# Patient Record
Sex: Female | Born: 1980
Health system: Southern US, Community
[De-identification: ages and names within clinical notes are randomized; demographics above are authoritative.]

## PROBLEM LIST (undated history)

## (undated) ENCOUNTER — Inpatient Hospital Stay: Payer: Self-pay

## (undated) DIAGNOSIS — K559 Vascular disorder of intestine, unspecified: Secondary | ICD-10-CM

## (undated) DIAGNOSIS — Z9289 Personal history of other medical treatment: Secondary | ICD-10-CM

## (undated) DIAGNOSIS — M4306 Spondylolysis, lumbar region: Secondary | ICD-10-CM

## (undated) DIAGNOSIS — G43109 Migraine with aura, not intractable, without status migrainosus: Secondary | ICD-10-CM

## (undated) DIAGNOSIS — F419 Anxiety disorder, unspecified: Secondary | ICD-10-CM

## (undated) DIAGNOSIS — K859 Acute pancreatitis without necrosis or infection, unspecified: Secondary | ICD-10-CM

## (undated) HISTORY — PX: ARTHROSCOPIC REPAIR ACL: SUR80

## (undated) HISTORY — DX: Personal history of other medical treatment: Z92.89

## (undated) HISTORY — PX: ELBOW SURGERY: SHX618

## (undated) HISTORY — DX: Migraine with aura, not intractable, without status migrainosus: G43.109

## (undated) HISTORY — DX: Anxiety disorder, unspecified: F41.9

## (undated) HISTORY — PX: WISDOM TOOTH EXTRACTION: SHX21

---

## 2006-07-31 ENCOUNTER — Emergency Department: Payer: Self-pay | Admitting: Emergency Medicine

## 2006-08-01 ENCOUNTER — Ambulatory Visit: Payer: Self-pay | Admitting: Emergency Medicine

## 2006-08-03 ENCOUNTER — Emergency Department (HOSPITAL_COMMUNITY): Admission: EM | Admit: 2006-08-03 | Discharge: 2006-08-03 | Payer: Self-pay | Admitting: Emergency Medicine

## 2006-08-17 ENCOUNTER — Emergency Department (HOSPITAL_COMMUNITY): Admission: EM | Admit: 2006-08-17 | Discharge: 2006-08-17 | Payer: Self-pay | Admitting: Emergency Medicine

## 2009-01-01 ENCOUNTER — Inpatient Hospital Stay: Payer: Self-pay

## 2009-01-01 ENCOUNTER — Observation Stay: Payer: Self-pay

## 2011-06-16 DIAGNOSIS — Z9289 Personal history of other medical treatment: Secondary | ICD-10-CM

## 2011-06-16 HISTORY — DX: Personal history of other medical treatment: Z92.89

## 2012-02-11 ENCOUNTER — Inpatient Hospital Stay: Payer: Self-pay | Admitting: Obstetrics & Gynecology

## 2012-02-11 LAB — CBC WITH DIFFERENTIAL/PLATELET
Basophil %: 0.5 %
HCT: 39.7 % (ref 35.0–47.0)
HGB: 14.1 g/dL (ref 12.0–16.0)
Lymphocyte %: 9.9 %
MCHC: 35.5 g/dL (ref 32.0–36.0)
MCV: 95 fL (ref 80–100)
Monocyte #: 1 x10 3/mm — ABNORMAL HIGH (ref 0.2–0.9)
Monocyte %: 7.7 %
RDW: 12.9 % (ref 11.5–14.5)

## 2014-05-08 ENCOUNTER — Ambulatory Visit: Payer: Self-pay | Admitting: Nurse Practitioner

## 2014-06-02 ENCOUNTER — Ambulatory Visit: Payer: Self-pay | Admitting: Oncology

## 2014-06-06 ENCOUNTER — Inpatient Hospital Stay: Payer: Self-pay | Admitting: Internal Medicine

## 2014-06-06 LAB — CBC
HCT: 53.7 % — AB (ref 35.0–47.0)
HGB: 18 g/dL — ABNORMAL HIGH (ref 12.0–16.0)
MCH: 31.9 pg (ref 26.0–34.0)
MCHC: 33.5 g/dL (ref 32.0–36.0)
MCV: 95 fL (ref 80–100)
Platelet: 226 10*3/uL (ref 150–440)
RBC: 5.62 10*6/uL — ABNORMAL HIGH (ref 3.80–5.20)
RDW: 13.4 % (ref 11.5–14.5)
WBC: 20.5 10*3/uL — AB (ref 3.6–11.0)

## 2014-06-06 LAB — COMPREHENSIVE METABOLIC PANEL
ALT: 11 U/L — AB
Albumin: 3.2 g/dL — ABNORMAL LOW (ref 3.4–5.0)
Alkaline Phosphatase: 56 U/L
Anion Gap: 8 (ref 7–16)
BILIRUBIN TOTAL: 1.6 mg/dL — AB (ref 0.2–1.0)
BUN: 25 mg/dL — AB (ref 7–18)
CO2: 24 mmol/L (ref 21–32)
CREATININE: 0.96 mg/dL (ref 0.60–1.30)
Calcium, Total: 8.7 mg/dL (ref 8.5–10.1)
Chloride: 101 mmol/L (ref 98–107)
EGFR (African American): >60
GLUCOSE: 114 mg/dL — AB (ref 65–99)
OSMOLALITY: 272 (ref 275–301)
POTASSIUM: 4.2 mmol/L (ref 3.5–5.1)
SGOT(AST): 19 U/L (ref 15–37)
SODIUM: 133 mmol/L — AB (ref 136–145)
TOTAL PROTEIN: 7.4 g/dL (ref 6.4–8.2)

## 2014-06-06 LAB — PROTIME-INR
INR: 1.2
Prothrombin Time: 15.1 secs — ABNORMAL HIGH (ref 11.5–14.7)

## 2014-06-06 LAB — LIPASE, BLOOD: Lipase: 119 U/L (ref 73–393)

## 2014-06-06 LAB — APTT: Activated PTT: 31.7 secs (ref 23.6–35.9)

## 2014-06-06 LAB — HCG, QUANTITATIVE, PREGNANCY: Beta Hcg, Quant.: <1 m[IU]/mL — ABNORMAL LOW

## 2014-06-07 LAB — URINALYSIS, COMPLETE
BLOOD: NEGATIVE
Bilirubin,UR: NEGATIVE
GLUCOSE, UR: NEGATIVE mg/dL (ref 0–75)
NITRITE: NEGATIVE
Ph: 5 (ref 4.5–8.0)
Protein: NEGATIVE
Specific Gravity: >1.06 (ref 1.003–1.030)
Squamous Epithelial: 10
WBC UR: 45 /HPF (ref 0–5)

## 2014-06-07 LAB — CBC WITH DIFFERENTIAL/PLATELET
BASOS ABS: 0 10*3/uL (ref 0.0–0.1)
BASOS ABS: 0 10*3/uL (ref 0.0–0.1)
BASOS ABS: 0 10*3/uL (ref 0.0–0.1)
BASOS PCT: 0.3 %
Basophil %: 0.4 %
Basophil %: 0.3 %
EOS ABS: 0.1 10*3/uL (ref 0.0–0.7)
EOS ABS: 0.1 10*3/uL (ref 0.0–0.7)
EOS PCT: 1 %
EOS PCT: 1.2 %
Eosinophil #: 0.1 10*3/uL (ref 0.0–0.7)
Eosinophil %: 0.8 %
HCT: 38.8 % (ref 35.0–47.0)
HCT: 38.1 % (ref 35.0–47.0)
HCT: 39.3 % (ref 35.0–47.0)
HGB: 12.8 g/dL (ref 12.0–16.0)
HGB: 12.7 g/dL (ref 12.0–16.0)
HGB: 13.3 g/dL (ref 12.0–16.0)
LYMPHS ABS: 1.6 10*3/uL (ref 1.0–3.6)
LYMPHS PCT: 14.8 %
Lymphocyte #: 1.8 10*3/uL (ref 1.0–3.6)
Lymphocyte #: 1.6 10*3/uL (ref 1.0–3.6)
Lymphocyte %: 14.1 %
Lymphocyte %: 16.6 %
MCH: 31.7 pg (ref 26.0–34.0)
MCH: 32 pg (ref 26.0–34.0)
MCH: 32.5 pg (ref 26.0–34.0)
MCHC: 32.8 g/dL (ref 32.0–36.0)
MCHC: 33.3 g/dL (ref 32.0–36.0)
MCHC: 33.9 g/dL (ref 32.0–36.0)
MCV: 97 fL (ref 80–100)
MCV: 96 fL (ref 80–100)
MCV: 96 fL (ref 80–100)
MONO ABS: 1.2 x10 3/mm — AB (ref 0.2–0.9)
MONO ABS: 1 x10 3/mm — AB (ref 0.2–0.9)
MONO ABS: 0.8 x10 3/mm (ref 0.2–0.9)
MONOS PCT: 8.6 %
Monocyte %: 9.5 %
Monocyte %: 8.7 %
NEUTROS ABS: 9.2 10*3/uL — AB (ref 1.4–6.5)
NEUTROS PCT: 74.6 %
NEUTROS PCT: 75.8 %
NEUTROS PCT: 73.3 %
Neutrophil #: 8.6 10*3/uL — ABNORMAL HIGH (ref 1.4–6.5)
Neutrophil #: 7.2 10*3/uL — ABNORMAL HIGH (ref 1.4–6.5)
PLATELETS: 162 10*3/uL (ref 150–440)
PLATELETS: 166 10*3/uL (ref 150–440)
Platelet: 161 10*3/uL (ref 150–440)
RBC: 4.02 10*6/uL (ref 3.80–5.20)
RBC: 3.97 10*6/uL (ref 3.80–5.20)
RBC: 4.1 10*6/uL (ref 3.80–5.20)
RDW: 13.6 % (ref 11.5–14.5)
RDW: 13.5 % (ref 11.5–14.5)
RDW: 13.4 % (ref 11.5–14.5)
WBC: 12.3 10*3/uL — ABNORMAL HIGH (ref 3.6–11.0)
WBC: 11.3 10*3/uL — AB (ref 3.6–11.0)
WBC: 9.9 10*3/uL (ref 3.6–11.0)

## 2014-06-07 LAB — COMPREHENSIVE METABOLIC PANEL
ALK PHOS: 40 U/L — AB
ALT: 8 U/L — AB
Albumin: 2.1 g/dL — ABNORMAL LOW (ref 3.4–5.0)
Anion Gap: 6 — ABNORMAL LOW (ref 7–16)
BUN: 19 mg/dL — ABNORMAL HIGH (ref 7–18)
Bilirubin,Total: 1.5 mg/dL — ABNORMAL HIGH (ref 0.2–1.0)
CALCIUM: 7.2 mg/dL — AB (ref 8.5–10.1)
CHLORIDE: 108 mmol/L — AB (ref 98–107)
CO2: 25 mmol/L (ref 21–32)
Creatinine: 0.94 mg/dL (ref 0.60–1.30)
Glucose: 78 mg/dL (ref 65–99)
OSMOLALITY: 279 (ref 275–301)
Potassium: 3.6 mmol/L (ref 3.5–5.1)
SGOT(AST): 16 U/L (ref 15–37)
SODIUM: 139 mmol/L (ref 136–145)
Total Protein: 5 g/dL — ABNORMAL LOW (ref 6.4–8.2)

## 2014-06-07 LAB — HEMOGLOBIN: HGB: 13.8 g/dL (ref 12.0–16.0)

## 2014-06-08 LAB — BASIC METABOLIC PANEL
ANION GAP: 7 (ref 7–16)
BUN: 7 mg/dL (ref 7–18)
CALCIUM: 7.4 mg/dL — AB (ref 8.5–10.1)
CHLORIDE: 105 mmol/L (ref 98–107)
CO2: 26 mmol/L (ref 21–32)
CREATININE: 0.86 mg/dL (ref 0.60–1.30)
EGFR (African American): >60
EGFR (Non-African Amer.): >60
Glucose: 85 mg/dL (ref 65–99)
Osmolality: 273 (ref 275–301)
Potassium: 3.4 mmol/L — ABNORMAL LOW (ref 3.5–5.1)
SODIUM: 138 mmol/L (ref 136–145)

## 2014-06-08 LAB — CBC WITH DIFFERENTIAL/PLATELET
BASOS ABS: 0 10*3/uL (ref 0.0–0.1)
Basophil %: 0.2 %
EOS ABS: 0.1 10*3/uL (ref 0.0–0.7)
Eosinophil %: 1.1 %
HCT: 36.8 % (ref 35.0–47.0)
HGB: 12.4 g/dL (ref 12.0–16.0)
Lymphocyte #: 1.2 10*3/uL (ref 1.0–3.6)
Lymphocyte %: 16.6 %
MCH: 32.5 pg (ref 26.0–34.0)
MCHC: 33.6 g/dL (ref 32.0–36.0)
MCV: 97 fL (ref 80–100)
MONO ABS: 0.7 x10 3/mm (ref 0.2–0.9)
Monocyte %: 9.1 %
Neutrophil #: 5.4 10*3/uL (ref 1.4–6.5)
Neutrophil %: 73 %
Platelet: 148 10*3/uL — ABNORMAL LOW (ref 150–440)
RBC: 3.8 10*6/uL (ref 3.80–5.20)
RDW: 13.3 % (ref 11.5–14.5)
WBC: 7.5 10*3/uL (ref 3.6–11.0)

## 2014-06-08 LAB — HEPATIC FUNCTION PANEL A (ARMC)
Albumin: 2.3 g/dL — ABNORMAL LOW (ref 3.4–5.0)
Alkaline Phosphatase: 38 U/L — ABNORMAL LOW
BILIRUBIN TOTAL: 0.8 mg/dL (ref 0.2–1.0)
Bilirubin, Direct: 0.1 mg/dL (ref 0.0–0.2)
SGOT(AST): 12 U/L — ABNORMAL LOW (ref 15–37)
SGPT (ALT): 8 U/L — ABNORMAL LOW
Total Protein: 5.5 g/dL — ABNORMAL LOW (ref 6.4–8.2)

## 2014-06-09 LAB — BASIC METABOLIC PANEL
Anion Gap: 6 — ABNORMAL LOW (ref 7–16)
BUN: 8 mg/dL (ref 7–18)
CALCIUM: 7.8 mg/dL — AB (ref 8.5–10.1)
CO2: 28 mmol/L (ref 21–32)
Chloride: 108 mmol/L — ABNORMAL HIGH (ref 98–107)
Creatinine: 0.85 mg/dL (ref 0.60–1.30)
EGFR (African American): >60
EGFR (Non-African Amer.): >60
Glucose: 84 mg/dL (ref 65–99)
OSMOLALITY: 281 (ref 275–301)
POTASSIUM: 3.4 mmol/L — AB (ref 3.5–5.1)
SODIUM: 142 mmol/L (ref 136–145)

## 2014-06-09 LAB — URINE CULTURE

## 2014-06-09 LAB — SEDIMENTATION RATE: Erythrocyte Sed Rate: 46 mm/hr — ABNORMAL HIGH (ref 0–20)

## 2014-06-10 LAB — BASIC METABOLIC PANEL
ANION GAP: 8 (ref 7–16)
BUN: 8 mg/dL (ref 7–18)
CHLORIDE: 106 mmol/L (ref 98–107)
Calcium, Total: 8.1 mg/dL — ABNORMAL LOW (ref 8.5–10.1)
Co2: 28 mmol/L (ref 21–32)
Creatinine: 0.94 mg/dL (ref 0.60–1.30)
EGFR (African American): >60
Glucose: 79 mg/dL (ref 65–99)
OSMOLALITY: 280 (ref 275–301)
Potassium: 3.4 mmol/L — ABNORMAL LOW (ref 3.5–5.1)
Sodium: 142 mmol/L (ref 136–145)

## 2014-06-11 LAB — CULTURE, BLOOD (SINGLE)

## 2014-06-12 LAB — CBC WITH DIFFERENTIAL/PLATELET
BASOS PCT: 1 %
Basophil #: 0 10*3/uL (ref 0.0–0.1)
EOS PCT: 5.6 %
Eosinophil #: 0.3 10*3/uL (ref 0.0–0.7)
HCT: 37.1 % (ref 35.0–47.0)
HGB: 12.8 g/dL (ref 12.0–16.0)
LYMPHS ABS: 1.3 10*3/uL (ref 1.0–3.6)
Lymphocyte %: 26.6 %
MCH: 32.2 pg (ref 26.0–34.0)
MCHC: 34.5 g/dL (ref 32.0–36.0)
MCV: 93 fL (ref 80–100)
MONO ABS: 0.8 x10 3/mm (ref 0.2–0.9)
Monocyte %: 16.3 %
Neutrophil #: 2.4 10*3/uL (ref 1.4–6.5)
Neutrophil %: 50.5 %
PLATELETS: 283 10*3/uL (ref 150–440)
RBC: 3.97 10*6/uL (ref 3.80–5.20)
RDW: 12.8 % (ref 11.5–14.5)
WBC: 4.7 10*3/uL (ref 3.6–11.0)

## 2014-06-13 ENCOUNTER — Ambulatory Visit: Payer: Self-pay | Admitting: Oncology

## 2014-06-13 LAB — COMPREHENSIVE METABOLIC PANEL
ALK PHOS: 39 U/L — AB
ANION GAP: 5 — AB (ref 7–16)
Albumin: 2.5 g/dL — ABNORMAL LOW (ref 3.4–5.0)
BILIRUBIN TOTAL: 0.4 mg/dL (ref 0.2–1.0)
BUN: 10 mg/dL (ref 7–18)
CALCIUM: 8.1 mg/dL — AB (ref 8.5–10.1)
CHLORIDE: 106 mmol/L (ref 98–107)
CO2: 29 mmol/L (ref 21–32)
CREATININE: 1.03 mg/dL (ref 0.60–1.30)
Glucose: 86 mg/dL (ref 65–99)
OSMOLALITY: 278 (ref 275–301)
POTASSIUM: 3.7 mmol/L (ref 3.5–5.1)
SGOT(AST): 12 U/L — ABNORMAL LOW (ref 15–37)
SGPT (ALT): 10 U/L — ABNORMAL LOW
SODIUM: 140 mmol/L (ref 136–145)
Total Protein: 6 g/dL — ABNORMAL LOW (ref 6.4–8.2)

## 2014-06-13 LAB — PHOSPHORUS: Phosphorus: 3.9 mg/dL (ref 2.5–4.9)

## 2014-06-13 LAB — MAGNESIUM: MAGNESIUM: 2.1 mg/dL

## 2014-06-18 ENCOUNTER — Inpatient Hospital Stay: Payer: Self-pay | Admitting: Internal Medicine

## 2014-06-18 LAB — COMPREHENSIVE METABOLIC PANEL
ALT: 38 U/L
AST: 47 U/L — AB (ref 15–37)
Albumin: 3.9 g/dL (ref 3.4–5.0)
Alkaline Phosphatase: 53 U/L
Anion Gap: 9 (ref 7–16)
BILIRUBIN TOTAL: 0.3 mg/dL (ref 0.2–1.0)
BUN: 16 mg/dL (ref 7–18)
CALCIUM: 9.6 mg/dL (ref 8.5–10.1)
CHLORIDE: 102 mmol/L (ref 98–107)
CO2: 28 mmol/L (ref 21–32)
CREATININE: 0.96 mg/dL (ref 0.60–1.30)
EGFR (African American): >60
EGFR (Non-African Amer.): >60
Glucose: 95 mg/dL (ref 65–99)
OSMOLALITY: 279 (ref 275–301)
Potassium: 4.1 mmol/L (ref 3.5–5.1)
Sodium: 139 mmol/L (ref 136–145)
TOTAL PROTEIN: 8.4 g/dL — AB (ref 6.4–8.2)

## 2014-06-18 LAB — CBC
HCT: 47.3 % — AB (ref 35.0–47.0)
HGB: 16.2 g/dL — ABNORMAL HIGH (ref 12.0–16.0)
MCH: 31.8 pg (ref 26.0–34.0)
MCHC: 34.3 g/dL (ref 32.0–36.0)
MCV: 93 fL (ref 80–100)
Platelet: 472 10*3/uL — ABNORMAL HIGH (ref 150–440)
RBC: 5.11 10*6/uL (ref 3.80–5.20)
RDW: 13 % (ref 11.5–14.5)
WBC: 7.9 10*3/uL (ref 3.6–11.0)

## 2014-06-18 LAB — URINALYSIS, COMPLETE
Bacteria: NONE SEEN
Bilirubin,UR: NEGATIVE
Blood: NEGATIVE
Glucose,UR: NEGATIVE mg/dL (ref 0–75)
Ketone: NEGATIVE
LEUKOCYTE ESTERASE: NEGATIVE
Nitrite: NEGATIVE
PH: 7 (ref 4.5–8.0)
PROTEIN: NEGATIVE
Specific Gravity: 1.005 (ref 1.003–1.030)
Squamous Epithelial: 1
WBC UR: NONE SEEN /HPF (ref 0–5)

## 2014-06-18 LAB — LIPASE, BLOOD: LIPASE: 1030 U/L — AB (ref 73–393)

## 2014-06-19 LAB — LIPASE, BLOOD: LIPASE: 595 U/L — AB (ref 73–393)

## 2014-06-20 LAB — LIPASE, BLOOD: LIPASE: 625 U/L — AB (ref 73–393)

## 2014-06-20 LAB — TRIGLYCERIDES: Triglycerides: 129 mg/dL (ref 0–200)

## 2014-06-21 LAB — LIPASE, BLOOD: LIPASE: 648 U/L — AB (ref 73–393)

## 2014-06-22 LAB — BASIC METABOLIC PANEL
ANION GAP: 11 (ref 7–16)
BUN: 9 mg/dL (ref 7–18)
CO2: 22 mmol/L (ref 21–32)
CREATININE: 0.85 mg/dL (ref 0.60–1.30)
Calcium, Total: 8.5 mg/dL (ref 8.5–10.1)
Chloride: 105 mmol/L (ref 98–107)
Glucose: 73 mg/dL (ref 65–99)
OSMOLALITY: 273 (ref 275–301)
Potassium: 3.9 mmol/L (ref 3.5–5.1)
SODIUM: 138 mmol/L (ref 136–145)

## 2014-06-23 LAB — CBC WITH DIFFERENTIAL/PLATELET
BASOS PCT: 0.4 %
Basophil #: 0 10*3/uL (ref 0.0–0.1)
EOS ABS: 0 10*3/uL (ref 0.0–0.7)
EOS PCT: 0.5 %
HCT: 36.4 % (ref 35.0–47.0)
HGB: 12.6 g/dL (ref 12.0–16.0)
LYMPHS PCT: 9.1 %
Lymphocyte #: 0.8 10*3/uL — ABNORMAL LOW (ref 1.0–3.6)
MCH: 34.3 pg — ABNORMAL HIGH (ref 26.0–34.0)
MCHC: 34.7 g/dL (ref 32.0–36.0)
MCV: 99 fL (ref 80–100)
MONOS PCT: 7.4 %
Monocyte #: 0.6 x10 3/mm (ref 0.2–0.9)
NEUTROS ABS: 6.9 10*3/uL — AB (ref 1.4–6.5)
NEUTROS PCT: 82.6 %
Platelet: 317 10*3/uL (ref 150–440)
RBC: 3.69 10*6/uL — AB (ref 3.80–5.20)
RDW: 12.3 % (ref 11.5–14.5)
WBC: 8.4 10*3/uL (ref 3.6–11.0)

## 2014-06-23 LAB — SEDIMENTATION RATE: ERYTHROCYTE SED RATE: 48 mm/h — AB (ref 0–20)

## 2014-06-23 LAB — WBC: WBC: 4.7 10*3/uL (ref 3.6–11.0)

## 2014-06-24 LAB — LIPASE, BLOOD: Lipase: 780 U/L — ABNORMAL HIGH (ref 73–393)

## 2014-06-25 LAB — COMPREHENSIVE METABOLIC PANEL
ANION GAP: 6 — AB (ref 7–16)
Albumin: 2.9 g/dL — ABNORMAL LOW (ref 3.4–5.0)
Alkaline Phosphatase: 33 U/L — ABNORMAL LOW
BILIRUBIN TOTAL: 0.6 mg/dL (ref 0.2–1.0)
BUN: 7 mg/dL (ref 7–18)
CHLORIDE: 105 mmol/L (ref 98–107)
CO2: 29 mmol/L (ref 21–32)
Calcium, Total: 8.6 mg/dL (ref 8.5–10.1)
Creatinine: 0.86 mg/dL (ref 0.60–1.30)
EGFR (Non-African Amer.): >60
Glucose: 71 mg/dL (ref 65–99)
OSMOLALITY: 276 (ref 275–301)
POTASSIUM: 3.8 mmol/L (ref 3.5–5.1)
SGOT(AST): 32 U/L (ref 15–37)
SGPT (ALT): 25 U/L
Sodium: 140 mmol/L (ref 136–145)
TOTAL PROTEIN: 6 g/dL — AB (ref 6.4–8.2)

## 2014-06-25 LAB — LIPASE, BLOOD: Lipase: 748 U/L — ABNORMAL HIGH (ref 73–393)

## 2014-06-26 LAB — LIPASE, BLOOD: Lipase: 840 U/L — ABNORMAL HIGH (ref 73–393)

## 2014-06-27 LAB — COMPREHENSIVE METABOLIC PANEL
ALT: 14 U/L (ref 14–63)
AST: 25 U/L (ref 15–37)
Albumin: 2.6 g/dL — ABNORMAL LOW (ref 3.4–5.0)
Alkaline Phosphatase: 33 U/L — ABNORMAL LOW (ref 46–116)
Anion Gap: 6 — ABNORMAL LOW (ref 7–16)
BUN: 6 mg/dL — ABNORMAL LOW (ref 7–18)
Bilirubin,Total: 0.4 mg/dL (ref 0.2–1.0)
CHLORIDE: 108 mmol/L — AB (ref 98–107)
CO2: 30 mmol/L (ref 21–32)
Calcium, Total: 8.6 mg/dL (ref 8.5–10.1)
Creatinine: 0.96 mg/dL (ref 0.60–1.30)
Glucose: 92 mg/dL (ref 65–99)
Osmolality: 284 (ref 275–301)
Potassium: 4 mmol/L (ref 3.5–5.1)
Sodium: 144 mmol/L (ref 136–145)
Total Protein: 5.6 g/dL — ABNORMAL LOW (ref 6.4–8.2)

## 2014-06-27 LAB — LIPASE, BLOOD: Lipase: 721 U/L — ABNORMAL HIGH (ref 73–393)

## 2014-06-27 LAB — PREGNANCY, URINE: Pregnancy Test, Urine: NEGATIVE m[IU]/mL

## 2014-06-27 LAB — CULTURE, BLOOD (SINGLE)

## 2014-07-03 ENCOUNTER — Ambulatory Visit: Payer: Self-pay | Admitting: Oncology

## 2014-09-25 LAB — SURGICAL PATHOLOGY

## 2014-10-01 NOTE — Consult Note (Signed)
Pt seen.  See consult note from Dawson Bills.  Pt bowel sounds are good at this time, mild diffuse tenderness, afebrile and WBCD coming down.  Will follow with you.   Electronic Signatures: Manya Silvas (MD) (Signed on 06-Jan-16 19:45)  Authored   Last Updated: 06-Jan-16 19:45 by Manya Silvas (MD)

## 2014-10-01 NOTE — Consult Note (Signed)
Pt doing better, is going home, will contact me next week for follow up.  Electronic Signatures: Manya Silvas (MD)  (Signed on 15-Jan-16 16:47)  Authored  Last Updated: 15-Jan-16 16:47 by Manya Silvas (MD)

## 2014-10-01 NOTE — Consult Note (Signed)
CHIEF COMPLAINT and HISTORY:  Subjective/Chief Complaint recurrent abdominal pain   History of Present Illness Patient who was admitted for over a week and discharged last week with ischemic colitis.  Admitted with recurrent abdominal pain and now with elevated lipase consistent with chemical pancreatitis.  She feels a little better with hydration since admission.  WBC normal.  Bowels more regular than previous bout.  Had been eating OK at discharge.  Had a RUQ [place] which was unrevealing.  Lipase is down to 500s from 1000 on admission.  We are asked again to assess perfusion as a possible issue.   PAST MEDICAL/SURGICAL HISTORY:  Past Medical History:   Ischemic Colitis:    Pancreatitis:    Knee Surgery - Left:    Other, see comments:   ALLERGIES:  Allergies:  Sulfa drugs: Swelling  HOME MEDICATIONS:  Home Medications: Medication Instructions Status  acetaminophen-HYDROcodone 325 mg-5 mg oral tablet 1 tab(s) orally every 4 hours as needed for moderate pain Active  Prenatal Multivitamins 1 tab(s) orally once a day Active   Family and Social History:  Family History great grandmother with aneurysms, grandmother with Wegner's and ESRD   Social History negative tobacco, negative ETOH, negative Illicit drugs   Place of Living Home   Review of Systems:  Subjective/Chief Complaint No TIA/stroke/seizure No heat or cold intolerance No dysuria/hematuria No blurry or double vision No tinnitus or ear pain No rashes or ulcer No suicidal ideation or psychosis No signs of bleeding or easy bruising No SOB/DOE, orthopnea, or sputum No palpitations or chest pain Positive for abdominal pain No joint pain or joint swelling No fever or chills No unintentional weight loss or gain   Medications/Allergies Reviewed Medications/Allergies reviewed   Physical Exam:  GEN well developed, well nourished, no acute distress   HEENT pink conjunctivae, hearing intact to voice   NECK No masses   trachea midline   RESP normal resp effort  no use of accessory muscles   CARD No LE edema  no JVD   VASCULAR ACCESS none   ABD positive tenderness  soft   LYMPH negative neck, negative axillae   EXTR negative cyanosis/clubbing, negative edema   SKIN No rashes, No ulcers, skin turgor good   NEURO cranial nerves intact, follows commands, motor/sensory function intact   PSYCH alert, A+O to time, place, person, good insight   LABS:  Laboratory Results: Hepatic:    17-Jan-16 11:16, Comprehensive Metabolic Panel  Bilirubin, Total 0.3  Alkaline Phosphatase 53  46-116  NOTE: New Reference Range  12/20/13  SGPT (ALT) 38  14-63  NOTE: New Reference Range  12/20/13  SGOT (AST) 47  Total Protein, Serum 8.4  Albumin, Serum 3.9  Routine Chem:  Glucose, Serum 95  BUN 16  Creatinine (comp) 0.96  Sodium, Serum 139  Potassium, Serum 4.1  Chloride, Serum 102  CO2, Serum 28  Calcium (Total), Serum 9.6  Osmolality (calc) 279  eGFR (African American) >60  eGFR (Non-African American) >60  eGFR values <56mL/min/1.73 m2 may be an indication of chronic  kidney disease (CKD).  Calculated eGFR, using the MRDR Study equation, is useful in   patients with stable renal function.  The eGFR calculation will not be reliable in acutely ill patients  when serum creatinine is changing rapidly. It is not useful in  patients on dialysis. The eGFR calculation may not be applicable  to patients at the low and high extremes of body sizes, pregnant  women, and vegetarians.  Anion Gap 9  17-Jan-16 11:16, Lipase  Lipase 1030  Result(s) reported on 18 Jun 2014 at 11:43AM.    18-Jan-16 04:05, Lipase  Lipase 595  Result(s) reported on 19 Jun 2014 at 04:47AM.  Routine UA:    17-Jan-16 12:10, Urinalysis  Color (UA) Straw  Clarity (UA) Clear  Glucose (UA) Negative  Bilirubin (UA) Negative  Ketones (UA) Negative  Specific Gravity (UA) 1.005  Blood (UA) Negative  pH (UA) 7.0  Protein (UA)  Negative  Nitrite (UA) Negative  Leukocyte Esterase (UA) Negative  Result(s) reported on 18 Jun 2014 at 12:29PM.  RBC (UA) <1 /HPF  WBC (UA)   NONE SEEN  Bacteria (UA)   NONE SEEN  Epithelial Cells (UA) 1 /HPF  Result(s) reported on 18 Jun 2014 at 12:29PM.  Routine Hem:    17-Jan-16 11:16, Hemogram, Platelet Count  WBC (CBC) 7.9  RBC (CBC) 5.11  Hemoglobin (CBC) 16.2  Hematocrit (CBC) 47.3  Platelet Count (CBC) 472  Result(s) reported on 18 Jun 2014 at 11:33AM.  MCV 93  MCH 31.8  MCHC 34.3  RDW 13.0   RADIOLOGY:  Radiology Results: XRay:    05-Jan-16 18:45, Chest 1 View AP or PA  Chest 1 View AP or PA  REASON FOR EXAM:    severe abd pain, upright portable please, eval for   FREE AIR  COMMENTS:   May transport without cardiac monitor    PROCEDURE: DXR - DXR CHEST 1 VIEWAP OR PA  - Jun 06 2014  6:45PM     CLINICAL DATA:  severe abdominal pain since this morning was told  she has large amount of fluid in her abdomen. shielded. no chest  complaints    EXAM:  CHEST - 1 VIEW    COMPARISON:  None available  FINDINGS:  Lungs are clear. Heart size and mediastinal contours are within  normal limits.  No effusion.  Visualized skeletal structures are unremarkable.     IMPRESSION:  No acute cardiopulmonary disease.      Electronically Signed    By: Arne Cleveland M.D.    On: 06/06/2014 19:08       Verified By: Kandis Cocking, M.D.,    07-Jan-16 11:12, Abdomen 3 Way Includes PA Chest  Abdomen 3 Way Includes PA Chest  REASON FOR EXAM:    abdomen distention, sob  COMMENTS:       PROCEDURE: DXR - DXR ABDOMEN 3-WAY (INCL PA CXR)  - Jun 08 2014 11:12AM     CLINICAL DATA:  Initial evaluation for left-sided abdominal pain  beginning 3 days ago, shortness of breath, current history colitis    EXAM:  ABDOMEN SERIES    COMPARISON:  06/06/2014    FINDINGS:  Heart size is normal. Vascular pattern is normal. There is  interstitial prominence at both lung bases with  a tiny right  effusion. There is no free air.    There are no abnormally dilated loops of bowel. However, there is  air-fluid level in the proximal descending colon, with little gas  distal to this.     IMPRESSION:  1. Bowel gas pattern is nonspecific but consistent with known  left-sided colitis  2. Development of interstitial prominence both lung bases with tiny  right pleural effusion. Developing pneumonia not excluded.      Electronically Signed    By: Skipper Cliche M.D.    On: 06/08/2014 11:38         Verified By: Rachael Fee, M.D.,  [place]:  08-Jan-16 11:53, US Abdomen Limited Survey  US Abdomen Limited Survey  REASON FOR EXAM:    abdominal distention eval for Ascites  COMMENTS:       PROCEDURE: [place]  - US ABDOMEN LIMITED SURVEY  - Jun 09 2014 11:53AM     CLINICAL DATA:  Abdominal distension, possible ascites    EXAM:  US ABDOMEN LIMITED - RIGHT UPPER QUADRANT    COMPARISON:  None.    FINDINGS:  Limited abdominal ultrasound demonstrates no evidence of significant  ascites. Only trace ascites noted in right lower quadrant.   IMPRESSION:  Only trace ascites is noted in right lower quadrant of the abdomen.      Electronically Signed    By: Lahoma Crocker M.D.    On: 06/09/2014 11:57         Verified By: Ephraim Hamburger, M.D.,    17-Jan-16 16:24, US Abdomen Limited Survey  US Abdomen Limited Survey  REASON FOR EXAM:    Upper abd pain  COMMENTS:   May transport without cardiac monitor    PROCEDURE: [place]  - US ABDOMEN LIMITED SURVEY  - Jun 18 2014  4:24PM     CLINICAL DATA:  Upper abdominal pain.    EXAM:  US ABDOMEN LIMITED - RIGHT UPPER QUADRANT    COMPARISON:  06/11/2014 CT    FINDINGS:  Gallbladder:  No gallstones or wall thickening visualized. No sonographic Murphy  sign noted.    Common bile duct:    Diameter: Normal caliber, 5 mm    Liver:    No focal lesion identified. Within normal limits in parenchymal  echogenicity.      IMPRESSION:  Unremarkable right upper quadrant ultrasound.  Electronically Signed    By: Rolm Baptise M.D.    On: 06/18/2014 16:41         Verified By: Raelyn Number, M.D.,  [place]:    07-Dec-15 11:03, [id number]  [id number]  REASON FOR EXAM:    back pain, Fax results [id number]  COMMENTS:       PROCEDURE: KDR - KDXR LUMBAR SPINE AP AND LATERAL  - May 08 2014 11:03AM     CLINICAL DATA:  Back pain for multiple years, worsening back pain  last 2 weeks, history of pars defect    EXAM:  LUMBAR SPINE - 2-3 VIEW    COMPARISON:  None.    FINDINGS:  Three views of lumbar spine submitted. No acute fracture. There is  bilateral pars defect at L5 level. Moderate disc space flattening at  L5-S1 level. There is about 1 cm anterolisthesis L5 on S1 vertebral  body.     IMPRESSION:  Moderate disc space flattening at L5-S1 level. About 1 cm  anterolisthesis L5 on S1 vertebral body. Bilateral pars defect at L5  level. Alignment and vertebral body heights are preserved. No acute  fracture.      Electronically Signed    By: Lahoma Crocker M.D.    On: 05/08/2014 12:17     Verified By: Ephraim Hamburger, M.D.,    05-Jan-16 18:45, Chest 1 View AP or PA  PACS Image    05-Jan-16 19:18, CT Abdomen and Pelvis With Contrast  PACS Image    07-Jan-16 11:12, Abdomen 3 Way Includes PA Chest  PACS Image    10-Jan-16 11:33, CT Abdomen and Pelvis With Contrast  PACS Image    17-Jan-16 16:24, US Abdomen Limited Survey  PACS Image  CT:    05-Jan-16 19:18, CT Abdomen  and Pelvis With Contrast  CT Abdomen and Pelvis With Contrast  REASON FOR EXAM:    (1) NO PO. SEVERE LLQ pain with porbable   hemoperitoneum; (2) NO PO. NO PO. SEVER  COMMENTS:   May transport without cardiac monitor    PROCEDURE: CT  - CT ABDOMEN / PELVIS  W  - Jun 06 2014  7:18PM     CLINICAL DATA:  Fever and pain. Fluid in abdomen on pelvic  ultrasound. Lower abdominal pain. Severe left lower quadrant pain  with probable  hemoperitoneum.    EXAM:  CT ABDOMEN AND PELVIS WITH CONTRAST    TECHNIQUE:  Multidetector CT imaging of the abdomen and pelvis was performed  using the standard protocol following bolus administration of  intravenous contrast.    CONTRAST:  100 cc Omnipaque 300    COMPARISON:  None.    FINDINGS:  Lower chest: Mild interstitial thickening at the lung bases,  possibly related to fluid overload. Normal heart size. Trace  pericardial fluid. No pleural fluid.    Hepatobiliary: Mild hepatic steatosis. Focal steatosis adjacent the  falciform ligament. Normal gallbladder, without biliary ductal  dilatation.  Pancreas: Normal, without mass or pancreatic ductal dilatation.    Spleen: Normal    Adrenals/Urinary Tract: Normal adrenal glands. Normal kidneys,  without hydronephrosis. Normal urinary bladder.    Stomach/Bowel: Normal stomach, without wall thickening. Rectum and  sigmoid are within normal limits. Moderate to marked wall thickening  involves the transverse colon and splenic flexure. Example images 41  and 42. Normal terminal ileum. Appendix not well visualized.    Small bowel loops are positioned within ventral abdominal wall  laxity, including on image 39. No obstruction or other acute  complication.  Vascular/Lymphatic: Normal appearance of the abdominal aorta and  branch vessels. Mildly prominent gastric hepatic ligament nodes are  likely related to steatosis. Example at 11 mm on image 17. No pelvic  adenopathy.    Reproductive: Normal uterus. Suspect a right ovarian corpus luteal  cyst at 9 mm on image 60 of series 2.    Other: Small volume perihepatic fluid appears simple. There is also  small to moderate volume pelvic fluid. No evidence of drainable  abscess. There is thickening of the peritoneal surface within the  cul-de-sac, including on image 67. This is mild. No evidence of  hematoma or intraperitoneal hemorrhage.    Musculoskeletal: L5 bilateral pars  defects. Grade 1 L5-S1  anterolisthesis.     IMPRESSION:  1. Moderate wall thickening involving the transverse and splenic  flexure colon. Most consistent with infectious colitis.  2. Right ovarian corpus luteal cyst.  3. Trace abdominal and small volume pelvic fluid, greater than  physiologic. Concurrent mild peritoneal thickening. Suspect fluid  secondary to colitis. Cannot exclude infected ascites. Less likely,  this fluid with peritoneal thickening could be secondary to recent  cyst rupture.No evidence of abscess.  4. Small bowel within nonobstructive ventral abdominal wall laxity.  5. Trace pericardial fluid with lung base septal thickening.  Question fluid overload.  Electronically Signed    By: Abigail Miyamoto M.D.    On: 06/06/2014 19:34         Verified By: Areta Haber, M.D.,    10-Jan-16 11:33, CT Abdomen and Pelvis With Contrast  CT Abdomen and Pelvis With Contrast  REASON FOR EXAM:    (1) abd pain; (2) abd pain  COMMENTS:       PROCEDURE: CT  - CT ABDOMEN / PELVIS  W  - Jun 11 2014 11:33AM     CLINICAL DATA:  Abdominal pain, diarrhea, bloating, fever    EXAM:  CT ABDOMEN AND PELVIS WITH CONTRAST    TECHNIQUE:  Multidetector CT imaging of the abdomen and pelvis was performed  using the standard protocol following bolus administration of  intravenous contrast.  CONTRAST:  100 mL Omnipaque 300 IV    COMPARISON:  06/06/2014    FINDINGS:  Lower chest: Again noted is interlobular septal thickening at the  lung bases. Minimal/trace left pleural fluid.    Hepatobiliary: Liver is notable for focal fat/ altered perfusion  along the falciform ligament.    Gallbladder is unremarkable. No intrahepatic or extrahepatic ductal  dilatation.    Pancreas: Within normal limits.  Spleen: Within normal limits.    Adrenals/Urinary Tract: Adrenal glands are unremarkable.    Kidneys are within normal limits.  No hydronephrosis.    Bladder is within normal  limits.    Stomach/Bowel: Stomach is unremarkable.    No evidence of bowel obstruction.    Normal appendix.    Again noted is wall thickening involving the distal transverse colon  and splenic flexure, suggesting infectious/ inflammatory colitis.    No drainable fluid collection/abscess.  No free air.    Vascular/Lymphatic: No evidence of abdominal aortic aneurysm.    No suspicious abdominopelvic lymphadenopathy.    Reproductive: Uterus is unremarkable.    Bilateral ovaries are within normal limits.    Other: Smallvolume pelvic ascites.    Musculoskeletal: Grade 1 spondylolisthesis at L5-S1.   IMPRESSION:  Wall thickening involving the distal transverse colon and splenic  flexure, suggesting infectious/inflammatory colitis.    No drainable fluid collection/abscess.  No free air.    Small volume pelvic ascites, likely reactive.      Electronically Signed    By: Julian Hy M.D.    On: 06/11/2014 12:57         Verified By: Julian Hy, M.D.,   ASSESSMENT AND PLAN:  Assessment/Admission Diagnosis ischemic colitis, recurrent abdominal pain after discharge Pancreatitis, at least chemical.  ischemic pancreatitis very rare, but if there is some SMA abnormality such as dissection this would be possible.  May just be from surrounding inflammation for colitis.   Plan Had a long talk today with patient and multiple family members including husband.  she has a very unusual presentation and there is not a great explanation for her issues.  She had a previous CT x 2 which I reviewed, and these were regular 5 mm cut CT not CT angiogram, but this showed no obvious issues.  At this point, from a vascular POV I think it is reasonable to consider angiogram to look for subtle issues such as branch dissection.  This may also give an idea if there is some congenital issue of poor blood flow/lack or branches to the colon near the splenic flexure.  The SMa portion of the angiogram  may also give information about the pancreas, and a celiac angiogram can be performed as well if pancreas needs further evaluation.  Risks of angiogram are small and these were discussed with patient.  Intervention may be possible, but we discussed this was quite unlikely.  The treatment options may be better elucidated with angiogram though if we find one of these issues.  She and family are very desirous to proceed.  this will be scheduled for Wednesday.   level 4 consult   Electronic Signatures: Algernon Huxley (MD)  (Signed [phone]  17:10)  Authored: Chief Complaint and History, PAST MEDICAL/SURGICAL HISTORY, ALLERGIES, HOME MEDICATIONS, Family and Social History, Review of Systems, Physical Exam, LABS, RADIOLOGY, Assessment and Plan   Last Updated: 18-Jan-16 17:10 by Algernon Huxley (MD)

## 2014-10-01 NOTE — Consult Note (Signed)
Pt with abd pain but not quite as severe.  Able to cough with minimal pain, still requiring pain meds around the clock.  Can't rule out acute infection, can't rule out acute ischemic colitis associated with running.  Pt with some coughing and difficulty taking a deep breath, may be related to small pleural effusions.  WBC came down and temp not elevated.  Will start yogurt tomorrow.  May need repeat CT scan in 6-7 days after first.  Electronic Signatures: Manya Silvas (MD)  (Signed on 07-Jan-16 18:36)  Authored  Last Updated: 07-Jan-16 18:36 by Manya Silvas (MD)

## 2014-10-01 NOTE — Consult Note (Signed)
Pt seen yesterday but note not done accidently.  Pt with continued left upper abd tenderness, lipase of 600, to have arteriographic study with Dr, Lucky Cowboy.  Plan for MRI tomorrow.  She has been ill for 2 weeks and need to consider nutrition supplementation, possible PIC line with TPN.   Electronic Signatures: Manya Silvas (MD)  (Signed on 20-Jan-16 08:44)  Authored  Last Updated: 20-Jan-16 08:44 by Manya Silvas (MD)

## 2014-10-01 NOTE — Consult Note (Signed)
CHIEF COMPLAINT and HISTORY:  Subjective/Chief Complaint abdominal pain   History of Present Illness Patient is a previously healthy 34 yo female admitted last week with left sided colitis.  She had abrupt onset of severe abdominal pain, N/V/D that lasted about 2 days prompting admission.  Two CT scans have been done and I have reviewed these.  She has been treated for colitis, and her colonoscopy shows biopsy proven ischemic colitis.  She has been improving and feels much better now, although pain has not entirely resolved. Only threw up once, but had nausea and poor appetite for some time.  Her CTs were not CTA, but her aorta and visceral vessels are all well seen and no obvious abnormalities are present.  No signs of dissection or aneurysms.  Flow is present and appears pretty good.  No obvious signs of mesenteric venous thrombosis either.  No previous clotting issues or abdominal history.   PAST MEDICAL/SURGICAL HISTORY:  Past Medical History:   Knee Surgery - Left:    Other, see comments:   ALLERGIES:  Allergies:  Sulfa drugs: Swelling  HOME MEDICATIONS:  Home Medications: Medication Instructions Status  Vitamin D3 1 tab(s) orally once a day Active  biotin 1 tab(s) orally once a day Active  multivitamin 1 tab(s) orally once a day Active   Family and Social History:  Family History grandmother has Regulatory affairs officer, [place] grandmother had an aneurysm   Social History negative tobacco, negative ETOH, negative Illicit drugs   Review of Systems:  Subjective/Chief Complaint No TIA/stroke/seizure No heat or cold intolerance No dysuria/hematuria No blurry or double vision No tinnitus or ear pain No rashes or ulcer No suicidal ideation or psychosis No signs of bleeding or easy bruising No SOB/DOE, orthopnea, or sputum No palpitations or chest pain abdominal pain and N/V/D per HPI No joint pain or joint swelling No fever or chills No unintentional weight loss or gain   Abdominal Pain  Yes   Diarrhea Yes   Nausea/Vomiting Yes   Medications/Allergies Reviewed Medications/Allergies reviewed   Physical Exam:  GEN well developed, well nourished, no acute distress   HEENT pink conjunctivae, hearing intact to voice, moist oral mucosa   NECK No masses  trachea midline   RESP normal resp effort  no use of accessory muscles   CARD regular rate  no JVD   VASCULAR ACCESS none   ABD positive tenderness  soft   GU no superpubic tenderness   LYMPH negative neck, negative axillae   EXTR negative cyanosis/clubbing, negative edema   SKIN normal to palpation, skin turgor good   NEURO cranial nerves intact, follows commands, motor/sensory function intact   PSYCH alert, A+O to time, place, person, good insight   LABS:  Laboratory Results: LabObservation:    08-Jan-16 11:53, US Abdomen Limited Survey  OBSERVATION   PACS Image dcm.[id number].sa=[id number]  Hepatic:    05-Jan-16 18:27, Comprehensive Metabolic Panel  Bilirubin, Total 1.6  Alkaline Phosphatase 56  46-116  NOTE: New Reference Range  12/20/13  SGPT (ALT) 11  14-63  NOTE: New Reference Range  12/20/13  SGOT (AST) 19  Total Protein, Serum 7.4  Albumin, Serum 3.2    06-Jan-16 05:00, Comprehensive Metabolic Panel  Bilirubin, Total 1.5  Alkaline Phosphatase 40  46-116  NOTE: New Reference Range  12/20/13  SGPT (ALT) 8  14-63  NOTE: New Reference Range  12/20/13  SGOT (AST) 16  Total Protein, Serum 5.0  Albumin, Serum 2.1    07-Jan-16 04:41, Hepatic Function Panel A  Bilirubin, Total 0.8  Bilirubin, Direct 0.1  Result(s) reported on 08 Jun 2014 at 05:48AM.  Alkaline Phosphatase 38  46-116  NOTE: New Reference Range  12/20/13  SGPT (ALT) 8  14-63  NOTE: New Reference Range  12/20/13  SGOT (AST) 12  Total Protein, Serum 5.5  Albumin, Serum 2.3    12-Jan-16 04:24, Comprehensive Metabolic Panel  Bilirubin, Total 0.4  Alkaline Phosphatase 39  46-116  NOTE: New Reference  Range  12/20/13  SGPT (ALT) 10  14-63  NOTE: New Reference Range  12/20/13  SGOT (AST) 12  Total Protein, Serum 6.0  Albumin, Serum 2.5  Pathology:    11-Jan-16 00:00, Pathology Report  Pathology Report   CASE: [id number]  PATIENT: Ellen Blankenship  Surgical Pathology Report            SPECIMEN SUBMITTED:  A. Colon, transverse, cbx  B. Colon, splenic flexure, cbx      CLINICAL HISTORY:  None provided      PRE-OPERATIVE DIAGNOSIS:  Abd pain, abn CT scan      POST-OPERATIVE DIAGNOSIS:  Colitis            DIAGNOSIS:  A. COLON, TRANSVERSE; COLD BIOPSY:  - ISCHEMIC COLITIS.  - NEGATIVE FOR VIRAL CYTOPATHIC EFFECT, DYSPLASIA, AND MALIGNANCY.    B. COLON, SPLENIC FLEXURE; COLD BIOPSY:  - COLONIC MUCOSA WITH NO SIGNIFICANT ABNORMALITY.    Comment:  These findings were communicated to Dr. Vira Agar on 06/13/2014 at 3:50 PM.        GROSS DESCRIPTION:  A. Labeled: Cold biopsy transverse colon  Tissue Fragment(s): 3  Measurement: 0.1-0.4 cm  Comment: Pink redtissue fragments    Entirely submitted in cassette(s): 1    B. Labeled: Cold biopsy splenic flexure  Tissue Fragment(s): 1  Measurement: 0.4 cm  Comment: Tan tissue fragment    Entirely submitted in cassette(s): 1              Final Diagnosis performed by Quay Burow, MD.  Electronically signed  06/13/2014 3:51:27PM        The electronic signature indicates that the named Attending Pathologist  has evaluated the specimen    Technical component performed at [facility], [address] [place],  [place] [address]  Lab: [phone] Dir: Darrick Penna. Evette Doffing, MD    Professional component performed at [facility], [facility],  [place], [place], [place] [address]  Lab: [phone] Dir: Dellia Nims. Rubinas, MD  Routine BB:    05-Jan-16 18:39, Type and Antibody Screen  ABO Group + Rh Type   A Positive  Antibody Screen NEGATIVE  Result(s) reported on 06 Jun 2014 at 07:44PM.   Routine Micro:    05-Jan-16 18:52, Blood Culture  Micro Text Report   BLOOD CULTURE    COMMENT                   NO GROWTH AEROBICALLY/ANAEROBICALLY IN 5 DAYS     ANTIBIOTIC  Culture Comment   NO GROWTH AEROBICALLY/ANAEROBICALLY IN 5 DAYS   Result(s) reported on 11 Jun 2014 at 07:00PM.  Micro Text Report   BLOOD CULTURE    COMMENT                   NO GROWTH AEROBICALLY/ANAEROBICALLY IN 5 DAYS     ANTIBIOTIC  Culture Comment   NO GROWTH AEROBICALLY/ANAEROBICALLY IN 5 DAYS   Result(s) reported on 11 Jun 2014 at 07:00PM.    06-Jan-16 14:59, Urine Culture  Micro Text Report  URINE CULTURE    COMMENT                   NO GROWTH IN 36 HOURS     ANTIBIOTIC  Specimen Source   CLEAN CATCH  Culture Comment   NO GROWTH IN 36 HOURS   Result(s) reported on 09 Jun 2014 at 09:48AM.  General Ref:    08-Jan-16 04:56, C-Reactive Protein  C-Reactive Protein   ========== TEST NAME ==========  ========= RESULTS =========  = REFERENCE RANGE =    C-REACTIVE PROTEIN,QUANT    C-Reactive Protein, Quant  C-Reactive Protein, Quant       [H  46.3 mg/L            ]           0.0-4.9                  [facility]            No: [id number]            [address], [place], [place] [address]            Lindon Romp, MD         [phone]     Result(s) reported on 10 Jun 2014 at 05:49AM.    12-Jan-16 14:00, ANA w/Reflex to 9 Conf.Tests  ANA w/Reflex to 9 Conf.Tests   ========== TEST NAME ==========  ========= RESULTS =========  = REFERENCE RANGE =    ANA W/REFLEX-9 CONF.TEST    ANA w/Reflex if Positive  ANA Direct                      [   Negative             ]          Negative                  [facility]            No: [id number]            [address], [place], [place] [address]            Lindon Romp, MD         [phone]     Result(s) reported on 15 Jun 2014 at 09:58AM.    12-Jan-16 14:00, ANCA Panel  ANCA Panel   ========== TEST NAME  ==========  ========= RESULTS =========  = REFERENCE RANGE =    ANCA PANEL    ANCA Panel  Antimyeloperoxidase (MPO) Abs   [   <9.0 U/mL            ]           0.0-9.0  Antiproteinase 3 (PR-3) Abs     [   <3.5 U/mL            ]       0.0-3.5  Cytoplasmic (C-ANCA)            [   <1:20 titer          ]         Neg:<1:20  Perinuclear (P-ANCA)            [   <1:20 titer          ]         Neg:<1:20  The presence of positive fluorescence exhibiting P-ANCA or C-ANCA  patterns alone is not specific for the diagnosis  of Wegener's  Granulomatosis (WG) or microscopic polyangiitis. Decisions about  treatment should not be based solely on ANCA IFA results.  The  International ANCA Group Consensus recommends follow up testing of  positive sera with both PR-3 and MPO-ANCA enzyme immunoassays. As  many as 5% serum samples are positive only by EIA.  Ref. AM J Clin Pathol 1999;111:507-513.  Atypical pANCA                  [   <1:20 titer          ]         Neg:<1:20  The atypical pANCA pattern has been observed in a significant  percentage of patients with ulcerative colitis, primary sclerosing  cholangitis and autoimmune hepatitis.                  [facility]            No: [id number]            [address] [place], [place] [address]            Lindon Romp, MD         [phone]     Result(s) reported on 14 Jun 2014 at 05:21PM.    12-Jan-16 14:00, Anticardiolipin ABS, IgA, IgG, IgM  Anticardiolipin ABS, IgA, IgG, IgM   ========== TEST NAME ==========  ========= RESULTS =========  = REFERENCE RANGE =    ANTICARDIOLPN AB IGA,G,M    Anticardiolip Ab, IgA/G/M, Qn  Anticardiolipin Ab,IgG,Qn       [   <9 GPL U/mL          ]              0-14              Negative:              <15                                           Indeterminate:     15 - 20                                           Low-Med Positive: >20 - 80                                           High Positive:          >80  Anticardiolipin Ab,IgM,Qn       [   <9 MPL U/mL          ]              0-12                                           Negative:              <13  Indeterminate:     13 - 20    Low-Med Positive: >20 - 80                                           High Positive:         >80  Anticardiolipin Ab,IgA,Qn       [   <9 APL U/mL          ]              0-11                                           Negative:              <12                                      Indeterminate:     12 - 20                                           Low-Med Positive: >20 - 80                                           High Positive:         >80                  [facility]            No: [id number]            [address], [place], [place] [address]            Lindon Romp, MD         [phone]     Result(s) reported on 15 Jun 2014 at 09:48AM.    12-Jan-16 14:00, Antithrombin III  Antithrombin III   ========== TEST NAME ==========  ========= RESULTS =========  = REFERENCE RANGE =    ANTITHROMBIN III    Antithrombin III, Func/Immunol  Antithrombin Activity           [   114 %                ]            75-135  Antithrombin Antigen            [594 %                ]            [address]                  [facility]            No: [id number]            [address], [place], [place] [address]            Lindon Romp, MD         [phone]     Result(s) reported  on 15 Jun 2014 at 05:48AM.    12-Jan-16 14:00, Beta 2 Glycoprotein  Beta 2 Glycoprotein   ========== TEST NAME ==========  ========= RESULTS =========  = REFERENCE RANGE =    BETA2 GLYCOPROTEIN    Beta-2 Glycoprotein I Ab,G,A,M  Beta-2 Glycoprotein I Ab, IgG   [   <9 GPI IgG units     ]              0-20  The reference interval reflects a3SD or 99th percentile interval,  which is thought to represent a potentially clinically significant  result in accordance with  the International Consensus Statement on  the classification criteria for definitive antiphospholipid syndrome  (APS). J Thromb Haem 2006;4:295-306.  Beta-2 Glycoprotein I Ab, IgA   [   <9 GPI IgA units     ]              0-25  The reference interval reflects a 3SD or 99th percentile interval,  which is thought to represent a potentially clinically significant  result inaccordance with the International Consensus Statement on  the classification criteria for definitive antiphospholipid syndrome  (APS). J Thromb Haem 2006;4:295-306.  Beta-2 Glycoprotein I Ab, IgM   [   <9 GPI IgM units     ]              0-32  The reference interval reflects a 3SD or 99th percentile interval,  which is thought to represent a potentially clinically significant  result in accordance with the International Consensus Statement on  the classification criteria for definitive antiphospholipid syndrome  (APS). J Thromb Haem 2006;4:295-306.                  [facility]            No: [id number]            [address] [place], [place] [address]            Lindon Romp, MD         [phone]     Result(s) reported on 15 Jun 2014 at 09:48AM.    12-Jan-16 14:00, Cryoglobulins  Cryoglobulins   ========== TEST NAME ==========  ========= RESULTS =========  = REFERENCE RANGE =    CRYOGLOBULINS    Cryoglobulin, Ql, Serum, Rflx  Cryoglobulin, Ql, Serum, Rflx   [   Result Pending       ]                                    [facility]      No: [id number]            [address], [place], [place] [address]            Lindon Romp, MD         [phone]     Result(s) reported on 15 Jun 2014 at 09:58AM.    12-Jan-16 14:00, Homocysteine, Serum / Plasma  Homocysteine, Serum / Plasma   ========== TEST NAME ==========  ========= RESULTS =========  = REFERENCE RANGE =    HOMOCYSTEINE, SERUM    Homocyst(e)ine, Plasma  Homocyst(e)ine, Plasma          [   9.5 umol/L           ]           0.0-15.0  [facility]     No: [id number]            [address] [place], [place] [address]            Lindon Romp, MD         [phone]     Result(s) reported on 15 Jun 2014 at 09:48AM.    12-Jan-16 14:00, Lupus Anticoagulant Comprehensive  Lupus Anticoagulant Comprehensive   ========== TEST NAME ==========  ========= RESULTS =========  = REFERENCE RANGE =    LUPUS COMPREHNSV PROFILE    Lupus Anticoagulant Comp  Dilute Prothrombin Time(dPT)    [   34.5 sec             ]          0.0-55.0  dPT Confirm Ratio               [  0.97 Ratio           ]         0.00-1.20  Thrombin Time                   [   18.2 sec             ]          0.0-20.0  PTT-LA                          [   39.7 sec             ]          0.0-50.0  dRVVT                           [   25.9 sec      ]          0.0-55.1  Lupus Reflex Interpretation     [   Result Pending       ]                                    [facility]            No: [id number]            [address] [place], [place] [address]            Enriqueta Shutter         [phone]     Result(s) reported on 15 Jun 2014 at 10:12AM.    12-Jan-16 14:00, Protein C Functional  Protein C Functional   ========== TEST NAME ==========  ========= RESULTS =========  = REFERENCE RANGE =    PROTEIN C FUNCTIONAL    Protein C-Functional  Protein C-Functional            [   89 %                 ]            [address]    No: [id number]            [address] [place], [place] [address]            Lindon Romp, MD  [phone]     Result(s) reported on 15 Jun 2014 at 05:48AM.    12-Jan-16 14:00, Protein S, Functional  Protein S, Functional   ========== TEST NAME ==========  ========= RESULTS =========  = REFERENCE RANGE =    PROTEIN S, FUNCTIONAL    Protein S-Functional  Protein S-Functional            [   90 %                 ]             [address]                  [facility]     No: [id number]            [address], [place], [place] [address]            Lindon Romp, MD         [phone]     Result(s) reported on 15 Jun 2014 at 05:48AM.  Cardiology:    05-Jan-16 18:36, ED ECG  Ventricular Rate 105  Atrial Rate 105  P-R Interval 144  QRS Duration 72  QT 326  QTc 430  P Axis 56  R Axis 101  T Axis 25  ECG interpretation   *** Poor data quality, interpretation may be adversely affected  Sinus tachycardia  Possible Left atrial enlargement  Rightward axis  Borderline ECG  No previous ECGs available  ----------unconfirmed----------  Confirmed by OVERREAD, NOT ([id number]), editor PEARSON, BARBARA ([id number]) on 06/07/2014 9:55:25 AM  ED ECG   Routine Chem:    05-Jan-16 18:27, Comprehensive Metabolic Panel  Glucose, Serum 114  BUN 25  Creatinine (comp) 0.96  Sodium, Serum 133  Potassium, Serum 4.2  Chloride, Serum 101  CO2, Serum 24  Calcium (Total), Serum 8.7  Osmolality (calc) 272  eGFR (African American) >60  eGFR (Non-African American) >60  eGFR values <87mL/min/1.73 m2 may be an indication of chronic  kidney disease (CKD).  Calculated eGFR, using the MRDR Study equation, is useful in   patients with stable renal function.  The eGFR calculation will not be reliable in acutely ill patients  when serum creatinine is changing rapidly. It is not useful in  patients on dialysis. The eGFR calculation may not be applicable  to patients at the low and high extremes of body sizes, pregnant  women, and vegetarians.  Anion Gap 8    05-Jan-16 18:27, HCG Betasubunit Quant. Serum  HCG Betasubunit Quant. Serum < 1  1-3   (International Unit)   -----------------  Non-pregnant <5  Weeks Post LMP mIU/mL   3- 4 wk 9 - 130   4- 5 wk 75 - 2,600   5- 6 wk 850 - 20,800   6- 7 wk 4,000 - 100,000   7-12 wk 11,500 - 289,000  12-16 wk 18,000 - 137,000  16-29 wk 1,400 - 53,000  29-41 wk 940 - 60,000     05-Jan-16 18:27, Lipase  Lipase 119  Result(s) reported on 06 Jun 2014 at 06:52PM.    05-Jan-16 18:27, Prothrombin Time  Result Comment   PT/PTT - ORDERS CANCELLED, NO SPECIMEN RECEIVED.   - CALLED MIKE CHILDRESS ON 06/06/14 AT   - 1840. [place] REORDERED   Result(s) reported on 06 Jun 2014 at 06:48PM.    06-Jan-16 05:00, Comprehensive Metabolic Panel  Glucose, Serum 78  BUN 19  Creatinine (comp) 0.94  Sodium, Serum 139  Potassium, Serum 3.6  Chloride, Serum 108  CO2, Serum 25  Calcium (Total), Serum 7.2  Osmolality (calc) 279  eGFR (African American) >60  eGFR (Non-African American) >60  eGFR values <67mL/min/1.73 m2 may be an indication of chronic  kidney disease (CKD).  Calculated eGFR, using the MRDR Study equation, is useful in   patients with stable renal function.  The eGFR calculation will not be reliable in acutely ill patients  when serum creatinine is changing rapidly. It is not useful in  patients on dialysis. The eGFR calculation may not be applicable  to patients at the low and high extremes of body sizes, pregnant  women, and vegetarians.  Anion Gap 6    07-Jan-16 90:24, Basic Metabolic Panel (w/Total Calcium)  Glucose, Serum 85  BUN 7  Creatinine (comp) 0.86  Sodium, Serum 138  Potassium, Serum 3.4  Chloride, Serum 105  CO2, Serum 26  Calcium (Total), Serum 7.4  Anion Gap 7  Osmolality (calc) 273  eGFR (African American) >60  eGFR (Non-African American) >60  eGFR values <69mL/min/1.73 m2 may be an indication of chronic  kidney disease (CKD).  Calculated eGFR, using the MRDR Study equation, is useful in   patients with stable renal function.  The eGFR calculation will not be reliable in acutely ill patients  when serum creatinine is changing rapidly. It is not useful in  patients on dialysis. The eGFR calculation may not be applicable  to patients at the low and high extremes of body sizes, pregnant  women, and vegetarians.    08-Jan-16 09:73, Basic  Metabolic Panel (w/Total Calcium)  Glucose, Serum 84  BUN 8  Creatinine (comp) 0.85  Sodium, Serum 142  Potassium, Serum 3.4  Chloride, Serum 108  CO2, Serum 28  Calcium (Total), Serum 7.8  Anion Gap 6  Osmolality (calc) 281  eGFR (African American) >60  eGFR (Non-African American) >60  eGFR values <21mL/min/1.73 m2 may be an indication of chronic  kidney disease (CKD).  Calculated eGFR, using the MRDR Study equation, is useful in   patients with stable renal function.  The eGFR calculation will not be reliable in acutely ill patients  when serum creatinine is changing rapidly. It is not useful in  patients on dialysis. The eGFR calculation may not be applicable  to patients at the low and high extremes of body sizes, pregnant  women, and vegetarians.    09-Jan-16 53:29, Basic Metabolic Panel (w/Total Calcium)  Glucose, Serum 79  BUN 8  Creatinine (comp) 0.94  Sodium, Serum 142  Potassium, Serum 3.4  Chloride, Serum 106  CO2, Serum 28  Calcium (Total), Serum 8.1  Anion Gap 8  Osmolality (calc) 280  eGFR (African American) >60  eGFR (Non-African American) >60  eGFR values <14mL/min/1.73 m2 may be an indication of chronic  kidney disease (CKD).  Calculated eGFR, using the MRDR Study equation, is useful in   patients with stable renal function.  The eGFR calculation will not be reliable in acutely ill patients  when serum creatinine is changing rapidly. It is not useful in  patients on dialysis. The eGFR calculation may not be applicable  to patients at the low and high extremes of body sizes, pregnant  women, and vegetarians.    12-Jan-16 04:24, Comprehensive Metabolic Panel  Glucose, Serum 86  BUN 10  Creatinine (comp) 1.03  Sodium, Serum 140  Potassium, Serum 3.7  Chloride, Serum 106  CO2, Serum 29  Calcium (Total), Serum 8.1  Osmolality (calc) 278  eGFR (African American) >60  eGFR (Non-African American) >60  eGFR values <9mL/min/1.73 m2 may be an indication  of chronic  kidney disease (CKD).  Calculated eGFR, using the MRDR Study equation, is useful in   patients with stable renal function.  The eGFR calculation will not be reliable in acutely ill patients  when serum creatinine is changing rapidly. It is not useful in  patients on dialysis. The eGFR calculation may not be applicable  to patients at the low and high extremes of body sizes, pregnant  women, and vegetarians.  Anion Gap 5    12-Jan-16 04:24, Magnesium, Serum  Magnesium, Serum 2.1  1.8-2.4  THERAPEUTIC RANGE: 4-7 mg/dL  TOXIC: > 10 mg/dL   -----------------------    12-Jan-16 04:24, Phosphorus, Serum  Phosphorus, Serum 3.9  Result(s) reported on 13 Jun 2014 at 05:11AM.  Routine UA:    05-Jan-16 23:55, Urinalysis  Color (UA) Yellow  Clarity (UA) Hazy  Glucose (UA) Negative  Bilirubin (UA) Negative  Ketones (UA) 1+  Specific Gravity (UA) >1.060  Blood (UA) Negative  pH (UA) 5.0  Protein (UA) Negative  Nitrite (UA) Negative  Leukocyte Esterase (UA) 1+  Result(s) reported on 07 Jun 2014 at 01:39AM.  RBC (UA) 4 /HPF  WBC (UA) 45 /HPF  Bacteria (UA) TRACE  Epithelial Cells (UA) 10 /HPF  Mucous (UA) PRESENT  Result(s) reported on 07 Jun 2014 at 01:39AM.  Routine Coag:    05-Jan-16 18:27, Activated PTT  Activated PTT (APTT) -  A HCT value >55% may artifactually increase the APTT. In one study,  the increase was an average of 19%.  Reference: "Effect on Routine and Special Coagulation Testing Values  of Citrate Anticoagulant Adjustment in Patients with High HCT Values."  American Journal of Clinical Pathology 2006;126:400-405.    05-Jan-16 18:27, Prothrombin Time  Prothrombin -  INR -  INR reference interval applies to patients on anticoagulant therapy.  A single INR therapeutic range for coumarins is not optimal for all  indications; however, the suggested range for most indications is  2.0 - 3.0.  Exceptions to the INR Reference Range may include: Prosthetic  heart  valves, acute myocardial infarction, prevention of myocardial  infarction, and combinations of aspirin and anticoagulant. The need  for a higher or lower target INR must be assessed individually.  Reference: The Pharmacology and Management of the Vitamin K   antagonists: the seventh ACCP Conference on Antithrombotic and  Thrombolytic Therapy. HYWVP.7106 Sept:126 (3suppl): [id number].  A HCT value >55% may artifactually increase the PT.  In one study,   the increase was an average of 25%.  Reference:  "Effect on Routine and Special Coagulation Testing Values  of Citrate Anticoagulant Adjustment in Patients with High HCT Values."  American Journal of Clinical Pathology 2006;126:400-405.    05-Jan-16 18:39, Activated PTT  Activated PTT (APTT) 31.7  A HCT value >55% may artifactually increase the APTT. In one study,  the increase was an average of 19%.  Reference: "Effect on Routine and Special Coagulation Testing Values  of Citrate Anticoagulant Adjustment in Patients with High HCT Values."  American Journal of Clinical Pathology 2006;126:400-405.    05-Jan-16 18:39, Prothrombin Time  Prothrombin 15.1  INR 1.2  INR reference interval applies to patients on anticoagulant therapy.  A single INR therapeutic range for coumarins is not optimal for all  indications; however, the suggested range for most indications is  2.0 - 3.0.  Exceptions to the INR Reference Range may include: Prosthetic heart  valves, acute myocardial infarction, prevention  of myocardial  infarction, and combinations of aspirin and anticoagulant. The need  for a higher or lower target INR must be assessed individually.  Reference: The Pharmacology and Management of the Vitamin K   antagonists: the seventh ACCP Conference on Antithrombotic and  Thrombolytic Therapy. PVVZS.8270 Sept:126 (3suppl): [id number].  A HCT value >55% may artifactually increase the PT.  In one study,   the increase was an average of  25%.  Reference:  "Effect on Routine and Special Coagulation Testing Values  of Citrate Anticoagulant Adjustment in Patients with High HCT Values."  American Journal of Clinical Pathology 2006;126:400-405.  Routine Hem:    05-Jan-16 18:27, Hemogram, Platelet Count  WBC (CBC) 20.5  RBC (CBC) 5.62  Hemoglobin (CBC) 18.0  Hematocrit (CBC) 53.7  Platelet Count (CBC) 226  Result(s) reported on 06 Jun 2014 at 06:41PM.  MCV 95  MCH 31.9  MCHC 33.5  RDW 13.4    06-Jan-16 05:00, CBC Profile  WBC (CBC) 12.3  RBC (CBC) 4.02  Hemoglobin (CBC) 12.8  Hematocrit (CBC) 38.8  Platelet Count (CBC) 161  MCV 97  MCH 31.7  MCHC 32.8  RDW 13.6  Neutrophil % 74.6  Lymphocyte % 14.8  Monocyte % 9.5  Eosinophil % 0.8  Basophil % 0.3  Neutrophil # 9.2  Lymphocyte # 1.8  Monocyte # 1.2  Eosinophil # 0.1  Basophil # 0.0  Result(s) reported on 07 Jun 2014 at 06:47AM.    06-Jan-16 09:44, CBC Profile  WBC (CBC) 11.3  RBC (CBC) 3.97  Hemoglobin (CBC) 12.7  Hematocrit (CBC) 38.1  Platelet Count (CBC) 162  MCV 96  MCH 32.0  MCHC 33.3  RDW 13.5  Neutrophil % 75.8  Lymphocyte % 14.1  Monocyte % 8.7  Eosinophil % 1.0  Basophil % 0.4  Neutrophil # 8.6  Lymphocyte # 1.6  Monocyte # 1.0  Eosinophil # 0.1  Basophil # 0.0  Result(s) reported on 07 Jun 2014 at 10:00AM.    06-Jan-16 15:50, CBC Profile  WBC (CBC) 9.9  RBC (CBC) 4.10  Hemoglobin (CBC) 13.3  Hematocrit (CBC) 39.3  Platelet Count (CBC) 166  MCV 96  MCH 32.5  MCHC 33.9  RDW 13.4  Neutrophil % 73.3  Lymphocyte % 16.6  Monocyte % 8.6  Eosinophil % 1.2  Basophil % 0.3  Neutrophil # 7.2  Lymphocyte # 1.6  Monocyte # 0.8  Eosinophil # 0.1  Basophil # 0.0  Result(s) reported on 07 Jun 2014 at 04:09PM.    06-Jan-16 23:42, Hemoglobin  Hemoglobin (CBC) 13.8  Result(s) reported on 07 Jun 2014 at 12:40AM.    07-Jan-16 04:41, CBC Profile  WBC (CBC) 7.5  RBC (CBC) 3.80  Hemoglobin (CBC) 12.4  Hematocrit (CBC) 36.8  Platelet  Count (CBC) 148  MCV 97  MCH 32.5  MCHC 33.6  RDW 13.3  Neutrophil % 73.0  Lymphocyte % 16.6  Monocyte % 9.1  Eosinophil % 1.1  Basophil % 0.2  Neutrophil # 5.4  Lymphocyte # 1.2  Monocyte # 0.7  Eosinophil # 0.1  Basophil # 0.0  Result(s) reported on 08 Jun 2014 at 05:31AM.    08-Jan-16 04:56, Sedimentation Rate  Erythrocyte Sed Rate 46  Result(s) reported on 09 Jun 2014 at 05:48AM.    11-Jan-16 05:10, CBC Profile  WBC (CBC) 4.7  RBC (CBC) 3.97  Hemoglobin (CBC) 12.8  Hematocrit (CBC) 37.1  Platelet Count (CBC) 283  MCV 93  MCH 32.2  MCHC 34.5  RDW 12.8  Neutrophil % 50.5  Lymphocyte %  26.6  Monocyte % 16.3  Eosinophil % 5.6  Basophil % 1.0  Neutrophil # 2.4  Lymphocyte # 1.3  Monocyte # 0.8  Eosinophil # 0.3  Basophil # 0.0  Result(s) reported on 12 Jun 2014 at 05:44AM.   RADIOLOGY:  Radiology Results: XRay:    05-Jan-16 18:45, Chest 1 View AP or PA  Chest 1 View AP or PA  REASON FOR EXAM:    severe abd pain, upright portable please, eval for   FREE AIR  COMMENTS:   May transport without cardiac monitor    PROCEDURE: DXR - DXR CHEST 1 VIEWAP OR PA  - Jun 06 2014  6:45PM     CLINICAL DATA:  severe abdominal pain since this morning was told  she has large amount of fluid in her abdomen. shielded. no chest  complaints    EXAM:  CHEST - 1 VIEW    COMPARISON:  None available  FINDINGS:  Lungs are clear. Heart size and mediastinal contours are within  normal limits.  No effusion.  Visualized skeletal structures are unremarkable.     IMPRESSION:  No acute cardiopulmonary disease.      Electronically Signed    By: Arne Cleveland M.D.    On: 06/06/2014 19:08       Verified By: Kandis Cocking, M.D.,    07-Jan-16 11:12, Abdomen 3 Way Includes PA Chest  Abdomen 3 Way Includes PA Chest  REASON FOR EXAM:    abdomen distention, sob  COMMENTS:       PROCEDURE: DXR - DXR ABDOMEN 3-WAY (INCL PA CXR)  - Jun 08 2014 11:12AM     CLINICAL DATA:   Initial evaluation for left-sided abdominal pain  beginning 3 days ago, shortness of breath, current history colitis    EXAM:  ABDOMEN SERIES    COMPARISON:  06/06/2014    FINDINGS:  Heart size is normal. Vascular pattern is normal. There is  interstitial prominence at both lung bases with a tiny right  effusion. There is no free air.    There are no abnormally dilated loops of bowel. However, there is  air-fluid level in the proximal descending colon, with little gas  distal to this.     IMPRESSION:  1. Bowel gas pattern is nonspecific but consistent with known  left-sided colitis  2. Development of interstitial prominence both lung bases with tiny  right pleural effusion. Developing pneumonia not excluded.      Electronically Signed    By: Skipper Cliche M.D.    On: 06/08/2014 11:38         Verified By: Rachael Fee, M.D.,  [place]:    08-Jan-16 11:53, US Abdomen Limited Survey  US Abdomen Limited Survey  REASON FOR EXAM:    abdominal distention eval for Ascites  COMMENTS:       PROCEDURE: [place]  - US ABDOMEN LIMITED SURVEY  - Jun 09 2014 11:53AM     CLINICAL DATA:  Abdominal distension, possible ascites    EXAM:  US ABDOMEN LIMITED - RIGHT UPPER QUADRANT    COMPARISON:  None.    FINDINGS:  Limited abdominal ultrasound demonstrates no evidence of significant  ascites. Only trace ascites noted in right lower quadrant.   IMPRESSION:  Only trace ascites is noted in right lower quadrant of the abdomen.      Electronically Signed    By: Lahoma Crocker M.D.    On: 06/09/2014 11:57  Verified By: Ephraim Hamburger, M.D.,  LabUnknown:    07-Dec-15 11:03, [id number]  [id number]  REASON FOR EXAM:    back pain, Fax results [id number]  COMMENTS:       PROCEDURE: KDR - KDXR LUMBAR SPINE AP AND LATERAL  - May 08 2014 11:03AM     CLINICAL DATA:  Back pain for multiple years, worsening back pain  last 2 weeks, history of pars defect    EXAM:  LUMBAR SPINE - 2-3  VIEW    COMPARISON:  None.    FINDINGS:  Three views of lumbar spine submitted. No acute fracture. There is  bilateral pars defect at L5 level. Moderate disc space flattening at  L5-S1 level. There is about 1 cm anterolisthesis L5 on S1 vertebral  body.     IMPRESSION:  Moderate disc space flattening at L5-S1 level. About 1 cm  anterolisthesis L5 on S1 vertebral body. Bilateral pars defect at L5  level. Alignment and vertebral body heights are preserved. No acute  fracture.      Electronically Signed    By: Lahoma Crocker M.D.    On: 05/08/2014 12:17     Verified By: Ephraim Hamburger, M.D.,    05-Jan-16 18:45, Chest 1 View AP or PA  PACS Image    05-Jan-16 19:18, CT Abdomen and Pelvis With Contrast  PACS Image    07-Jan-16 11:12, Abdomen 3 Way Includes PA Chest  PACS Image    10-Jan-16 11:33, CT Abdomen and Pelvis With Contrast  PACS Image  CT:    05-Jan-16 19:18, CT Abdomen and Pelvis With Contrast  CT Abdomen and Pelvis With Contrast  REASON FOR EXAM:    (1) NO PO. SEVERE LLQ pain with porbable   hemoperitoneum; (2) NO PO. NO PO. SEVER  COMMENTS:   May transport without cardiac monitor    PROCEDURE: CT  - CT ABDOMEN / PELVIS  W  - Jun 06 2014  7:18PM     CLINICAL DATA:  Fever and pain. Fluid in abdomen on pelvic  ultrasound. Lower abdominal pain. Severe left lower quadrant pain  with probable hemoperitoneum.    EXAM:  CT ABDOMEN AND PELVIS WITH CONTRAST    TECHNIQUE:  Multidetector CT imaging of the abdomen and pelvis was performed  using the standard protocol following bolus administration of  intravenous contrast.    CONTRAST:  100 cc Omnipaque 300    COMPARISON:  None.    FINDINGS:  Lower chest: Mild interstitial thickening at the lung bases,  possibly related to fluid overload. Normal heart size. Trace  pericardial fluid. No pleural fluid.    Hepatobiliary: Mild hepatic steatosis. Focal steatosis adjacent the  falciform ligament. Normal gallbladder, without  biliary ductal  dilatation.  Pancreas: Normal, without mass or pancreatic ductal dilatation.    Spleen: Normal    Adrenals/Urinary Tract: Normal adrenal glands. Normal kidneys,  without hydronephrosis. Normal urinary bladder.    Stomach/Bowel: Normal stomach, without wall thickening. Rectum and  sigmoid are within normal limits. Moderate to marked wall thickening  involves the transverse colon and splenic flexure. Example images 41  and 42. Normal terminal ileum. Appendix not well visualized.    Small bowel loops are positioned within ventral abdominal wall  laxity, including on image 39. No obstruction or other acute  complication.  Vascular/Lymphatic: Normal appearance of the abdominal aorta and  branch vessels. Mildly prominent gastric hepatic ligament nodes are  likely related to steatosis. Example at 11 mm on image 17.  No pelvic  adenopathy.    Reproductive: Normal uterus. Suspect a right ovarian corpus luteal  cyst at 9 mm on image 60 of series 2.    Other: Small volume perihepatic fluid appears simple. There is also  small to moderate volume pelvic fluid. No evidence of drainable  abscess. There is thickening of the peritoneal surface within the  cul-de-sac, including on image 67. This is mild. No evidence of  hematoma or intraperitoneal hemorrhage.    Musculoskeletal: L5 bilateral pars defects. Grade 1 L5-S1  anterolisthesis.     IMPRESSION:  1. Moderate wall thickening involving the transverse and splenic  flexure colon. Most consistent with infectious colitis.  2. Right ovarian corpus luteal cyst.  3. Trace abdominal and small volume pelvic fluid, greater than  physiologic. Concurrent mild peritoneal thickening. Suspect fluid  secondary to colitis. Cannot exclude infected ascites. Less likely,  this fluid with peritoneal thickening could be secondary to recent  cyst rupture.No evidence of abscess.  4. Small bowel within nonobstructive ventral abdominal wall  laxity.  5. Trace pericardial fluid with lung base septal thickening.  Question fluid overload.  Electronically Signed    By: Abigail Miyamoto M.D.    On: 06/06/2014 19:34         Verified By: Areta Haber, M.D.,    10-Jan-16 11:33, CT Abdomen and Pelvis With Contrast  CT Abdomen and Pelvis With Contrast  REASON FOR EXAM:    (1) abd pain; (2) abd pain  COMMENTS:       PROCEDURE: CT  - CT ABDOMEN / PELVIS  W  - Jun 11 2014 11:33AM     CLINICAL DATA:  Abdominal pain, diarrhea, bloating, fever    EXAM:  CT ABDOMEN AND PELVIS WITH CONTRAST    TECHNIQUE:  Multidetector CT imaging of the abdomen and pelvis was performed  using the standard protocol following bolus administration of  intravenous contrast.  CONTRAST:  100 mL Omnipaque 300 IV    COMPARISON:  06/06/2014    FINDINGS:  Lower chest: Again noted is interlobular septal thickening at the  lung bases. Minimal/trace left pleural fluid.    Hepatobiliary: Liver is notable for focal fat/ altered perfusion  along the falciform ligament.    Gallbladder is unremarkable. No intrahepatic or extrahepatic ductal  dilatation.    Pancreas: Within normal limits.  Spleen: Within normal limits.    Adrenals/Urinary Tract: Adrenal glands are unremarkable.    Kidneys are within normal limits.  No hydronephrosis.    Bladder is within normal limits.    Stomach/Bowel: Stomach is unremarkable.    No evidence of bowel obstruction.    Normal appendix.    Again noted is wall thickening involving the distal transverse colon  and splenic flexure, suggesting infectious/ inflammatory colitis.    No drainable fluid collection/abscess.  No free air.    Vascular/Lymphatic: No evidence of abdominal aortic aneurysm.    No suspicious abdominopelvic lymphadenopathy.    Reproductive: Uterus is unremarkable.    Bilateral ovaries are within normal limits.    Other: Smallvolume pelvic ascites.    Musculoskeletal: Grade 1 spondylolisthesis  at L5-S1.   IMPRESSION:  Wall thickening involving the distal transverse colon and splenic  flexure, suggesting infectious/inflammatory colitis.    No drainable fluid collection/abscess.  No free air.    Small volume pelvic ascites, likely reactive.      Electronically Signed    By: Julian Hy M.D.    On: 06/11/2014 12:57  Verified By: Julian Hy, M.D.,   ASSESSMENT AND PLAN:  Assessment/Admission Diagnosis ischemic colitis of unclear etiology, biopsy proven no other major medical issues known.   Plan Her CTs were not CTA, but her aorta and visceral vessels are all well seen and no obvious abnormalities are present.  No signs of dissection or aneurysms.  Flow is present and appears pretty good.  No obvious signs of mesenteric venous thrombosis either. She has no atherosclerosis anywhere on her scan.  This is a very odd and interesting case.  She may have some odd congenital abnormality with poor flow to her splenic flexure area (lack or middle colic) and her pain occurred with exercise which may have precipitated a lower flow state.  Other abnormalities like FMD could be possible but these are very uncommon.  Her main vessels appear OK on CT, but as this is a limited study without full evaluation of the arteries, would recommend ourpatient follow up with mesenteric duplex.  Discussed catheter based angiogram, but yield is probably pretty low at this point.  If she has recurrent symptoms, would think angiogram may be helpful.  If this is a recurring issue, at her age she should also probably consider bowel resection.  I will plan to see in office in several weeks.  She voices her understanding and agrees with our plan of care.     level 4   Electronic Signatures: Algernon Huxley (MD)  (Signed 14-Jan-16 15:33)  Authored: Chief Complaint and History, PAST MEDICAL/SURGICAL HISTORY, ALLERGIES, HOME MEDICATIONS, Family and Social History, Review of Systems, Physical Exam,  LABS, RADIOLOGY, Assessment and Plan   Last Updated: 14-Jan-16 15:33 by Algernon Huxley (MD)

## 2014-10-01 NOTE — Consult Note (Signed)
Note Type Consult   Subjective: Chief Complaint/Diagnosis:   ischemic colitis, assess for possible hypercoagulable state. HPI:   Patient is a 34 year old female with no significant past medical history who presented to the ER with significant abdominal pain after going for [place] in cold weather. Patient states she felt fine at the beginning of her [place] that approximately 1-1/2 miles into it developed severe cramping abdominal pain. Patient states that she has had this symptom before, but it was not severe and typically resolved without intervention. She otherwise feels well. She has had no recent travel or changes in her diet. She has no neurologic complaints. She denies any fevers. She has a good appetite and denies weight loss. She denies any chest pain or shortness of breath. She denies any nausea, vomiting, cuts patient, or diarrhea. She has no urinary complaints. Patient otherwise feels well and offers no further specific complaints.   Review of Systems:  Performance Status (ECOG): 0  Review of Systems:   As per HPI. Otherwise, 10 point system review was negative.   Allergies:  Sulfa drugs: Swelling  PFSH: Additional Past Medical and Surgical History: pancreatitis, unknown etiology. Anterior cruciate ligament repair.    Family history: Maternal grandmother with Wegner's disease.    Social history: Married with one child. Normal pregnancy. Denies tobacco or alcohol.   Home Medications: Medication Instructions Last Modified Date/Time  Vitamin D3 1 tab(s) orally once a day 05-Jan-16 19:44  biotin 1 tab(s) orally once a day 05-Jan-16 19:44  multivitamin 1 tab(s) orally once a day 05-Jan-16 19:44   Vital Signs:  :: vital signs stable, patient afebrile.   Physical Exam:  General: well developed, well nourished, and in no acute distress  Mental Status: normal affect  Eyes: anicteric sclera  Head, Ears, Nose,Throat: Normocephalic, moist mucous membranes, clear oropharynx without  erythema or thrush.  Neck, Thyroid: No palpable lymphadenopathy, thyroid midline without nodules.  Respiratory: clear to auscultation bilaterally  Cardiovascular: regular rate and rhythm, no murmur, rub, or gallop  Gastrointestinal: soft, nondistended, nontender, no organomegaly.  normal active bowel sounds  Musculoskeletal: No edema  Skin: No rash or petechiae noted  Neurological: alert, answering all questions appropriately.  Cranial nerves grossly intact   Laboratory Results: Hepatic:  12-Jan-16 04:24   Bilirubin, Total 0.4  Alkaline Phosphatase  39 (46-116 NOTE: New Reference Range 12/20/13)  SGPT (ALT)  10 (14-63 NOTE: New Reference Range 12/20/13)  SGOT (AST)  12  Total Protein, Serum  6.0  Albumin, Serum  2.5  Routine Chem:  12-Jan-16 04:24   Glucose, Serum 86  BUN 10  Creatinine (comp) 1.03  Sodium, Serum 140  Potassium, Serum 3.7  Chloride, Serum 106  CO2, Serum 29  Calcium (Total), Serum  8.1  Osmolality (calc) 278  eGFR (African American) >60  eGFR (Non-African American) >60 (eGFR values <48m/min/1.73 m2 may be an indication of chronic kidney disease (CKD). Calculated eGFR, using the MRDR Study equation, is useful in  patients with stable renal function. The eGFR calculation will not be reliable in acutely ill patients when serum creatinine is changing rapidly. It is not useful in patients on dialysis. The eGFR calculation may not be applicable to patients at the low and high extremes of body sizes, pregnant women, and vegetarians.)  Anion Gap  5  Magnesium, Serum 2.1 (1.8-2.4 THERAPEUTIC RANGE: 4-7 mg/dL TOXIC: > 10 mg/dL  -----------------------)  Phosphorus, Serum 3.9 (Result(s) reported on 13 Jun 2014 at 05:11AM.)  Routine Hem:  11-Jan-16 05:10  WBC (CBC) 4.7  RBC (CBC) 3.97  Hemoglobin (CBC) 12.8  Hematocrit (CBC) 37.1  Platelet Count (CBC) 283  MCV 93  MCH 32.2  MCHC 34.5  RDW 12.8  Neutrophil % 50.5  Lymphocyte % 26.6  Monocyte % 16.3   Eosinophil % 5.6  Basophil % 1.0  Neutrophil # 2.4  Lymphocyte # 1.3  Monocyte # 0.8  Eosinophil # 0.3  Basophil # 0.0 (Result(s) reported on 12 Jun 2014 at 05:44AM.)   Radiology Results: CT:    10-Jan-16 11:33, CT Abdomen and Pelvis With Contrast  CT Abdomen and Pelvis With Contrast   REASON FOR EXAM:    (1) abd pain; (2) abd pain  COMMENTS:       PROCEDURE: CT  - CT ABDOMEN / PELVIS  W  - Jun 11 2014 11:33AM     CLINICAL DATA:  Abdominal pain, diarrhea, bloating, fever    EXAM:  CT ABDOMEN AND PELVIS WITH CONTRAST    TECHNIQUE:  Multidetector CT imaging of the abdomen and pelvis was performed  using the standard protocol following bolus administration of  intravenous contrast.  CONTRAST:  100 mL Omnipaque 300 IV    COMPARISON:  06/06/2014    FINDINGS:  Lower chest: Again noted is interlobular septal thickening at the  lung bases. Minimal/trace left pleural fluid.    Hepatobiliary: Liver is notable for focal fat/ altered perfusion  along the falciform ligament.    Gallbladder is unremarkable. No intrahepatic or extrahepatic ductal  dilatation.    Pancreas: Within normal limits.  Spleen: Within normal limits.    Adrenals/Urinary Tract: Adrenal glands are unremarkable.    Kidneys are within normal limits.  No hydronephrosis.    Bladder is within normal limits.    Stomach/Bowel: Stomach is unremarkable.    No evidence of bowel obstruction.    Normal appendix.    Again noted is wall thickening involving the distal transverse colon  and splenic flexure, suggesting infectious/ inflammatory colitis.    No drainable fluid collection/abscess.  No free air.    Vascular/Lymphatic: No evidence of abdominal aortic aneurysm.    No suspicious abdominopelvic lymphadenopathy.    Reproductive: Uterus is unremarkable.    Bilateral ovaries are within normal limits.    Other: Smallvolume pelvic ascites.    Musculoskeletal: Grade 1 spondylolisthesis at L5-S1.    IMPRESSION:  Wall thickening involving the distal transverse colon and splenic  flexure, suggesting infectious/inflammatory colitis.    No drainable fluid collection/abscess.  No free air.    Small volume pelvic ascites, likely reactive.      Electronically Signed    By: Julian Hy M.D.    On: 06/11/2014 12:57         Verified By: Julian Hy, M.D.,   Assessment and Plan: Impression:   Ischemic colitis, assess for possible hypercoagulable state. Plan:   1. Ischemic colitis: Unclear etiology. Given patient's personal and family history, an underlying hypercoagulable disorder is unlikely, but possible. Therefore, a full hypercoagulable workup has been ordered for completeness. Have also ordered ANA, ANCA panel, and cryoglobulins.  Although the clinical presentation is not consistent with porphyria, workup for this can be considered if no other etiology can be determined.  No intervention is needed at this time. Patient has been instructed to follow-up in the [place] in approximately 2 weeks for further evaluation and discussion of her results. Both her and her husband expressed understanding and were in agreement with this plan. consult, call with questions.  Electronic Signatures: Delight Hoh (MD)  (Signed 12-Jan-16 13:38)  Authored: Note Type, CC/HPI, Review of Systems, ALLERGIES, Patient Family Social History, HOME MEDICATIONS, Vital Signs, Physical Exam, Lab Results Review, Rad Results Review, Assessment and Plan   Last Updated: 12-Jan-16 13:38 by Delight Hoh (MD)

## 2014-10-01 NOTE — Consult Note (Signed)
MRI is not very good for bowel visualization, her fluid in the abd on the MRI is evidence of the severity of her initial illness and it has not healed substantially yet.  The lipase is non specific and may not be pancreas in origin.  I will start her on a little yogurt with her clear liquids.  Would not move to fast with reduction in pain medicine given the signifcant amt of fluid from inflammation.  The possibility of surgery is still a factor if she gets worse or does not improve. Strictures of the colon also can occur after severe local inflammation.  Will repeat CBC, sed rate and CRP.  Dr. Gustavo Lah to cover and will see her Friday, Saturday and Sunday.  Electronic Signatures: Manya Silvas (MD)  (Signed on 21-Jan-16 17:22)  Authored  Last Updated: 21-Jan-16 17:22 by Manya Silvas (MD)

## 2014-10-01 NOTE — Consult Note (Signed)
Brief Consult Note: Diagnosis: suspect rupt ov cyst with fluid/heme in pelvis.   Patient was seen by consultant.   Consult note dictated.   Recommend further assessment or treatment.   Discussed with Attending MD.   Comments: Hx rev'd pt examined suspect ov cyst with pelvic fluid/heme observe discussed with Dr Jacqualine Code will follow if admitted.  Electronic Signatures: Florene Glen (MD)  (Signed 05-Jan-16 21:09)  Authored: Brief Consult Note   Last Updated: 05-Jan-16 21:09 by Florene Glen (MD)

## 2014-10-01 NOTE — Discharge Summary (Signed)
PATIENT NAME:  Ellen Blankenship, Ellen Blankenship MR#:  [id number] DATE OF BIRTH:  02/13/1981  DISCHARGE DIAGNOSIS:  Abdominal pain secondary to ischemic colitis, resolved.   DISCHARGE MEDICATIONS: Percocet 5/325 mg 1 tablet every 4 hours as needed for moderate pain, 30 tablets are given.  She is also on Ensure 200 mL 4 times daily.    CONSULTATIONS:  GI with Manya Silvas, MD, hematology/oncology consult with Kathlene November. Grayland Ormond, MD, and vascular consult with Algernon Huxley, MD.  HOSPITAL COURSE: This is a 34 year old female patient with no past medical history, came in because abdominal pain.  The patient had like a cramping abdominal pain in the left lower quadrant, 10/10 in severity, no alleviating factors. The patient had nonbilious vomiting in the ER and she was admitted to the hospitalist service for abdominal pain. The patient admitted for sepsis and CT abdomen showed moderate wall thickening with transverse colon and splenic flexure and colon with colitis.    The patient received IV fluids, kept n.p.o., started on Zosyn for intra-abdominal coverage. We continued pain medications as well.  The patient was seen by gastroenterology, Dr. Vira Agar. The patient also was seen by GYN. Her pregnancy test was negative. The patient also was seen by Dr. Burt Knack as well.  The patient continued to have left lower quadrant abdominal pain. CT of abdomen and pelvis which was done January 10, and it showed still transverse and splenic flexure colitis. Concerning this, the patient was taken to colonoscopy to evaluate for inflammatory bowel disease. The patient had a colonoscopy on January 11, which showed inflammation of distal transverse colon and some erosions were present involving the descending colon. The patient's colon looked like ischemic colitis with congestion, erythema, and diffuse inflammation of the distal transverse colon.  The patient's biopsy was consistent with ischemic colitis.  The patient was seen by Dr. Vira Agar  regularly.  She was given bowel rest for a few days and started on clear liquids. She also received a course of Zosyn. Regarding her ischemic colitis, there was some concern for hypercoagulable state, so she was seen by Dr. Grayland Ormond from oncology and also Dr. Lucky Cowboy. Dr. Grayland Ormond suggested hypercoagulable workup for antithrombin III, cryoglobulins, homocysteine levels.   The patient's lupus anticoagulant levels were normal and protein C levels and protein S levels were within normal range. The patient's antithrombin level was also  within normal range. The patient does not have any factor II mutation and factor V Leiden mutation was also not there.  The patient also had anticardiolipin antibodies, which were also negative.  Seen by Dr. Lucky Cowboy as well regarding her ischemic colitis. The patient is supposed to have mesenteric duplex with [facility] as an outpatient in 3-4 weeks. The patient felt better and I told her to continue low residue diet for about a week-10 days and follow up with Dr. Vira Agar and continue pain medications. The patient did not require any further antibiotics. Her white count also was initially at 17, but improved with IV antibiotics. The patient's WBC normalized on following days and WBC on January 11 was 4.7. The patient was afebrile.  She had some nausea with Flagyl, so we stopped the Flagyl.    The patient was discharged home in stable condition. Dr. Vira Agar agreed for that and the patient also wanted to go home, so told her to follow clearly diet restrictions and also stay away from work for about a week, and see Dr. Grayland Ormond in 1 week and Dr. Leotis Pain  in about 1-2 weeks and Dr. Vira Agar in a week.   PHYSICAL EXAMINATION:  DISCHARGE VITAL SIGNS: Temperature is 97.6, heart rate 80, blood pressure 130/80. GENERAL:  She was awake, alert, oriented.  CARDIOVASCULAR SYSTEM: S1, S2 regular.   ABDOMEN:  Soft.  Very minimal tenderness in left lower quadrant.   NEUROLOGIC:  No  focal neurological deficit.  TIME SPENT: More than 30 minutes.    ____________________________ Epifanio Lesches, MD sk:LT D: 06/18/2014 14:33:27 ET T: 06/18/2014 20:55:59 ET JOB#: [id number]  cc: Epifanio Lesches, MD, <Dictator> Manya Silvas, MD Kathlene November. Grayland Ormond, MD Algernon Huxley, MD  Epifanio Lesches MD ELECTRONICALLY SIGNED 07/04/2014 13:03

## 2014-10-01 NOTE — Consult Note (Signed)
Pt had results with suppository.  Pt on antibiotics for temp spike on 1/21.  Abd today less tender no palpable masses.  It is possible the fluid collection may have become infected and the antibiotics have caused an appropriate response.  Recommend continue with slow advance of diet and would strongly recommend a CT scan before considering discharge.  Consider ID consult for help with antibiotic course.  Electronic Signatures: Manya Silvas (MD)  (Signed on 25-Jan-16 18:42)  Authored  Last Updated: 25-Jan-16 18:42 by Manya Silvas (MD)

## 2014-10-01 NOTE — Consult Note (Signed)
PATIENT NAME:  Ellen Blankenship, Ellen Blankenship MR#:  [id number] DATE OF BIRTH:  12/16/1980  DATE OF CONSULTATION:  06/27/2014  REFERRING PHYSICIAN:  Manya Silvas, MD  CONSULTING PHYSICIAN:  Cheral Marker. Ola Spurr, MD  REASON FOR CONSULTATION: Fever and ischemic colitis.   HISTORY OF PRESENT ILLNESS: This is a very pleasant 34 year old female who was initially admitted January 5th through the 15th with the onset of sudden onset  abdominal pain while exercising in the cold. She was found to have an ischemic colitis of unclear etiology. She underwent a hypercoagulable work-up and a colonoscopy, which confirmed the ischemic colitis on biopsy. She was discharged home, but returned 2 days later with left upper quadrant pain. She was found to have a markedly elevated lipase at the time. The patient has been admitted now since the 17th, and has undergone vascular with an angiogram, which was negative, done on January 20th. She has also had an MRI of her abdomen. On the 21st, she spiked a high fever to 102, with what she describes as shaking chills and rigors. This persisted for several hours. She was started on vancomycin and Zosyn at that time and clinically, her fever has resolved. Currently, she has continued mild left upper quadrant abdominal pain. She also has some tenderness in her right groin, where the recent angiogram was performed. She is no longer having fevers, chills, night sweats, cough, nausea, or vomiting. She has had her 1st bowel movement yesterday after several days.   PAST MEDICAL HISTORY: 1. Ischemic colitis diagnosed earlier this month, as above.  2. History of prior pancreatitis many years ago, unsure of etiology.  4. History of vitamin D deficiency.   PAST SURGICAL HISTORY: Angiogram, as above.   SOCIAL HISTORY: She is married, has 2 children. Does not smoke, drink, or use drugs. She is an avid runner and fitness exerciser.   FAMILY HISTORY: Positive for coronary artery disease.   ALLERGIES:  SHE IS ALLERGIC TO SULFA DRUGS.  REVIEW OF SYSTEMS: Eleven systems reviewed and negative, except as per HPI.   ANTIBIOTICS SINCE ADMISSION: Include vancomycin and Zosyn, begun on January 21. Of note, her vancomycin was only given for 1 day, and she has not received any since January 21st.   PHYSICAL EXAMINATION: VITAL SIGNS: Temperature 98.3, pulse 62, blood pressure 96/61, respirations 18, saturations 93% on room air. Her last fever was on January 22nd, and it was 102.2.  GENERAL: She is thin, pleasant, interactive, sitting in a chair, in no acute distress.  HEENT: Pupils reactive. Sclerae anicteric. Oropharynx clear.  NECK: Supple. No anterior cervical, posterior cervical, or supraclavicular lymphadenopathy.  HEART: Regular.  LUNGS: Clear.  ABDOMEN: Soft, mild tender to palpation left upper quadrant. In her right groin, she has mild tenderness over her prior insertion site for the angiogram, although there is noted swelling or mass there.  EXTREMITIES: No clubbing, cyanosis, or edema.  SKIN: No rashes.  NEUROLOGIC: She is alert and oriented x 3. Grossly nonfocal neuro exam.   LABORATORY DATA:  White blood count on admission,  January 17th, was 7.9. Currently, it is 4.7, hemoglobin 12.6, platelets 317. Sedimentation rate was 48. Of note, on admission, January 5th, her white count was 20,000. LFTs show a mildly decreased albumin at 2.6, otherwise normal. Lipase had been elevated to 1000, January 17th. Currently, it is 721. Creatinine is normal. Blood cultures, January 21st: 2 of 2 negative. Urinalysis, January 17th, negative. C-reactive protein, January 21st, 5.3. Homocysteine level was normal. Anticardiolipin antibody testing was  normal. ANCA panel was negative. Antithrombin III levels were in the normal range. Beta 2 glycoprotein levels were normal. Prior globulins were not detected. Factor V Leiden was negative. Factor II mutation analysis was negative. ANA was negative. Lupus anticoagulant  comprehensive was normal. Protein C level was normal. Protein S level was normal. IgG subclass 4 was normal. Lactic acidosis level on January 5th was 1.7.   IMAGING STUDIES: Ultrasound of the right groin for pseudoaneurysm done January 25th showed elongated hypoechoic area anterior to the common femoral and proximal femoral arteries noted, measuring 5.8 x 0.6 x 1.1 cm, compatible with a small hematoma. Ultrasound of the right lower extremity was negative for deep DVT. Abdomen, January 24th, showed nonspecific bowel gas pattern. MRI of the abdomen, January 21st, showed bibasilar atelectasis with a small pleural effusion. There was some fluid and inflammation surrounding the lower thoracic aorta and esophagus. Hepatobiliary was normal. Pancreas was normal. The spleen was normal. There was some  inflammation and adenopathy in the gastrohepatic ligament, celiac axis, and retroperitoneum, most likely the sequela of recent transverse colitis. No definitive MRI findings for persistent transverse colitis. There were small bilateral pleural effusions and basilar atelectasis.   IMPRESSION: A 34 year old complicated case, admitted January 5th with abdominal pain and found to have acute mesenteric ischemia. She has had a work-up, including hypercoagulable work-up and angiogram, all negative. She was readmitted January 17th with pancreatitis, with elevated lipase and abdominal pain. She, then, had a fever on January 22nd up to 102. She has been covered with Zosyn and this has improved. At the time of the fever, she was also having shaking chills. This was 1 day after her angiogram.   I suspect she had temporary translocation of bacteria, probably from her ischemic colitis. This likely led to the fevers and rigors. She could also have had a fever from the hematoma in her right groin, although this was quite small. If the hematoma was infected, this could also cause fevers, although blood cultures and her white count have  been negative.   RECOMMENDATIONS: 1. I would suggest continue the Zosyn until discharge, then would suggest changing to Augmentin 875 twice a day. She has not been covered for MRSA now,  with her only getting 1 or 2 doses of vancomycin, so I do not feel the need to cover that aggressively; however, if needed, we could also discharge her on doxycycline.  2. She will need close follow-up to ensure no recurrence of fever or chills. She will also need to have her right groin hematoma clinically reassessed after stopping antibiotics to make sure there is not an abscess there.  3. Thank you for the consult. I will be glad to follow with you     ____________________________ Cheral Marker. Ola Spurr, MD dpf:mw D: 06/27/2014 14:53:56 ET T: 06/27/2014 16:09:58 ET JOB#: [id number]  cc: Cheral Marker. Ola Spurr, MD, <Dictator> Frida Wahlstrom Ola Spurr MD ELECTRONICALLY SIGNED 07/12/2014 20:47

## 2014-10-01 NOTE — Consult Note (Signed)
Chief Complaint:  Subjective/Chief Complaint seen for pancreatitis.  patient states her abdomen is feeling "alot " better today--2/10 pain/discomfort, mostly epigastric, some left abdomen, no n/v, tolerating clears.   VITAL SIGNS/ANCILLARY NOTES: **Vital Signs.:   24-Jan-16 08:15  Vital Signs Type Q 8hr  Celsius 36.8  Temperature Source oral  Pulse Pulse 55  Respirations Respirations 18  Systolic BP Systolic BP 694  Diastolic BP (mmHg) Diastolic BP (mmHg) 64  Mean BP 76  Pulse Ox % Pulse Ox % 95  Pulse Ox Activity Level  At rest  Oxygen Delivery Room Air/ 21 %   Brief Assessment:  Cardiac Regular   Respiratory clear BS   Gastrointestinal details normal Soft  Nondistended  No masses palpable  Bowel sounds normal  No rebound tenderness  mild tender to palpation in the epigastrum,, luq,llq   Lab Results: Hepatic:  24-Jan-16 04:38   Bilirubin, Total 0.6  Alkaline Phosphatase  33 (46-116 NOTE: New Reference Range 12/20/13)  SGPT (ALT) 25 (14-63 NOTE: New Reference Range 12/20/13)  SGOT (AST) 32  Total Protein, Serum  6.0  Albumin, Serum  2.9  Routine Chem:  17-Jan-16 11:16   Lipase  1030 (Result(s) reported on 18 Jun 2014 at 11:43AM.)  18-Jan-16 04:05   Lipase  595 (Result(s) reported on 19 Jun 2014 at 04:47AM.)  19-Jan-16 04:38   Lipase  625 (Result(s) reported on 20 Jun 2014 at 05:39AM.)  20-Jan-16 04:23   Lipase  648 (Result(s) reported on 21 Jun 2014 at 05:04AM.)  23-Jan-16 04:38   Lipase  780 (Result(s) reported on 24 Jun 2014 at 05:40AM.)  24-Jan-16 04:38   Lipase  748 (Result(s) reported on 25 Jun 2014 at 05:30AM.)  Glucose, Serum 71  BUN 7  Creatinine (comp) 0.86  Sodium, Serum 140  Potassium, Serum 3.8  Chloride, Serum 105  CO2, Serum 29  Calcium (Total), Serum 8.6  Osmolality (calc) 276  eGFR (African American) >60  eGFR (Non-African American) >60 (eGFR values <95mL/min/1.73 m2 may be an indication of chronic kidney disease (CKD). Calculated eGFR,  using the MRDR Study equation, is useful in  patients with stable renal function. The eGFR calculation will not be reliable in acutely ill patients when serum creatinine is changing rapidly. It is not useful in patients on dialysis. The eGFR calculation may not be applicable to patients at the low and high extremes of body sizes, pregnant women, and vegetarians.)  Anion Gap  6   Assessment/Plan:  Assessment/Plan:  Assessment 1) pancreatitis-ude.  improved sx today, clinically.  minimal change lipase.  2) recent episode of ischemic colitis fo uncertain etiology.   Plan 1) discussed with Dr Earleen Newport, agree with trial of full liquids to advance diet.  If increased/recurrent pain, may need to go back to npo with TPN.  Dr Vira Agar to be back tomorrow.   Electronic Signatures: Loistine Simas (MD)  (Signed 24-Jan-16 13:35)  Authored: Chief Complaint, VITAL SIGNS/ANCILLARY NOTES, Brief Assessment, Lab Results, Assessment/Plan   Last Updated: 24-Jan-16 13:35 by Loistine Simas (MD)

## 2014-10-01 NOTE — Consult Note (Signed)
Pt had better night with less pain, trying to manage on long acting pain pill and occasional short acting.  Had bowel movement with suppository.  Ate about 1/4 to 1/3 of supper per her description.  Plan for ID consult today.  will repeat CT of abd tomorrow.  Medical leave forms completed for her yesterday.  Electronic Signatures: Manya Silvas (MD)  (Signed on 26-Jan-16 07:42)  Authored  Last Updated: 26-Jan-16 07:42 by Manya Silvas (MD)

## 2014-10-01 NOTE — Consult Note (Signed)
Consulting Department: EmergencyPhysician: [facility] Question: acute onset abdominal pain, large amount of free fluid on ultrasound of Present Illness: 34 year old G2P2002 presenting for evalution to her PCP today with acute onset abdominal pain yesterday morning around 05:15 while jogging.  The pain initially started LUQ, before becoming more generalized.  Described as cramping, 6/10 in severity.  No radiation. Some nausea and emsis, with no emesis today though.  She did take some immodium after developing several loose stools, along with some pebto and well as gas-X.  No improvement with these measures.  She has been subjectively febrile and had chills.  Temperature at time of evaluation at PCP office was 100.4.  She has a remote history of pancreatitis 10 years ago.   was 05/30/2014 to 06/03/2013 not on any contraceptive currently. of Systems: 10 point review of sytems negative unless noted in HPI Medical History:Acute Pancreatitis Surgical History:ACL repair leftLeft elbow fracture Gyencologic History: no history of STI/STD Obsetric History: G2P2002 TSVD x 2 History: non-contributory History: Denies tobacco, EtOH, or illicit drug use Sulfa none Exam:97.4 BP 133/90 HR 133, RR 22 O2sat 97% RA Appears visually uncomfortable, slightly diaphoreticnomocephalic, anictericCTABtachycaric otherwise no adventitious heartsounds appreciatedHypoactive BS, non-distended, + rebound, + guardingDefferredno edmea, no erythemaA&O x 3, mood appropriate, affect full WBC 20.5K, H&H 18.0 & 53.7, Platelets [id number] only noteable abnormality is slightly elevated total bilirubin of 1.6. remainder of LFT's normal<11.7 moderate amount of free fluid and inflmmation of tranvserse colon and around splenic flecture 34 year old G2P2002 acute onset abdominal pain and free fluid a GYN standpoint does not appear to be secondary to ectopic pregnancy given negative HCG on arrival and unlikely to be  a rupture hemorrhagic cyst with hemoperitoneum given labs showing hemoconcentration and [facility] at [facility] today showing normal gyn structures.  Imaging appears to suggest some sort of colitis.  Recommend general surgery and GI evaluation      Electronic Signatures: Dorthula Nettles (MD)  (Signed on 05-Jan-16 20:26)  Authored  Last Updated: 05-Jan-16 20:26 by Dorthula Nettles (MD)

## 2014-10-01 NOTE — Consult Note (Signed)
PATIENT NAME:  Ellen Blankenship, Ellen Blankenship MR#:  [id number] DATE OF BIRTH:  [birth year]  DATE OF CONSULTATION:  06/19/2014  REFERRING PHYSICIAN:   CONSULTING PHYSICIAN:  Arther Dames, MD  REASON FOR CONSULTATION: Acute pancreatitis.    REFERRING PHYSICIAN:  Abel Presto, MD.    HISTORY OF PRESENT ILLNESS: Ellen Blankenship is a 34 year old female with a past medical history notable for a remote episode of pancreatitis, also with a recent hospital admission for ischemic colitis, who presented to the Emergency Room for evaluation of abdominal pain. Ellen Blankenship reported she continued to have some trouble with left-sided abdominal pain due to her ischemic colitis. However the day prior to presenting to the Emergency Room she woke up with a severe left upper quadrant and epigastric pain that radiated to her back. She did describe this as a stabbing-like pain. She is unsure if this was similar to the pain when she had her pancreatitis episode many years ago. The pain did come on after eating several pieces of pancake for breakfast.   Currently she is improved somewhat clinically. Her abdominal pain is still significant, but is decreased. Her lipase is also trending down and she did tolerate clear liquids this morning.   Of note on her previous admission she did have a CT scan that showed moderate wall thickening in the transverse colon. She did have a colonoscopy by Dr. Vira Agar on January 11 which did confirm ischemic colitis. The etiology of this was thought to be  aggressive running as the pain came on when she was running. She did have a hypercoagulable workup at that time.    PAST MEDICAL HISTORY:  1.  Pancreatitis years ago.  2.  Transverse colon ischemic colitis.   FAMILY HISTORY: No family history of GI malignancy.   ALLERGIES:  SULFA.   SOCIAL HISTORY: No alcohol or tobacco.   HOME MEDICATIONS: Percocet 5/325 one tablet every 4 hours as needed and multivitamin.  REVIEW OF SYSTEMS: A 10 system review was  conducted and is negative except as stated in the history of present illness.    PHYSICAL EXAMINATION:  VITAL SIGNS: Temperature is 98, pulse is 54, respirations are 14, blood pressure 94/56, pulse oximetry is 94% on room air.  GENERAL: Alert and oriented times 4.  No acute distress. Appears stated age. HEENT: Normocephalic/atraumatic. Extraocular movements are intact. Anicteric. NECK: Soft, supple. JVP appears normal. No adenopathy. CHEST: Clear to auscultation. No wheeze or crackle. Respirations unlabored. HEART: Regular. No murmur, rub, or gallop.  Normal S1 and S2. ABDOMEN: Normoactive bowel sounds. Significant tenderness to palpation in the left upper quadrant and the epigastric region and to the left of the umbilicus.   EXTREMITIES: No swelling, well perfused. SKIN: No rash or lesion. Skin color, texture, turgor normal. NEUROLOGICAL: Grossly intact. PSYCHIATRIC: Normal tone and affect. MUSCULOSKELETAL: No joint swelling or erythema.   LABORATORY DATA: Sodium 139, potassium 4.1, her creatinine is 0.96, her BUN is 16, glucose is 95. Her lipase was 1030 on presentation, it is now 600. Her liver enzymes are normal except her AST is 47. Her hemoglobin is 16, her hematocrit is 47, her platelets are 472,000. Her INR is 1.2. CT she had on January 10, there was wall thickening involving the distal transverse colon and splenic flexure suggesting infectious versus inflammatory colitis.   ASSESSMENT AND PLAN:  Acute pancreatitis: She did have an elevated lipase on presentation at 1000 although this has since trended down to 500. She is improving some clinically although she does  still have significant abdominal pain. She did tolerate clear liquids for breakfast.   She did have an episode of acute pancreatitis 8 years ago. She is unclear of the workup at that time.   RECOMMENDATIONS:  1.  Continue to manage conservatively with IV fluid and pain medication, advance diet as tolerated.  2.  Check IgG4  and triglycerides.  3.  Would obtain MRI of the pancreas in approximately 1 month once the inflammation from the pancreatitis has resolved to rule out other etiologies for the pancreatitis.  4.  Would also consider a cholecystectomy as an outpatient for recurrent pancreatitis although her ultrasound does not show evidence of gallstones. There is some evidence of decreasing the recurrence of pancreatitis with cholecystectomy even with patients without gallstones.   We will follow. Thank you for this consult.      ____________________________ Arther Dames, MD mr:bu D: 06/19/2014 16:20:36 ET T: 06/19/2014 17:08:21 ET JOB#: [id number]  cc: Arther Dames, MD, <Dictator> Mellody Life MD ELECTRONICALLY SIGNED 07/17/2014 15:12

## 2014-10-01 NOTE — Consult Note (Signed)
Pt has hx of 2 bouts of pancreatitis several years ago but was not admitted for them.She had pancreatitis of acute onset yesterday with lipase over a thousand and severe pain.  Dr,. Dew on case. Possible vascular disease which caused colon ischemia can cause pancreatitis also.  When her pancreatitis has subsided and edema gone ( maybe a month from now) an MRI would benefit her by showing if she has pancreas divisum which can occur in young patients and be a cause of pancreatitis. Will repeat lipase in morning and check IgG4 and triglycerides as were ordered. I will follow with you.  Electronic Signatures: Manya Silvas (MD)  (Signed on 18-Jan-16 17:50)  Authored  Last Updated: 18-Jan-16 17:50 by Manya Silvas (MD)

## 2014-10-01 NOTE — Consult Note (Signed)
Pt improving slightly, still somewhat swollen in abd, some tenderness.  chest clearer than before, better on left. Sed rate elevated. CRP not done??? to continue meds and repeat CT scan Sunday and possible colonoscopy Monday.  She started eating yogurt today, no vomiting.  VSS afebrile.  Repeat US showed less fluid.  Electronic Signatures: Manya Silvas (MD)  (Signed on 08-Jan-16 19:05)  Authored  Last Updated: 08-Jan-16 19:05 by Manya Silvas (MD)

## 2014-10-01 NOTE — H&P (Signed)
PATIENT NAME:  Ellen Blankenship, Ellen Blankenship MR#:  [id number] DATE OF BIRTH:  September 14, 1980  DATE OF ADMISSION:  06/18/2014  PRIMARY CARE PROVIDER: Vevelyn Francois, NP   CHIEF COMPLAINT: Left upper quadrant pain.   HISTORY OF PRESENTING ILLNESS: A 34 year old Caucasian female patient with history of pancreatitis many years back and recently diagnosed transverse colon ischemic colitis, discharged on 06/16/2014, returns with worsening pain. The patient mentions that she went home, she did have pain, was using her oral Percocet that she was given. Yesterday night this pain worsened to the point that 1 tablet did not work. She took an extra pill, in spite of which she had severe pain, had increasing tenderness on the left side on lying on that side along with nausea. No vomiting. The patient decided to present to the Emergency Room. Here, the patient has already received 2 doses of IV Dilaudid, has severe pain, rolling in bed. The patient's laboratory studies show a normal white count. She is afebrile, but her lipase is elevated at 1030. Urine pregnancy test is negative.   On reviewing recent CT scan of the abdomen and pelvis, the patient had transverse colon colitis. Colonoscopy showed signs of severe ischemic colitis. The patient also had hypercoagulable workup done, which is pending at this point.   The patient mentions that she has not taken any other medications. No sick contacts. No blood in her stool. This pain is persistent with occasional cramping which worsens the pain. No hematuria. Her menstrual cycles have been normal. Does not complain of any back pain.   PAST MEDICAL HISTORY:  1.  History of pancreatitis 8 years back of unknown etiology.  2.  Recently diagnosed transverse colon ischemic colitis.   FAMILY HISTORY: Aneurysm in her grandfather and CAD.   ALLERGIES:  SULFA.  SOCIAL HISTORY: The patient does not smoke. No alcohol. No illicit drug use. Has a desk job.   CODE STATUS: Full code.   HOME  MEDICATIONS:  1.  Percocet 5/325 one tablet every 4 hours as needed.  2.  Multivitamin pills.   REVIEW OF SYSTEMS: CONSTITUTIONAL: Complains of some fatigue.  EYES: No blurred vision, pain or redness.  EARS, NOSE AND THROAT: No tinnitus, ear pain, hearing loss. RESPIRATORY: No cough, wheeze, hemoptysis.  CARDIOVASCULAR: No chest pain, orthopnea, edema.  GASTROINTESTINAL: Has nausea. No vomiting. Has abdominal pain.  GENITOURINARY: No dysuria, hematuria or frequency.  ENDOCRINE: No polyuria, nocturia, thyroid problems.  HEMATOLOGIC AND LYMPHATIC: No anemia, easy bruising, bleeding. INTEGUMENTARY: No acne, rash, lesion.  MUSCULOSKELETAL: No back pain, arthritis.  NEUROLOGICAL: No focal numbness, weakness. PSYCHIATRY: No anxiety or depression.   PHYSICAL EXAMINATION:  VITAL SIGNS: Temperature 97.6, pulse 82, blood pressure 119/84, saturating 97% on room air. GENERAL: Moderately built Caucasian female patient lying in bed in significant distress secondary to abdominal pain.  PSYCHIATRIC: Alert, oriented x 3, anxious.  HEENT: Atraumatic, normocephalic. Oral mucosa dry and pink. External ears and nose normal. No pallor. No icterus. Pupils bilaterally equal and reactive to light.  NECK: Supple. No thyromegaly. No palpable lymph nodes. Trachea midline. No carotid bruit, JVD.  CARDIOVASCULAR: S1, S2, without any murmurs. Peripheral pulses 2+. No edema.  RESPIRATORY: Normal work of breathing. Clear to auscultation on both sides.  GASTROINTESTINAL: Soft abdomen. Tenderness in the left upper quadrant area with no rigidity or guarding. No hepatosplenomegaly palpable. No suprapubic tenderness.  GENITOURINARY: No CVA tenderness.  SKIN: Warm and dry. No petechiae, rash, ulcers.  MUSCULOSKELETAL: No joint swelling, redness, effusion of the  large joints. Normal muscle tone.  NEUROLOGICAL: Motor strength 5/5 in upper extremities. Sensation and motor intact all over.  LYMPHATIC: No cervical  lymphadenopathy.   LABORATORY STUDIES: Glucose of 95, BUN 16, creatinine 0.96, sodium 139, potassium 4.1 with lipase of 1030. AST, ALT, alk phos, bilirubin normal. WBC 7.9, hemoglobin 16, platelets of 472,000.   Recent CT scan showed transverse colon colitis.   ASSESSMENT AND PLAN:  1.  Worsening left upper quadrant pain in a patient with recently diagnosed transverse colon ischemic colitis. At this time, the patient's lipase is elevated at 1030,  possibility for acute pancreatitis with her history, but the elevated lipase could also be secondary to her transverse colitis. We will check an ultrasound of the abdomen. We will consult GI for further input with the case. We will control her pain with both IV and oral pain medications. The patient will be n.p.o. except ice and medications at this time.  2.  Deep venous thrombosis prophylaxis with Lovenox.   CODE STATUS: Full code.   TIME SPENT TODAY ON THIS CASE: Forty minutes.   ____________________________ Leia Alf Jedrek Dinovo, MD srs:TT D: 06/18/2014 13:29:53 ET T: 06/18/2014 14:39:12 ET JOB#: [id number]  cc: Alveta Heimlich R. Hallee Mckenny, MD, <Dictator> Vevelyn Francois, NP Alveta Heimlich Arlice Colt MD ELECTRONICALLY SIGNED 06/18/2014 18:44

## 2014-10-01 NOTE — Consult Note (Signed)
Pt improving, abd soft, minimal tenderness, no masses.  Could likely go home tomorrow on percocet oral pain meds, low residue diet.  Follow up with me in a week or so, to call and keep in close contact.  Electronic Signatures: Manya Silvas (MD)  (Signed on 14-Jan-16 17:08)  Authored  Last Updated: 14-Jan-16 17:08 by Manya Silvas (MD)

## 2014-10-01 NOTE — Op Note (Signed)
PATIENT NAME:  Ellen Blankenship, BUECHLER MR#:  [id number] DATE OF BIRTH:  July 06, 1980  DATE OF PROCEDURE:  06/21/2014  PREOPERATIVE DIAGNOSES: 1.  Ischemic colitis.  2.  Pancreatitis.   POSTOPERATIVE DIAGNOSES:   1.  Ischemic colitis.  2.  Pancreatitis.   PROCEDURES:  1.  Ultrasound guidance for vascular access, right femoral artery.  2.  Catheter placement into celiac, superior mesenteric artery, and inferior mesenteric artery from right femoral approach.  3.  Aortogram and selective celiac, superior mesenteric artery, and inferior mesenteric artery arteriograms.  4.  StarClose closure device, right femoral artery.   SURGEON: Algernon Huxley, MD   ANESTHESIA: Local with moderate conscious sedation.   ESTIMATED BLOOD LOSS: Minimal.   INDICATION FOR PROCEDURE: A 34 year old female who was admitted about 2 weeks ago with ischemic colitis. She went home and came back with recurrent pain and was found to have elevated pancreatic enzymes as well as her known ischemic colitis. There is question whether or not this could be a process that was due to poor perfusion of both pancreas and in the colon. I discussed options and an angiogram was considered for most detailed examination of the flow to the colon as well as to see if there is any obvious cause of the pancreatitis. She was very desirous to have this done as was her family. Risks and benefits were discussed. Informed consent was obtained.   DESCRIPTION OF PROCEDURE: The patient is brought to the vascular suite. Groins were shaved and prepped, and a sterile surgical field was created. The right femoral head was localized with fluoroscopy. Ultrasound was used to visualize a patent right femoral artery. It was accessed and draped ultrasound guidance without difficulty with Seldinger needle. A J-wire and 5 French sheath were then placed. A pigtail catheter was placed in the aorta several different projections were performed including AP, RAO, oblique and lateral.  This helped to opacify the origins of the 3 visceral vessels. The SMA was cannulated in the lateral projection, lateral imaging was performed, followed by AP imaging. This demonstrated pretty normal flow of the SMA, with absolutely no atherosclerosis. There was no evidence of dissection or embolization. There was no fibromuscular dysplasia or other odd cause. There were no signs of arteritis or other issues. There was normal flow in the middle colic as well as the other SMA branches. I also cannulated the celiac artery since she had pancreatitis to assess this as well. This had normal flow with normal hepatic and splenic and left gastric branches. The gastroduodenal came off the hepatic a normal fashion and went down to perfuse the pancreas.   I then turned my attention to the IMA. This was cannulated with a VS-1 catheter, after the SMA and celiac had been cannulated with a C2 catheter. Imaging through this showed normal IMA appearance without any significant atherosclerosis with the exception that there was not really a left colic branch of her IMA as the typical anatomy. Her branches essentially were sigmoid and rectosigmoid branches with a very small arcade going to the left colon and not a direct left colic branch. This did not appear to be occluded. 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   At this point, I elected to terminate the procedure. The diagnostic catheter was removed. Oblique arteriogram was performed of the right femoral artery and StarClose wrestling usual fashion with excellent hemostatic result.   The patient tolerated the procedure well and was taken to the recovery  room in stable condition. .    ____________________________ Algernon Huxley, MD jsd:nt D: 06/21/2014 16:12:09 ET T: 06/21/2014 19:28:19 ET JOB#: [id number]  cc: Algernon Huxley, MD, <Dictator> Algernon Huxley MD ELECTRONICALLY SIGNED 06/22/2014 10:42

## 2014-10-01 NOTE — H&P (Signed)
PATIENT NAME:  Ellen Blankenship, Ellen Blankenship MR#:  [id number] DATE OF BIRTH:  [birth year]  DATE OF ADMISSION:  06/06/2014  REFERRING PHYSICIAN:  Elta Guadeloupe R. Jacqualine Code, MD  PRIMARY CARE PHYSICIAN:  Vevelyn Francois, NP  CHIEF COMPLAINT:  Abdominal pain.   HISTORY OF PRESENT ILLNESS:  This is a 34 year old Caucasian female without significant past medical history, presenting with abdominal pain. She describes 1 day duration of abdominal pain with acute onset, left lower quadrant in location, cramping in quality, severe, 10 out of 10. No worsening or relieving factors. It radiates throughout the entirety of her abdomen, which was progressively worsening throughout the day, and she had associated nausea and vomiting with nonbloody, nonbilious emesis x 1 as well as watery diarrhea, 5 to 6 bouts in total. Denies any vaginal discharge or vaginal symptoms. She describes having subjective fevers and chills. She presented to her OB/GYN, who performed a transvaginal ultrasound and noted free fluid, thus sent to the hospital for further workup and evaluation. In the Emergency Department, she was actually evaluated by general surgery and OB/GYN, deemed no acute surgical issue, and deferred to medicine for further treatment.   REVIEW OF SYSTEMS: CONSTITUTIONAL:  Positive for subjective fevers and chills. Denies weakness or fatigue.  EYES:  Denies blurred vision, double vision, or eye pain.  EARS, NOSE, AND THROAT:  Denies tinnitus, ear pain, or hearing loss. RESPIRATORY:  Denies cough, wheeze, or shortness of breath.  CARDIOVASCULAR:  Positive for palpitations, however, denies any chest pain, orthopnea, or edema.  GASTROINTESTINAL:  Positive for nausea, vomiting, diarrhea, and abdominal pain as described above.  GENITOURINARY:  Denies dysuria, hematuria, or any vaginal discharge.  ENDOCRINE:  Denies nocturia or thyroid problems.  HEMATOLOGIC AND LYMPHATIC:  Denies easy bruising or bleeding.  SKIN:  Denies rashes or lesions.   MUSCULOSKELETAL:  Denies pain in the neck, back, shoulder, knees, or hips or arthritic symptoms.  NEUROLOGIC:  Denies any paralysis or paresthesias.  PSYCHIATRIC:  Denies anxiety or depressive symptoms.    Otherwise, a full review of systems performed by me is negative.   PAST MEDICAL HISTORY:  Remote history of pancreatitis; however, she is unsure of the initiating etiology. It was about 8 to 10 years ago. History of vitamin D deficiency. Otherwise, no further medical history.   SOCIAL HISTORY:  Denies any alcohol, tobacco, or drug usage.   FAMILY HISTORY:  Positive for coronary artery disease.   ALLERGIES:  SULFA DRUGS.   HOME MEDICATIONS:  Include multivitamin 1 tab p.o. daily, biotin 1 tab p.o. daily, vitamin D3 one tab p.o. daily. Unfortunately, she does not know the doses of these medications at this time.    PHYSICAL EXAMINATION: VITAL SIGNS:  Temperature 97.4, heart rate 128, respirations 22, blood pressure 133/90, saturating 97% on room air. Weight 54 kg, BMI 21.8.  GENERAL:  Well-nourished, well-developed, Caucasian female currently in minimal distress given abdominal pain.  HEAD:  Normocephalic, atraumatic.  EYES:  Pupils are equal, round, and reactive to light. Extraocular muscles are intact. No scleral icterus.  MOUTH:  Dry mucosal membranes. Dentition is intact. No abscess noted.  EARS, NOSE, AND THROAT:  Clear without exudates. No external lesions.  NECK:  Supple. No thyromegaly. No nodules. No JVD.  PULMONARY:  Clear to auscultation bilaterally without wheezes, rubs, or rhonchi. No use of accessory muscles. Good respiratory effort.  CHEST:  Nontender to palpation.  CARDIOVASCULAR:  S1 and S2, regular rate and rhythm. No murmurs, rubs, or gallops. No edema. Pedal pulses are  2+ bilaterally.   GASTROINTESTINAL:  Soft. Tenderness to palpation in the left lower quadrant with voluntary guarding; however, no rebound or motion tenderness. Positive bowel sounds present. No  hepatosplenomegaly.  MUSCULOSKELETAL:  No swelling, clubbing, or edema. Range of motion is full in all extremities.  NEUROLOGIC:  Cranial nerves II through XII are intact. No gross focal neurological deficits. Sensation is intact. Reflexes are intact.  SKIN:  No ulcerations, lesions, rash, or cyanosis. Skin is warm and dry. Turgor is intact.  PSYCHIATRIC:  Mood and affect are within normal limits. The patient is awake, alert, and oriented x 3. Insight and judgment are intact.   LABORATORY DATA:  Sodium is 133, potassium 4.2, chloride 101, bicarbonate 24, BUN 25, creatinine 0.96, glucose 114. Beta-hCG is less than 1. LFTs:  Albumin 3.2, bilirubin 1.6, otherwise within normal limits. WBC is 20.5, hemoglobin 18, and platelets are 226,000. Lactic acid is 1.2.   Chest x-ray performed reveals no acute cardiopulmonary process. CT of the abdomen and pelvis performed, which reveals moderate wall thickening involving the transverse and splenic flexure of the colon, consistent with colitis; right ovarian corpus luteal cysts; trace abdominal and small volume pelvic fluid; concurrent mild peritoneal thickening; question secondary to colitis versus recent cyst rupture.   ASSESSMENT AND PLAN:  This is a 34 year old Caucasian female without significant past medical history, presenting with abdominal pain and found to have free fluid in the abdomen and pelvis.   1.  Sepsis. Meeting septic criteria by heart rate, respiratory rate, and leukocytosis present on arrival, noted moderate amount of free fluid in the pelvis, question ovarian cyst versus colitis. She has been evaluated by OB/GYN and general surgery, who deferred treatment to medicine at that time. She has been pancultured including blood and urine. We will provide IV fluid hydration and keep mean arterial pressure greater than 65, antibiotic coverage with Zosyn to provide good intra-abdominal coverage, provide pain control as required.  2.  Hyponatremia. IV fluid  hydration. Follow sodium level.  3.  Venous thromboembolism prophylaxis with sequential compression devices.  CODE STATUS:  The patient is a full code.   TIME SPENT:  55 minutes.    ____________________________ Aaron Mose. Hower, MD dkh:nb D: 06/06/2014 22:29:22 ET T: 06/06/2014 23:14:09 ET JOB#: [id number]  cc: Aaron Mose. Hower, MD, <Dictator> DAVID Woodfin Ganja MD ELECTRONICALLY SIGNED 06/13/2014 20:38

## 2014-10-01 NOTE — Consult Note (Signed)
Spent time talking with patient about different things after discharge.  She should take a week at least off from work, would not want to work or drive if under the influence of needed pain medicine.  With 9 days of antibiotics can likely stop them now.  Bx showed only ischemic colitis. Heme consult noted.  Will need vascular surgery as out pt for Doppler studies.  Advised minimal physical activity, avoid lifting 34 year old for now if possible. Diet to be advanced slowly.  Would like to see her manage pain without parenteral meds before discharge. Dr. Delana Meyer notified.  Electronic Signatures: Manya Silvas (MD)  (Signed on 13-Jan-16 16:57)  Authored  Last Updated: 13-Jan-16 16:57 by Manya Silvas (MD)

## 2014-10-01 NOTE — Consult Note (Signed)
Pt ate more food and had abd pain and some diarrhea last night.  She also drank a bottle of prep.  T max 99.7 this morning. She is to get CT of abd today and possible colonoscopy tomorrow. Awaiting CT report.  Electronic Signatures: Manya Silvas (MD)  (Signed on 10-Jan-16 11:25)  Authored  Last Updated: 10-Jan-16 11:25 by Manya Silvas (MD)

## 2014-10-01 NOTE — Consult Note (Signed)
[place] today I saw patient, feeling better, less pain, feeling somewhat bloated, could use a suppository or Fleets to help with bowel movements.  Will try to advance diet to low residue and try to keep bowels moving.    Electronic Signatures: Manya Silvas (MD)  (Signed on 25-Jan-16 18:15)  Authored  Last Updated: 25-Jan-16 18:15 by Manya Silvas (MD)

## 2014-10-01 NOTE — Consult Note (Signed)
VSS afebrile.  Pt doing better today.  Had BM yesterday. Abd less tender but still somewhat tender, less distended than yesterday.  Plan repeat CT for tomorrow.  Probable colonoscopy Monday.  Electronic Signatures: Manya Silvas (MD)  (Signed on 09-Jan-16 16:25)  Authored  Last Updated: 09-Jan-16 16:25 by Manya Silvas (MD)

## 2014-10-01 NOTE — Discharge Summary (Signed)
PATIENT NAME:  Ellen Blankenship, Ellen Blankenship MR#:  [id number] DATE OF BIRTH:  Jul 20, 1980  DATE OF ADMISSION:  06/18/2014 DATE OF DISCHARGE:  06/28/2014  PRIMARY CARE PHYSICIAN: Vevelyn Francois, NP   GASTROENTEROLOGIST: Manya Silvas, MD  FINAL DIAGNOSES: 1. Acute pancreatitis.  2. Ischemic colitis.  3. Febrile episode.  4. Right groin pain post procedure with hematoma.   MEDICATIONS ON DISCHARGE: Include prenatal vitamin 1 tablet daily, OxyContin 10 mg 2 tablets every 12 hours for 1 week, then 1 tablet every 12 hours daily for 10 days, Percocet 10/325 one tablet every 6 hours as needed for moderate pain, Zofran 4 mg every 8 hours as needed for nausea, MiraLax 17 grams once a day for 30 days as needed for constipation, Augmentin 875 mg twice a day for 5 more days, lactobacillus 1 capsule twice a day for 20 days, omeprazole 20 mg daily, nystatin swish and swallow 5 mL every 6 hours for 7 days.   DIET: Regular consistency, bland diet. Eat 7 small meals per day.   ACTIVITY: As tolerated.   FOLLOWUP CARE: Follow-up with Dr. Vira Agar on Monday at 1:00 p.m. In 1-2 weeks with Charlies Constable, NP.  HOSPITAL COURSE: The patient was admitted 06/18/2014 and discharged 06/28/2014. Came in with left upper quadrant abdominal pain. The patient was recently in the hospital for transverse colon ischemic colitis. This time, the patient's lipase elevated at 1030. The patient was admitted with acute pancreatitis and worsening abdominal pain.   CONSULTANTS DURING THE HOSPITAL COURSE: Included Dr. Thurmond Butts initially and then Dr. Vira Agar, gastroenterology; Dr. Ola Spurr, infectious disease.   LABORATORY AND RADIOLOGICAL DATA: DURING HOSPITAL COURSE: Included: a lipase 1030, white blood count 7.9, hemoglobin and hematocrit 16.2 and 47.3, platelet count of 272, glucose 95, BUN 16, creatinine 0.96, sodium 139, potassium 4.1, chloride 102, CO2 of 28, calcium 9.6. Liver function tests: AST slightly elevated at 47. Urinalysis: Negative.  Ultrasound of the abdomen: Unremarkable. Lipase the next day, down to 595 and the next day up at 625. Triglycerides 129, IgG subclass 424. Blood cultures: Negative. MRI of the abdomen with and without contrast: Showed fluid inflammation and adenopathy in the gastrohepatic ligament, celiac axis, and retroperitoneum, most likely sequelae of the patient's recent transverse colitis. No findings to suggest acute pancreatitis. Blood cultures were negative. C-reactive protein 5.3, sedimentation rate 48. ANA came back positive this time., Lipase stayed borderline the entire hospital course. ANA comprehensive panel: All negative. Ultrasound of the right groin: Negative for pseudoaneurysm. They saw a hematoma. Ultrasound of the right lower extremity: Negative for DVT. Pregnancy test: Negative. Lipase upon discharge 721, creatinine 0.96, sodium 144, potassium 4.0. CT of the abdomen with contrast: Showed resolution of the previously described colitis, redemonstration of mild gastrohepatic ligament adenopathy, nonspecific.Marland Kitchen Concurrent chronic edema around the celiac axis and remainder of the retroperitoneum, which is nonspecific. May relate to fluid overload state given suggesting of septal thickening at the lung bases.   HOSPITAL COURSE PER PROBLEM LIST:  1. Acute pancreatitis. The patient was on liquid diet for a long period of time. The patient continued to have pain during most of the hospital stay, but at the end of the hospital course, she was tolerating diet and felt well enough to go home. Pain was controlled with the long-acting pain medications, and she was hardly using the short-acting pain medications. The patient deferred any treatment with TPN during the hospital course. The patient's lipase remained borderline during the entire hospital course. Will need follow-up as an  outpatient.  2. Ischemic colitis, recent hospitalization. The patient did have a febrile episode also, and was started on Zosyn. Will be  continued on Augmentin. Repeat CAT scan showed right resolution of the ischemic colitis.  3. Febrile episode. The patient was started on Zosyn and will be continued on Augmentin. No further episodes of fever after that initial fever.  4. Right groin pain post procedure. This is a hematoma. Ultrasound negative for deep vein thrombosis and negative for pseudoaneurysm. Follow up as outpatient.   TIME SPENT ON DISCHARGE: 35 minutes     ____________________________ Tana Conch. Leslye Peer, MD rjw:mw D: 06/29/2014 11:48:33 ET T: 06/29/2014 16:26:06 ET JOB#: [id number]  cc: Tana Conch. Leslye Peer, MD, <Dictator> Vevelyn Francois, NP Manya Silvas, MD Marisue Brooklyn MD ELECTRONICALLY SIGNED 06/30/2014 14:43

## 2014-10-01 NOTE — Consult Note (Signed)
Pt colonoscopy showed probable ischemic colitis in distal transverse colon with very ugly mucosa with edema, erythema.  Bx done.  Pt husband would like her to have surgery at [facility] if she has to have it done because that is where he had surgery for Crohn's disease and liked the surgeon.  Electronic Signatures: Manya Silvas (MD)  (Signed on 11-Jan-16 16:08)  Authored  Last Updated: 11-Jan-16 16:08 by Manya Silvas (MD)

## 2014-10-01 NOTE — Consult Note (Signed)
Chief Complaint:  Subjective/Chief Complaint seen for pancreatitis and recent ischemic colitis.   stable, slow improvement of abdominal pain, not distended, passing flatus, tolerating clears.  no n/v.   VITAL SIGNS/ANCILLARY NOTES: **Vital Signs.:   22-Jan-16 07:54  Temperature Temperature (F) 98.2  Celsius 36.7  Temperature Source oral  Pulse Pulse 53  Respirations Respirations 18  Systolic BP Systolic BP 86  Diastolic BP (mmHg) Diastolic BP (mmHg) 51  Mean BP 62  Pulse Ox % Pulse Ox % 94  Pulse Ox Activity Level  At rest  Oxygen Delivery Room Air/ 21 %    08:26  Vital Signs Type Recheck  Systolic BP Systolic BP 91  Diastolic BP (mmHg) Diastolic BP (mmHg) 53  Mean BP 65   Brief Assessment:  Cardiac Regular   Respiratory clear BS   Gastrointestinal details normal Soft  Nondistended  Bowel sounds normal  No rebound tenderness  tender to palpation in the epigastrum to luq to luq to llq.   Lab Results: Routine Micro:  21-Jan-16 23:40   Micro Text Report BLOOD CULTURE   COMMENT                   NO GROWTH IN 8-12 HOURS   ANTIBIOTIC                       Culture Comment NO GROWTH IN 8-12 HOURS  Result(s) reported on 23 Jun 2014 at 07:00AM.    23:50   Micro Text Report BLOOD CULTURE   COMMENT                   NO GROWTH IN 8-12 HOURS   ANTIBIOTIC                       Culture Comment NO GROWTH IN 8-12 HOURS  Result(s) reported on 23 Jun 2014 at 07:00AM.  General Ref:  19-Jan-16 04:38   IgG, Subclass 4 ========== TEST NAME ==========  ========= RESULTS =========  = REFERENCE RANGE =  IGG SUBCLASS 4  IgG, Subclass 4 IgG, Subclass 4                 [   24 mg/dL             ]             1-291               [facility]            No: [id number]           [address], [place], [place] [address]           Lindon Romp, MD         [id number]   Result(s) reported on 21 Jun 2014 at 08:18AM.  Routine Chem:  20-Jan-16 04:23   Lipase  648  (Result(s) reported on 21 Jun 2014 at 05:04AM.)  21-Jan-16 05:10   BUN 9  Creatinine (comp) 0.85  Sodium, Serum 138  Potassium, Serum 3.9  Chloride, Serum 105  CO2, Serum 22  Calcium (Total), Serum 8.5  Anion Gap 11  Osmolality (calc) 273  eGFR (African American) >60  eGFR (Non-African American) >60 (eGFR values <17m/min/1.73 m2 may be an indication of chronic kidney disease (CKD). Calculated eGFR, using the MRDR Study equation, is useful in  patients with stable renal function. The eGFR calculation will not be reliable in acutely ill patients  when serum creatinine is changing rapidly. It is not useful in patients on dialysis. The eGFR calculation may not be applicable to patients at the low and high extremes of body sizes, pregnant women, and vegetarians.)  Routine Hem:  21-Jan-16 23:45   WBC (CBC) 8.4  Erythrocyte Sed Rate  48 (Result(s) reported on 23 Jun 2014 at 04:05AM.)  RBC (CBC)  3.69  Hemoglobin (CBC) 12.6  Hematocrit (CBC) 36.4  Platelet Count (CBC) 317  MCV 99  MCH  34.3  MCHC 34.7  RDW 12.3  Neutrophil % 82.6  Lymphocyte % 9.1  Monocyte % 7.4  Eosinophil % 0.5  Basophil % 0.4  Neutrophil #  6.9  Lymphocyte #  0.8  Monocyte # 0.6  Eosinophil # 0.0  Basophil # 0.0 (Result(s) reported on 23 Jun 2014 at 03:41AM.)   Radiology Results: [place]:    17-Jan-16 16:24, US Abdomen Limited Survey  US Abdomen Limited Survey   REASON FOR EXAM:    Upper abd pain  COMMENTS:   May transport without cardiac monitor    PROCEDURE: [place]  - US ABDOMEN LIMITED SURVEY  - Jun 18 2014  4:24PM     CLINICAL DATA:  Upper abdominal pain.    EXAM:  US ABDOMEN LIMITED - RIGHT UPPER QUADRANT    COMPARISON:  06/11/2014 CT    FINDINGS:  Gallbladder:  No gallstones or wall thickening visualized. No sonographic Murphy  sign noted.    Common bile duct:    Diameter: Normal caliber, 5 mm    Liver:    No focal lesion identified. Within normal limits in parenchymal  echogenicity.      IMPRESSION:  Unremarkable right upper quadrant ultrasound.  Electronically Signed    By: Rolm Baptise M.D.    On: 06/18/2014 16:41         Verified By: Raelyn Number, M.D.,  MRI:    21-Jan-16 08:58, MRI Abdomen WWO  MRI Abdomen WWO   REASON FOR EXAM:    Acute pancreatitis.  Ischemic colitis.  Please   evaluate.  COMMENTS:       PROCEDURE: MR  - MR ABDOMEN WO/W CONTRAST  - Jun 22 2014  8:58AM     CLINICAL DATA:  Left upper quadrant abdominal pain for 4 days.  History of transversecolitis.    EXAM:  MRI ABDOMEN WITHOUT AND WITH CONTRAST    TECHNIQUE:  Multiplanar multisequence MR imaging of the abdomen was performed  both before and after the administration of intravenous contrast.  CONTRAST:  10 cc MultiHance    COMPARISON:  CT scans 1/5 and 06/11/2016    FINDINGS:  Lower chest: Bibasilar atelectasis noted along with small pleural  effusions. The heart is normal in size. No pericardial effusion.  There is some fluid and inflammation surrounding the lower thoracic  aorta and esophagus.    Hepatobiliary: No hepatic lesions or intrahepatic biliary  dilatation. Normal caliber and course of the common bile duct. The  gallbladder is unremarkable.    Pancreas: Normal caliber and course of the main pancreatic duct. No  findings for acute pancreatitis or pancreatic lesion.    Spleen: The spleen is normal in size.  No focal lesions.    Adrenals/Urinary Tract: The adrenal glands and kidneys are  unremarkable. There is a prominent extra renal pelvis on the left  side.    Stomach/Bowel: The stomach, duodenum, visualized small bowel and  visualized colon are grossly normal. I do not see any obvious  persistent findings  of transverse colitis.    Vascular/Lymphatic: There is fluid and enlarged lymph nodes in the  gastrohepatic ligament, celiac axis and retroperitoneum. This is  likely the sequela of the patient's recent transverse colitis. I do  not see any evidence of  acute pancreatitis.    The aorta and branch vessels appear normal. The major venous  structures are patent.    Other:    Musculoskeletal: No significant bony findings.     IMPRESSION:  1. Fluid/inflammation and adenopathy in the gastrohepatic ligament,  celiac axis and retroperitoneum. This is most likely the sequela of  the patient's recent transverse colitis. I do not see any findings  to suggest acute pancreatitis. A followup abdominal CT scan with  contrast and 6 to 8 weeks may be helpful to reassess.  2. No definite MR findings for persistent transverse colitis.  3. Small bilateral pleural effusions and bibasilar atelectasis.      Electronically Signed    By: Kalman Jewels M.D.    On: 06/22/2014 09:31         Verified By: Marlane Hatcher, M.D.,   Assessment/Plan:  Assessment/Plan:  Assessment 1) intermittant/recurrent pancreatitis of uncertain etiology. improving labs, slow improvement of abdominal pain. of note "normal course and caliber of the main pancreatic duct".  2) recent ischemic colitis, Fluid/inflammation and adenopathy in the gastrohepatic ligament, celiac axis and retroperitoneum per MRI felt to be related to this   Plan 1) continue current, recheck lipase in am, will check ANA.  Recommend very slow advancement of diet, continue clears and current for now.   Electronic Signatures: Loistine Simas (MD)  (Signed 22-Jan-16 16:17)  Authored: Chief Complaint, VITAL SIGNS/ANCILLARY NOTES, Brief Assessment, Lab Results, Radiology Results, Assessment/Plan   Last Updated: 22-Jan-16 16:17 by Loistine Simas (MD)

## 2014-10-01 NOTE — Consult Note (Signed)
Pt ate some supper, had stool today mushy and almost loose.  She is on the docusate and senna.  Her site of arteriogram is tender but no hematoma and no discoloration.  Plan to get repeat CT tomorrow and probably can go home the following day on Augmentin per ID recommendation.  Would start Augmentin tomorrow to be sure she tolerates it.  Electronic Signatures: Manya Silvas (MD)  (Signed on 26-Jan-16 17:21)  Authored  Last Updated: 26-Jan-16 17:21 by Manya Silvas (MD)

## 2014-10-01 NOTE — Consult Note (Signed)
Pt with some pain requiring po and iv pain med.  We want to move very slowly on her due to severity of the bowel disease and not discharge her until she is on stable diet and predictable amt of pain meds.  She may need a few more days in the hospital.  Plan heme onc consult and vascular surgery consult, will want to get out pt abd Doppler studies. Will advance to full liquids and go from there.  Electronic Signatures: Manya Silvas (MD)  (Signed on 12-Jan-16 12:11)  Authored  Last Updated: 12-Jan-16 12:11 by Manya Silvas (MD)

## 2014-10-01 NOTE — Consult Note (Signed)
PATIENT NAME:  Ellen Blankenship, Ellen Blankenship MR#:  [id number] DATE OF BIRTH:  [birth year]  DATE OF CONSULTATION:  06/07/2014  REFERRING PHYSICIAN:   CONSULTING PHYSICIAN:  Joelene Millin A. Jerelene Redden, ANP (Adult Nurse Practitioner)  REFERRING PHYSICIAN: Dr. Larrie Kass PHYSICIAN: Gaylyn Cheers, MD/Mardy Lucier Jerelene Redden, ANP (adult nurse practitioner)   PRIMARY CARE PHYSICIAN: Vevelyn Francois, NP  REASON FOR CONSULTATION: Acute colitis.   HISTORY OF PRESENT ILLNESS: This 34 year old female patient reports she was in her usual state of health until she went running yesterday morning at 0515. She is a typical exerciser, although she had not been exercising for the month of December due to low back pain. The patient was 1.5 miles into her run when she developed  stomach distention, bloating, cramps with mild, vague nausea. The cramps became progressive. She went home, vomited once. Severe cramps continued, and she had small bowel movements every 10 minutes all day long. She tried Gas-X and Pepto-Bismol without improvement. There was no blood noted. She does recall a fever of 100.4, with some chills.   Yesterday, she went to her GYN for continued symptoms, and a transvaginal ultrasound showed free fluid. She was sent to the Emergency Room for further evaluation. A CT of the abdomen showed moderate wall thickening involving the transverse and splenic flexure of the colon consistent with infectious colitis. There was a right ovarian corpus luteal cyst. There was trace abdominal and small volume pelvic fluid. There was small bowel with nonobstructive ventral abdominal wall laxity. There was trace pericardial fluid with lung base septal thickening, question fluid overload.   The patient was seen by a gynecologist with negative ovarian findings. Her beta-hCG is negative. The patient's admitting WBC was elevated at 20.5 and she did have an elevated lactic acid 1.7. She met criteria for sepsis and was admitted to the critical care  unit. Her blood cultures returned negative. Overnigh, t with IV hydration and Zosyn antibiotic, her laboratory studies improved. Abdominal discomfort has improved somewhat, although she still has abdominal distention and tenderness. The patient says she is improving. She has had no further bowel movements since Monday at 2130.   The patient denies any antibiotic exposure prior to admission. She did have low back pain and took about 3 doses of meloxicam in December. She was taking ibuprofen 400 mg every 4 hours for about a week or so in December. She has taken " a few NSAIDs"  since December. The patient drinks city water. She does recall a prior history of pancreatitis in 2008 without obvious reason. She said there were no gallstones found. She does have intact gallbladder. She denies any history of pancreatitis since. She denies alcohol or drug use. She says she is a healthy eater, following a paleo diet. She denies any suspicious foods. No family members are ill. No prior history of colonoscopy.   PAST MEDICAL HISTORY:  1. History of pancreatitis, etiology unclear, 2008.  2. History of vitamin D deficiency.   PAST SURGICAL HISTORY:  1. Left ACL reconstruction.  2. Left elbow surgery, age 24. 70. Wisdom teeth removed.  4. Two vaginal delivery pregnancies, uncomplicated.  5. No abdominal surgery.   HOME MEDICATIONS:  1. Multivitamin daily.  2. Biotin 1 tablet daily.  3. Vitamin D 3 daily.  4. Magnesium supplement.,  5. Calcium supplement.  6. She is off of her birth control pills.  7. Meloxicam about 3 doses in December.  8. Ibuprofen as needed.   SOCIAL HISTORY: The patient is married,  denies alcohol, tobacco or drug use.   FAMILY HISTORY:  1.  Maternal grandmother with Wegener granulomatosis.  2.  Paternal grandfather with colon cancer.   REVIEW OF SYSTEMS: Ten systems reviewed and pertinent positives noted in history of present illness. She denies dysuria, hematuria. No significant  complaints of back pain. She came in with tachycardia that has improved. She denies chest pain or shortness of breath. Remaining 10 systems negative.   PHYSICAL EXAMINATION:  VITAL SIGNS: Temperature 98, pulse rate 74, respiratory rate 14, blood pressure 101/67, pulse oximetry on room air is 98%.  GENERAL: Pale, thin, Caucasian female, resting in bed. She is visiting and laughing with family members. She appears relaxed and talkative.  HEENT: Head is normocephalic. Conjunctivae pink. Sclerae anicteric. Oral mucosa is moist and intact.  NECK: Supple. Trachea is midline.  CARDIAC: S1, S2 without murmur or gallop.  LUNGS: CTA. Breath sounds are nonlabored.  ABDOMEN: Soft,  distended, positive tenderness in all quadrants with gentle palpation. LLQ >RLQ. Small umbilical hernia-non tender. Diastis recti. Bowel sounds are hypoactive but present.  RECTAL: Deferred.  SKIN: Warm and dry without rash or edema. There ismild puffiness to her face- reports family. Skin color is pale. Mucous membranes are pink.  MUSCULOSKELETAL: No joint swelling or inflammation. The patient is ambulatory to the bathroom. Gait not evaluated.  NEUROLOGIC: Cranial nerves II through XII intact. Good historian.  PSYCHIATRIC: Affect and mood within normal. Relaxed.   LABORATORY DATA: Admission blood work notable for BUN 25, creatinine 0.96, sodium 133, albumin 3.2, total bilirubin 1.6, alkaline phosphatase 56, ALT is 11. WBC 20.5, hemoglobin 18, platelet count 226,000, MCV 95. Pro time is 15.1, INR 1.2, PTT 31.7. Blood culture negative growth. Urinalysis, 1+ ketones, 1+ leukocyte esterase, 45 WBC per high-power field, trace bacteria, negative nitrites, 10 epithelial cells per high-power field, positive mucus, lactic acid 1.7.   Laboratory studies this morning with BUN 19, creatinine 0.94, albumin 2.1, total protein is 5.0, total bilirubin 1.5, alkaline phosphatase 40, AST 16, ALT 8. WBC 12.3, down to 11.3, hemoglobin 12.7 to 13.8.    RADIOLOGY: Chest x-ray, PA and lateral, 06/06/2013, showed lung fields are clear. Heart and  mediastinal contours are within normal. No effusion. No acute cardiopulmonary findings.   CT of the abdomen and pelvis with contrast performed 06/06/2014, shows mild interstitial thickening at the lung bases, possibly related to fluid overload, normal heart size, trace pericardial fluid. No pleural fluid. Mild hepatic steatosis. Focal steatosis adjacent to the falciform ligament. Normal gallbladder without biliary ductal dilatation. Pancreas is normal without mass or pancreatic ductal dilatation. Spleen is normal. Normal stomach. Rectum and sigmoid are within normal.   Moderate to marked wall thickening involving the transverse colon and splenic flexure. Normal terminal ileum. The appendix was not well-visualized. There were mildly prominent gastric hepatic ligament nodes, likely related to steatosis. Normal uterus, suspect right ovarian corpus luteal cyst at 9 mm. Small to moderate volume pelvic fluid. Thickening of the peritoneal surface within the cul-de-sac. This is mild. No evidence of hematoma or intraperitoneal hemorrhage.   IMPRESSION: This young, 34 year old was in her usual health until midway in a run when she developed acute abdominal distention, mild bloating, progressive worsening cramps, nausea with 1 episode of nonbloody vomiting. She did have some loose stools to small pieces of stool every 10 minutes, with a tenesmus-type sensation. She has had no further bowel movements since Monday night. The CT shows colitis involving the transverse and splenic flexure colon, but normal-appearing rectum  and sigmoid colon. The patient can recall a history of runner's trots with diarrhea and cramps in the past when she was getting ready for a marathon run. She does have exposure to NSAIDs last month. Etiology for her acute GI findings to consider: Infection, ischemia, NSAID-induced colitis.   PLAN:  1.  We  will check liver panel to evaluate for Gilbert's.  2.  Stool cultures have not been ordered. The patient does not have diarrhea. She is on IV Zosyn. Abdominal discomfort is reportedly improved from her presentation yesterday. The patient still appears to have significant abdominal tenderness  to my exam. There is distention. Dr. Burt Knack from surgery saw her yesterday. Dr. Georgianne Fick from gynecology saw her today.  3.  Would continue to follow the patient clinically, add stool studies if she develops diarrhea but may not be accurate with the antibiotic exposure.   I will discuss this case with Dr. Vira Agar regarding further GI imaging recommendations.   Thank you for this consultation. These services provided by [facility].   ____________________________ Janalyn Harder Jerelene Redden, ANP (Adult Nurse Practitioner) kam:je D: 06/07/2014 15:45:00 ET T: 06/07/2014 16:59:41 ET JOB#: [id number]  cc: Joelene Millin A. Jerelene Redden, ANP (Adult Nurse Practitioner), <Dictator> Janalyn Harder Sherlyn Hay, MSN, ANP-BC Adult Nurse Practitioner ELECTRONICALLY SIGNED 06/07/2014 18:04

## 2014-10-01 NOTE — Consult Note (Signed)
Talked with Hospitalist and will try oral supplements for nutrition with slow advancement.  Pt had arteriogram showing no arterial blockages. MRI tomorrow.  Electronic Signatures: Manya Silvas (MD)  (Signed on 20-Jan-16 17:36)  Authored  Last Updated: 20-Jan-16 17:36 by Manya Silvas (MD)

## 2014-10-01 NOTE — Consult Note (Signed)
Chief Complaint:  Subjective/Chief Complaint seen for acute pancreatitis.  some mild increase epigastric discomfort today after full liquids.  no emesis, mild nausea.  no bm.   VITAL SIGNS/ANCILLARY NOTES: **Vital Signs.:   23-Jan-16 08:21  Temperature Temperature (F) 98.1  Celsius 36.7  Temperature Source oral  Pulse Pulse 64  Respirations Respirations 18  Systolic BP Systolic BP 619  Diastolic BP (mmHg) Diastolic BP (mmHg) 64  Mean BP 76  Pulse Ox % Pulse Ox % 95  Pulse Ox Activity Level  At rest  Oxygen Delivery Room Air/ 21 %   Brief Assessment:  Cardiac Regular   Respiratory clear BS   Gastrointestinal details normal Soft  Nondistended  No masses palpable  Bowel sounds normal  No rebound tenderness  mild epigastric and luq/llq dsicomfort to palpation.   Lab Results: Routine Micro:  21-Jan-16 23:40   Micro Text Report BLOOD CULTURE   COMMENT                   NO GROWTH IN 18-24 HOURS   ANTIBIOTIC                       Culture Comment NO GROWTH IN 18-24 HOURS  Result(s) reported on 23 Jun 2014 at 11:00PM.    23:50   Micro Text Report BLOOD CULTURE   COMMENT                   NO GROWTH IN 18-24 HOURS   ANTIBIOTIC                       Culture Comment NO GROWTH IN 18-24 HOURS  Result(s) reported on 23 Jun 2014 at 11:00PM.  General Ref:  19-Jan-16 04:38   IgG, Subclass 4 ========== TEST NAME ==========  ========= RESULTS =========  = REFERENCE RANGE =  IGG SUBCLASS 4  IgG, Subclass 4 IgG, Subclass 4                 [   24 mg/dL             ]             [address]               [facility]            No: [id number]           [address], [place], [place] [address]           Lindon Romp, MD         [phone]   Result(s) reported on 21 Jun 2014 at 08:18AM.  21-Jan-16 23:45   C-Reactive Protein ========== TEST NAME ==========  ========= RESULTS =========  = REFERENCE RANGE =  C-REACTIVE PROTEIN,QUANT  C-Reactive Protein, Quant C-Reactive  Protein, Quant       [H  5.3 mg/L             ]           0.0-4.9               [facility]            No: [id number]           [address], [place], [place] [address]           Lindon Romp, MD         [phone]   Result(s) reported on 24 Jun 2014 at 05:46AM.  Routine Chem:  17-Jan-16 11:16   Lipase  1030 (Result(s) reported on 18 Jun 2014 at 11:43AM.)  18-Jan-16 04:05   Lipase  595 (Result(s) reported on 19 Jun 2014 at 04:47AM.)  19-Jan-16 04:38   Lipase  625 (Result(s) reported on 20 Jun 2014 at 05:39AM.)  20-Jan-16 04:23   Lipase  648 (Result(s) reported on 21 Jun 2014 at 05:04AM.)  23-Jan-16 04:38   Lipase  780 (Result(s) reported on 24 Jun 2014 at 05:40AM.)  Routine Hem:  17-Jan-16 11:16   WBC (CBC) 7.9  21-Jan-16 23:45   WBC (CBC) 8.4  Erythrocyte Sed Rate  48 (Result(s) reported on 23 Jun 2014 at 04:05AM.)  22-Jan-16 12:01   WBC (CBC) 4.7 (Result(s) reported on 23 Jun 2014 at 12:40PM.)   Radiology Results: [place]:    17-Jan-16 16:24, US Abdomen Limited Survey  US Abdomen Limited Survey   REASON FOR EXAM:    Upper abd pain  COMMENTS:   May transport without cardiac monitor    PROCEDURE: [place]  - US ABDOMEN LIMITED SURVEY  - Jun 18 2014  4:24PM     CLINICAL DATA:  Upper abdominal pain.    EXAM:  US ABDOMEN LIMITED - RIGHT UPPER QUADRANT    COMPARISON:  06/11/2014 CT    FINDINGS:  Gallbladder:  No gallstones or wall thickening visualized. No sonographic Murphy  sign noted.    Common bile duct:    Diameter: Normal caliber, 5 mm    Liver:    No focal lesion identified. Within normal limits in parenchymal  echogenicity.     IMPRESSION:  Unremarkable right upper quadrant ultrasound.  Electronically Signed    By: Rolm Baptise M.D.    On: 06/18/2014 16:41         Verified By: Raelyn Number, M.D.,  MRI:    21-Jan-16 08:58, MRI Abdomen WWO  MRI Abdomen WWO   REASON FOR EXAM:    Acute pancreatitis.  Ischemic colitis.  Please    evaluate.  COMMENTS:       PROCEDURE: MR  - MR ABDOMEN WO/W CONTRAST  - Jun 22 2014  8:58AM     CLINICAL DATA:  Left upper quadrant abdominal pain for 4 days.  History of transversecolitis.    EXAM:  MRI ABDOMEN WITHOUT AND WITH CONTRAST    TECHNIQUE:  Multiplanar multisequence MR imaging of the abdomen was performed  both before and after the administration of intravenous contrast.  CONTRAST:  10 cc MultiHance    COMPARISON:  CT scans 1/5 and 06/11/2016    FINDINGS:  Lower chest: Bibasilar atelectasis noted along with small pleural  effusions. The heart is normal in size. No pericardial effusion.  There is some fluid and inflammation surrounding the lower thoracic  aorta and esophagus.    Hepatobiliary: No hepatic lesions or intrahepatic biliary  dilatation. Normal caliber and course of the common bile duct. The  gallbladder is unremarkable.    Pancreas: Normal caliber and course of the main pancreatic duct. No  findings for acute pancreatitis or pancreatic lesion.    Spleen: The spleen is normal in size.  No focal lesions.    Adrenals/Urinary Tract: The adrenal glands and kidneys are  unremarkable. There is a prominent extra renal pelvis on the left  side.    Stomach/Bowel: The stomach, duodenum, visualized small bowel and  visualized colon are grossly normal. I do not see any obvious  persistent findings of transverse colitis.    Vascular/Lymphatic:  There is fluid and enlarged lymph nodes in the  gastrohepatic ligament, celiac axis and retroperitoneum. This is  likely the sequela of the patient's recent transverse colitis. I do  not see any evidence of acute pancreatitis.    The aorta and branch vessels appear normal. The major venous  structures are patent.    Other:    Musculoskeletal: No significant bony findings.     IMPRESSION:  1. Fluid/inflammation and adenopathy in the gastrohepatic ligament,  celiac axis and retroperitoneum. This is most likely  the sequela of  the patient's recent transverse colitis. I do not see any findings  to suggest acute pancreatitis. A followup abdominal CT scan with  contrast and 6 to 8 weeks may be helpful to reassess.  2. No definite MR findings for persistent transverse colitis.  3. Small bilateral pleural effusions and bibasilar atelectasis.      Electronically Signed    By: Kalman Jewels M.D.    On: 06/22/2014 09:31         Verified By: Marlane Hatcher, M.D.,   Assessment/Plan:  Assessment/Plan:  Assessment 1) acute/ recurrent pancreatitis-UDE.  Stable though some increase sx today.   2) recent episode of ischemic colitis. Stable.  no major vasculr d/o noted on mesenteric angio.  No bm since admission.   Plan 1) clears only 2) 2 way abd film in am.   following.   Electronic Signatures: Loistine Simas (MD)  (Signed 23-Jan-16 13:27)  Authored: Chief Complaint, VITAL SIGNS/ANCILLARY NOTES, Brief Assessment, Lab Results, Radiology Results, Assessment/Plan   Last Updated: 23-Jan-16 13:27 by Loistine Simas (MD)

## 2014-10-10 NOTE — H&P (Signed)
L&D Evaluation:  History Expanded:   HPI 34 yo G2 P1001 with EDD of 02/20/12, presents with c/o regular contractions. No LOF or VB. PNC at [facility] notable for early entry to care. OB Hx: SVD x 1 in 2010.    Blood Type (Maternal) A positive    Group B Strep Results Maternal (Result >5wks must be treated as unknown) negative    Maternal HIV Negative    Maternal Varicella Immune    Rubella Results (Maternal) immune    Maternal T-Dap Immune    Patient's Medical History No Chronic Illness    Patient's Surgical History Arm surgery, knee surgery, wisdom teeth    Medications Pre Natal Vitamins    Allergies Sulfa    Social History none   ROS:   ROS see HPI   Exam:   Vital Signs stable    General no apparent distress    Mental Status clear    Chest clear    Heart no murmur/gallop/rubs    Abdomen gravid, tender with contractions    Estimated Fetal Weight Average for gestational age    Edema no edema    Pelvic 4 cm per RN    Mebranes Intact    FHT normal rate with no decels    Ucx regular   Impression:   Impression active labor   Plan:   Plan monitor contractions and for cervical change    Comments Pt desires low-intervention delivery as much as possible.   Electronic Signatures: Ander Purpura (CNM)  (Signed 11-Sep-13 22:33)  Authored: L&D Evaluation   Last Updated: 11-Sep-13 22:33 by Ander Purpura (CNM)

## 2014-12-15 LAB — OB RESULTS CONSOLE GC/CHLAMYDIA
Chlamydia: NEGATIVE
GC PROBE AMP, GENITAL: NEGATIVE

## 2014-12-15 LAB — OB RESULTS CONSOLE ABO/RH: RH Type: POSITIVE

## 2014-12-15 LAB — OB RESULTS CONSOLE HEPATITIS B SURFACE ANTIGEN: HEP B S AG: NEGATIVE

## 2014-12-15 LAB — OB RESULTS CONSOLE RPR: RPR: NONREACTIVE

## 2014-12-15 LAB — OB RESULTS CONSOLE RUBELLA ANTIBODY, IGM: Rubella: IMMUNE

## 2014-12-15 LAB — OB RESULTS CONSOLE VARICELLA ZOSTER ANTIBODY, IGG: VARICELLA IGG: IMMUNE

## 2014-12-15 LAB — OB RESULTS CONSOLE HIV ANTIBODY (ROUTINE TESTING): HIV: NONREACTIVE

## 2014-12-15 LAB — OB RESULTS CONSOLE ANTIBODY SCREEN: Antibody Screen: NEGATIVE

## 2014-12-25 ENCOUNTER — Ambulatory Visit (INDEPENDENT_AMBULATORY_CARE_PROVIDER_SITE_OTHER): Payer: BLUE CROSS/BLUE SHIELD | Admitting: Podiatry

## 2014-12-25 ENCOUNTER — Encounter: Payer: Self-pay | Admitting: Podiatry

## 2014-12-25 VITALS — BP 100/60 | HR 57 | Resp 16 | Ht 62.0 in | Wt 127.0 lb

## 2014-12-25 DIAGNOSIS — M2012 Hallux valgus (acquired), left foot: Secondary | ICD-10-CM

## 2014-12-25 DIAGNOSIS — M722 Plantar fascial fibromatosis: Secondary | ICD-10-CM | POA: Diagnosis not present

## 2014-12-25 NOTE — Progress Notes (Signed)
   Subjective:    Patient ID: Ellen Blankenship, female    DOB: 02-13-1981, 34 y.o.   MRN: [id number]  HPI i have a bunion on my left foot that is becoming painful. i want to see if there is a way to reverse it without surgery. I am in my first trimester of pregnancy.   Review of Systems  Constitutional: Positive for appetite change and fatigue.       Objective:   Physical Exam: She presents today with a chief complaint of increasing pain and numbness along the dorsal medial aspect of the first metatarsophalangeal joint of the left foot. She states that I believe I have a bunion. She states the left foot seems to be worse in the right and is more more comfortable as time goes by. She states that she is in her first trimester of her third pregnancy.    I have reviewed her past medical history medications allergies surgery social history. Pulses are strongly palpable bilateral. Neurologic sensorium is intact for since was the monofilament. Deep tendon reflexes are intact bilateral and muscle strength was 5 over 5 dorsiflexion plantar flexors and inverters everters all intrinsic musculature is intact. Orthopedic evaluation demonstrates all joints distal to the ankle range of motion without crepitation. Cutaneous evaluation demonstrates soft supple cutis without lesions. Orthopedic evaluation does also demonstrate an increase in the first intermetatarsal angle and the fourth intermetatarsal angle of the left foot. Full range of motion of the toes at the metatarsophalangeal joints however very prominent fifth metatarsal and first metatarsal heads. Radiographs can obviously not be performed today due to her pregnancy. She also has a history of plantar fasciitis which is currently asymptomatic upon palpation.        Assessment & Plan:  Assessment: History of plantar fasciitis with hallux abductovalgus deformity left greater than right. Tailor's bunion deformity left greater than right.  Plan:  Discussed etiology pathology conservative versus surgical therapies. At this point orthotics were scanned today to help control her plantar fasciitis as well as the pronation to her left foot which may result in worsening of her bunion deformity.

## 2015-04-25 LAB — OB RESULTS CONSOLE RPR: RPR: NONREACTIVE

## 2015-04-25 LAB — OB RESULTS CONSOLE HIV ANTIBODY (ROUTINE TESTING): HIV: NONREACTIVE

## 2015-06-03 NOTE — L&D Delivery Note (Signed)
Delivery Note  EGA: 40.1  At 3:44 AM a viable female was delivered via Vaginal, Spontaneous Delivery (Presentation: Left Occiput Posterior).  APGAR: 8, 9; weight pending .   Placenta status: Intact, Spontaneous.  Cord: 3 vessels with the following complications: None.    Anesthesia: Epidural  Episiotomy: None Lacerations: 2nd degree Suture Repair: 3.0 vicryl Est. Blood Loss (mL): 300  Mom to postpartum.  Baby to Couplet care / Skin to Skin.  Karyle was admitted for elective IOL at 40.0 weeks and was given cytotec x1 then AROM'd for clear fluid.  She developed acute onset visual fields defect and both CT and MRI/MRA of the head were performed and negative.  Pituitary was enlarged and sitting on optic nerve.  Decision was made to keep her in-house and continue with IOL.  Epidural placed and pitocin started.  Patient progressed to complete and 2nd stage was 30 minutes.  Baby delivered OP with loose nuchal x1 and placed on mom's chest. Delay of cord clamping x >60sec.  Placenta delivered spontaneously, intact. 2nd degree repaired with 3-0 vicryl in standard fashion.   We sang happy birthday to baby [place].  Averleigh Savary C 08/03/2015, 4:35 AM

## 2015-07-01 ENCOUNTER — Encounter: Payer: Self-pay | Admitting: *Deleted

## 2015-07-01 ENCOUNTER — Observation Stay
Admission: EM | Admit: 2015-07-01 | Discharge: 2015-07-02 | Disposition: A | Discharge status: Home / Self Care | Payer: BLUE CROSS/BLUE SHIELD | Attending: Obstetrics and Gynecology | Admitting: Obstetrics and Gynecology

## 2015-07-01 DIAGNOSIS — Z3A35 35 weeks gestation of pregnancy: Secondary | ICD-10-CM | POA: Diagnosis not present

## 2015-07-01 DIAGNOSIS — O99613 Diseases of the digestive system complicating pregnancy, third trimester: Secondary | ICD-10-CM | POA: Insufficient documentation

## 2015-07-01 DIAGNOSIS — R1031 Right lower quadrant pain: Secondary | ICD-10-CM | POA: Diagnosis present

## 2015-07-01 DIAGNOSIS — K559 Vascular disorder of intestine, unspecified: Secondary | ICD-10-CM | POA: Diagnosis not present

## 2015-07-01 DIAGNOSIS — Z882 Allergy status to sulfonamides status: Secondary | ICD-10-CM | POA: Diagnosis not present

## 2015-07-01 HISTORY — DX: Spondylolysis, lumbar region: M43.06

## 2015-07-01 HISTORY — DX: Acute pancreatitis without necrosis or infection, unspecified: K85.90

## 2015-07-01 HISTORY — DX: Vascular disorder of intestine, unspecified: K55.9

## 2015-07-01 LAB — URINALYSIS COMPLETE WITH MICROSCOPIC (ARMC ONLY)
Bilirubin Urine: NEGATIVE
GLUCOSE, UA: NEGATIVE mg/dL
HGB URINE DIPSTICK: NEGATIVE
Ketones, ur: NEGATIVE mg/dL
NITRITE: NEGATIVE
PROTEIN: NEGATIVE mg/dL
Specific Gravity, Urine: 1.011 (ref 1.005–1.030)
pH: 7 (ref 5.0–8.0)

## 2015-07-01 LAB — COMPREHENSIVE METABOLIC PANEL
ALT: 6 U/L — AB (ref 14–54)
AST: 21 U/L (ref 15–41)
Albumin: 3 g/dL — ABNORMAL LOW (ref 3.5–5.0)
Alkaline Phosphatase: 57 U/L (ref 38–126)
Anion gap: 8 (ref 5–15)
BUN: 12 mg/dL (ref 6–20)
CHLORIDE: 106 mmol/L (ref 101–111)
CO2: 21 mmol/L — AB (ref 22–32)
CREATININE: 0.62 mg/dL (ref 0.44–1.00)
Calcium: 8.5 mg/dL — ABNORMAL LOW (ref 8.9–10.3)
GFR calc non Af Amer: >60 mL/min (ref >60)
Glucose, Bld: 108 mg/dL — ABNORMAL HIGH (ref 65–99)
POTASSIUM: 3.6 mmol/L (ref 3.5–5.1)
SODIUM: 135 mmol/L (ref 135–145)
Total Bilirubin: 0.8 mg/dL (ref 0.3–1.2)
Total Protein: 6.5 g/dL (ref 6.5–8.1)

## 2015-07-01 LAB — CBC
HCT: 33.9 % — ABNORMAL LOW (ref 35.0–47.0)
Hemoglobin: 11.3 g/dL — ABNORMAL LOW (ref 12.0–16.0)
MCH: 29.9 pg (ref 26.0–34.0)
MCHC: 33.4 g/dL (ref 32.0–36.0)
MCV: 89.5 fL (ref 80.0–100.0)
PLATELETS: 208 10*3/uL (ref 150–440)
RBC: 3.79 MIL/uL — AB (ref 3.80–5.20)
RDW: 13.3 % (ref 11.5–14.5)
WBC: 15.7 10*3/uL — AB (ref 3.6–11.0)

## 2015-07-01 LAB — LACTIC ACID, PLASMA: Lactic Acid, Venous: 1.7 mmol/L (ref 0.5–2.0)

## 2015-07-01 LAB — OB RESULTS CONSOLE GBS: GBS: NEGATIVE

## 2015-07-01 MED ORDER — LACTATED RINGERS IV BOLUS (SEPSIS)
1000.0000 mL | Freq: Once | INTRAVENOUS | Status: AC
  Administered 2015-07-01: 1000 mL via INTRAVENOUS

## 2015-07-01 NOTE — OB Triage Note (Signed)
Severe Abd pains started tonight around 7:30 p.m., "stabbing" pain, also lower back pain.

## 2015-07-01 NOTE — H&P (Addendum)
OB History & Physical   History of Present Illness:  Chief Complaint: abdominal pain  HPI:  Ellen Blankenship is a 35 y.o. G78P2002 female at [redacted]w[redacted]d dated by LMP.  Her pregnancy has been a history of ischemic colitis (did not require surgery).  She has been referred to [facility] GI surgery for evaluation at about 29 weeks. Conservative management was recommended.  She reports contractions.   She denies leakage of fluid.   She denies vaginal bleeding.   She reports fetal movement.   She began having pain severe mid abdominal pain that eventually localized to her right lower quadrant at around 730pm this evening. The pain was initially constant and felt like a tightening of her uterus. She did not have abdominal trauma. She had no inciting event.  She denies vomiting, though she did have nausea after the pain started. She as not had fevers or chills.  She denies GI issues, especially hematochezia and melena.  She denies bladder symptoms and vaginal symptoms.  Maternal Medical History:   Past Medical History  Diagnosis Date  . Ischemic colitis ([place])   . Pancreatitis   . Pars defect of lumbar spine     Past Surgical History  Procedure Laterality Date  . Elbow surgery    . Arthroscopic repair acl Left   . Wisdom tooth extraction      Allergies  Allergen Reactions  . Sulfa Antibiotics     Other reaction(s): Other (See Comments) Blisters in eye with sulfa eye drops    Prior to Admission medications   Medication Sig Start Date End Date Taking? Authorizing Provider  Promethazine HCl (PHENERGAN PO) Take by mouth.   Yes Historical Provider, MD  DICLEGIS 10-10 MG TBEC Reported on 07/01/2015 12/13/14   Historical Provider, MD    OB History  Gravida Para Term Preterm AB SAB TAB Ectopic Multiple Living  3 2 2       2     # Outcome Date GA Lbr Len/2nd Weight Sex Delivery Anes PTL Lv  3 Current           2 Term 02/12/12    Lillia Corporal  1 Term 01/02/09    Lillia Corporal      Prenatal care site: [facility]  OB/GYN  Social History: She  reports that she has never smoked. She does not have any smokeless tobacco history on file. She reports that she does not drink alcohol or use illicit drugs.  Family History: family history is not on file.   Review of Systems: Negative x 10 systems reviewed except as noted in the HPI.    Physical Exam:  Vital Signs: BP 126/69 mmHg  Pulse 71  Temp(Src) 98.1 F (36.7 C) (Oral)  Resp 20 General: no acute distress.  HEENT: normocephalic, atraumatic Heart: regular rate & rhythm.  No murmurs/rubs/gallops Lungs: clear to auscultation bilaterally Abdomen: soft, gravid, non-tender;  EFW: 6 pounds Pelvic:   External: Normal external female genitalia  Cervix: Dilation: Closed / Effacement (%): 50 / Station: -3  Extremities: non-tender, symmetric, no edema bilaterally.    Neurologic: Alert & oriented x 3.    Pertinent Results:  Prenatal Labs: Blood type/Rh A positive  Antibody screen negative  Rubella Immune  Varicella Immune    RPR NR  HBsAg negative  HIV negative  GC negative  Chlamydia negative  Genetic screening Declined  1 hour GTT 147  3 hour GTT 78, 146, 115, 103 (all normal)  GBS  unknown   Baseline FHR: 145 beats/min   Variability: moderate   Accelerations: present   Decelerations: absent Contractions: present frequency: 3-4 q 10 Overall assessment: cat 1  Assessment:  Ellen Blankenship is a 35 y.o. G35P2002 female at [redacted]w[redacted]d with abdominal pain and contractions.  Abdominal pain likely due to contractions.  Will consider bowel issues with history of bowel ischemia. Will also investigate for cystitis, vaginitis.  Will get CBC to assess WBC count as I can't discount appendicitis.  Lactate to investigate ischemia.   Plan:  1. Admit to Labor & Delivery for observation 2. U/A, wet prep, GBS, CBC, CMP, lactate 3. IVF bolus, 1L LR over 2 hours, terbutaline x1. Will consider BMTZ if likely to deliver in next week. 4. GBS ordered 5. Fetwal well-being:  reassuring overall  Will Bonnet, MD 07/01/2015 11:01 PM    ADDENDUM: Contractions have spaced out and pain has significantly lessened correspondingly.  Lab results: Lab Results  Component Value Date   WBC 15.7* 07/01/2015   HGB 11.3* 07/01/2015   HCT 33.9* 07/01/2015   PLT 208 07/01/2015    Recent Labs     07/01/15  2301  K  3.6  NA  135  CREATININE  0.62  AST  21  ALT  6*    Lab Results  Component Value Date   TRICHWETPREP NONE SEEN 07/01/2015   CLUECELLS PRESENT* 07/01/2015   WBCWETPREP FEW* 07/01/2015   YEASTWETPREP NONE SEEN 07/01/2015   Lab Results  Component Value Date   APPEARANCEUR HAZY* 07/01/2015   GLUCOSEU NEGATIVE 07/01/2015   BILIRUBINUR NEGATIVE 07/01/2015   KETONESUR NEGATIVE 07/01/2015   LABSPEC 1.011 07/01/2015   HGBUR NEGATIVE 07/01/2015   PHURINE 7.0 07/01/2015   NITRITE NEGATIVE 07/01/2015   LEUKOCYTESUR 2+* 07/01/2015   RBCU 0-5 07/01/2015   WBCU 0-5 07/01/2015   BACTERIA RARE* 07/01/2015   EPIU 6-30* 07/01/2015   MUCOUSUACOMP PRESENT 06/06/2014   Patient discharged after feeling much better with IV hydration and terbutaline x 1 dose. No obvious etiology found apart from contractions.  Routine follow up with precautions.  Patient left prior to receiving rx for flagyl.   GBS pending at time of discharge.  Prentice Docker, MD 07/02/2015 8:02 AM

## 2015-07-01 NOTE — Progress Notes (Signed)
Orders received, IV started in Left forearm, LR IV bolus initiated, labs drawn, UA collected and sent (CBC, CMP, GY-ICE). GBS, Wet Prep performed by MD, sent to lab. Pt. Resting with spouse at the bedside. Fetal heart rate in 140s, pt. Contracting every 3-5 minutes, 100-110 sec. RN will continue to monitor.

## 2015-07-02 LAB — WET PREP, GENITAL
SPERM: NONE SEEN
TRICH WET PREP: NONE SEEN
YEAST WET PREP: NONE SEEN

## 2015-07-02 LAB — LIPASE, BLOOD: LIPASE: 30 U/L (ref 11–51)

## 2015-07-02 MED ORDER — TERBUTALINE SULFATE 1 MG/ML IJ SOLN
0.2500 mg | Freq: Once | INTRAMUSCULAR | Status: AC | PRN
  Administered 2015-07-02: 0.25 mg via SUBCUTANEOUS
  Filled 2015-07-02: qty 1

## 2015-07-02 NOTE — Progress Notes (Signed)
Called MD, discussed contraction pattern, pt. Asleep upon entering the room during the night, OK to discharge patient to home. Fetal tracing reactive, contracting on average every 6-7 min, contractions lasting 50-110 sec.  Pt. Sleeping through contractions. IV discontinued, removed with blue tip in tact.

## 2015-07-02 NOTE — Final Progress Note (Signed)
All information pertinent to this encounter are noted in the H&P.    Prentice Docker, MD 07/02/2015 8:04 AM

## 2015-07-02 NOTE — Progress Notes (Signed)
Discharge instructions reviewed with patient, patient verbalized understanding, signed copy and copy given. Pt. Instructed to call the office today to see if she needs a follow-up appt.

## 2015-07-04 LAB — CULTURE, BETA STREP (GROUP B ONLY)

## 2015-07-06 IMAGING — US US EXTREM LOW DUPLEX ARTERIAL*R* LIMITED
1 series · 14 of 25 positions shown · non-contrast
Comparison: 06/21/2014, 06/11/2014

CLINICAL DATA: Right groin pain, status post arteriogram 06/21/2014

EXAM:
UNILATERAL RIGHT LOWER EXTREMITY ARTERIAL DUPLEX SCAN
TECHNIQUE: Gray-scale sonography as well as color Doppler and duplex ultrasound
was performed to evaluate the arteries of the lower extremity.

[Series 1: us extrem low duplex arterial*right* limited · 0.05mm/px · 14 of 41 slices shown]
[im 1/41]
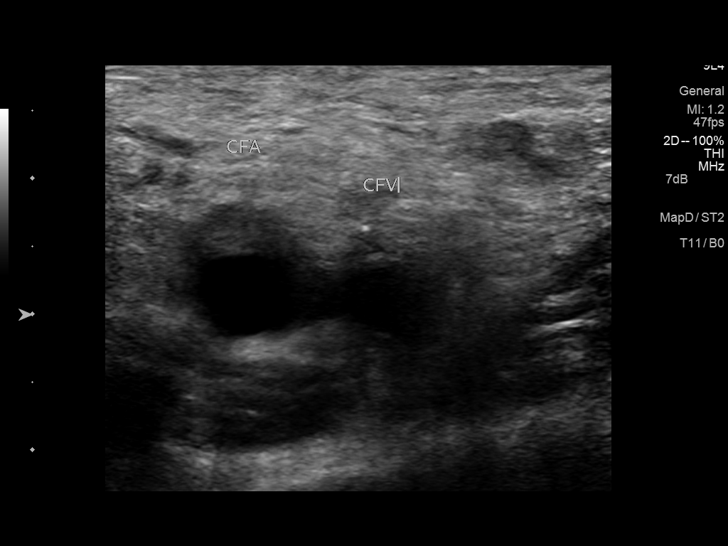
[im 4/41]
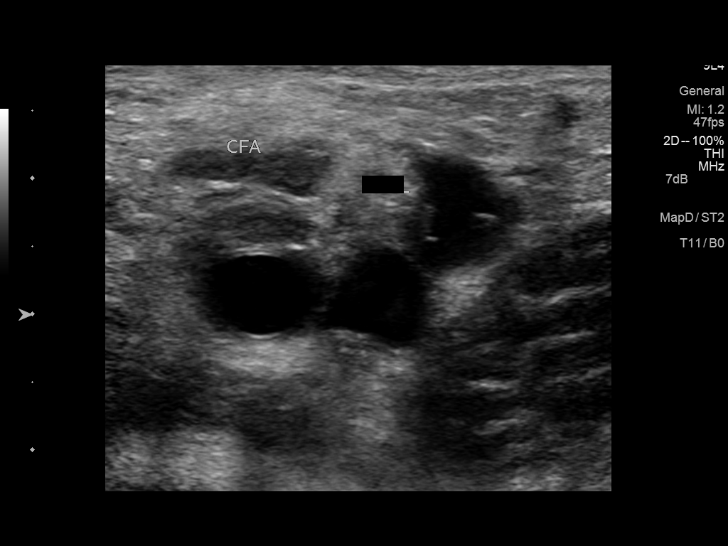
[im 7/41]
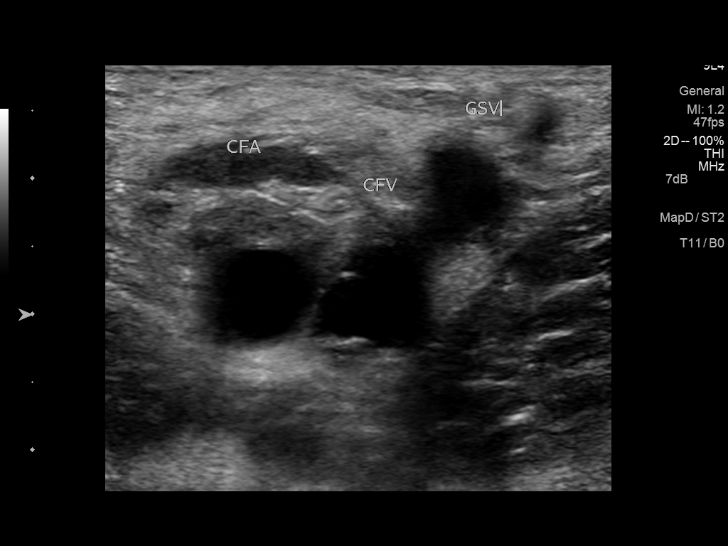
[im 11/41]
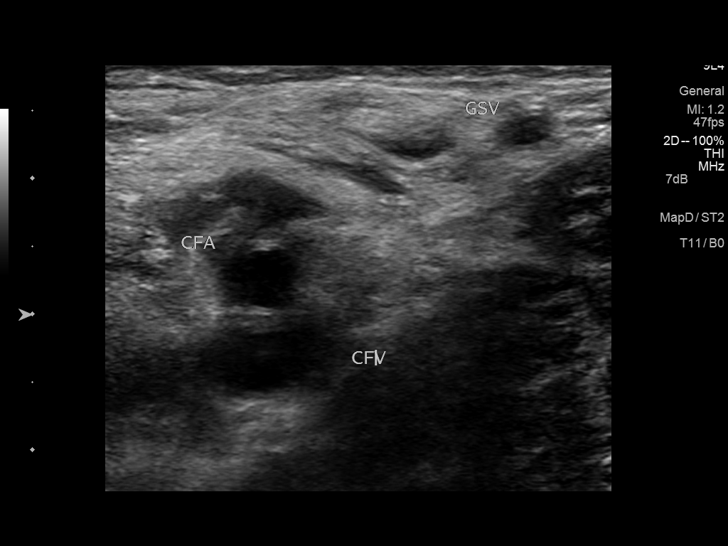
[im 14/41]
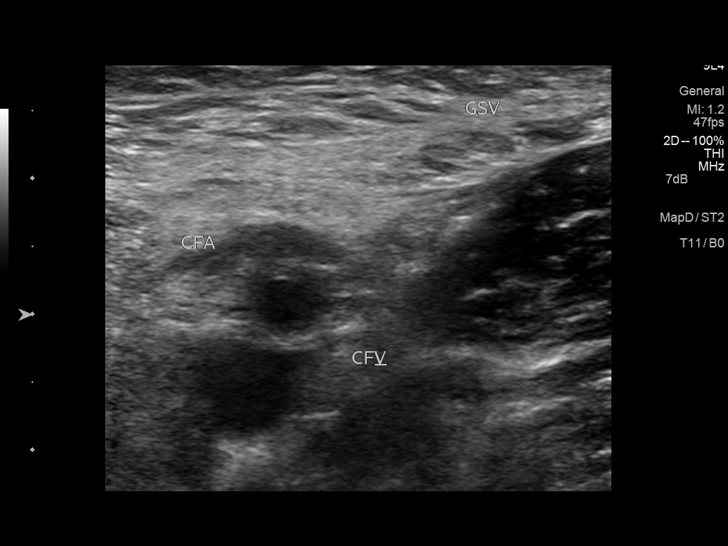
[im 16/41]
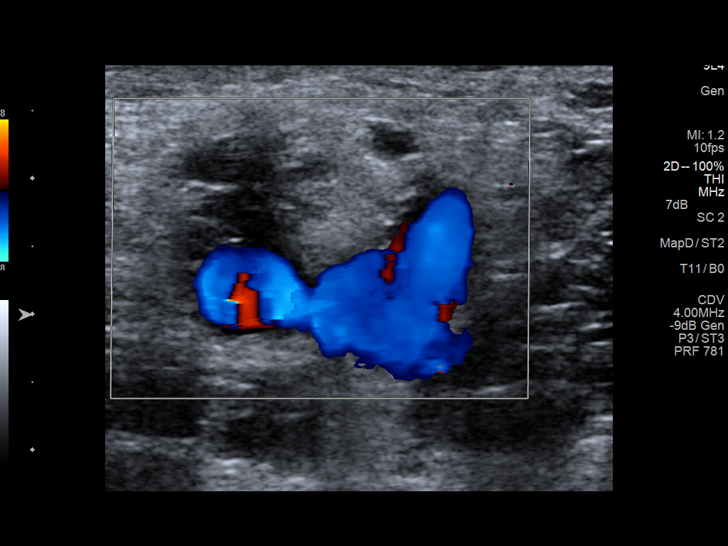
[im 19/41]
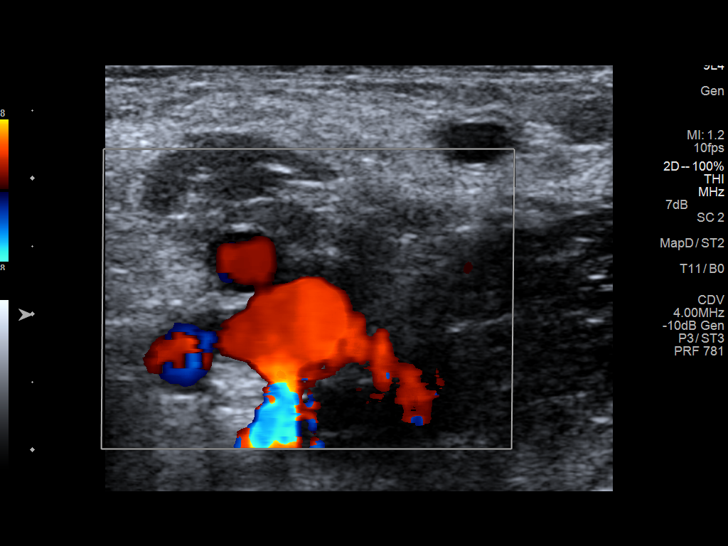
[im 22/41]
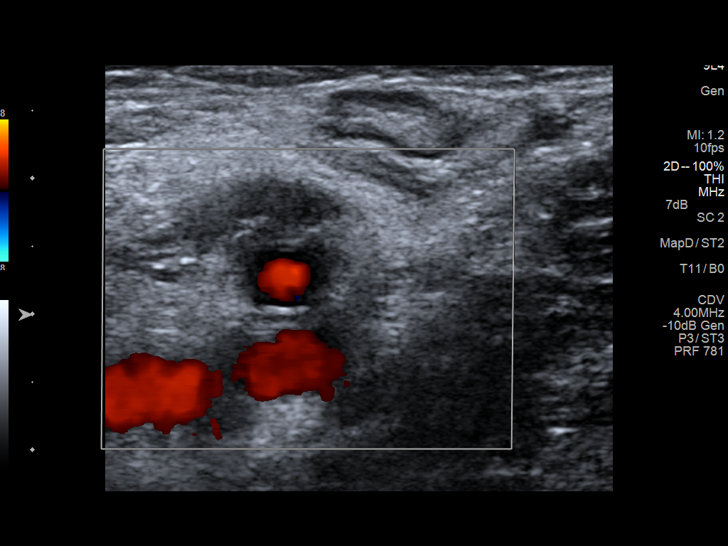
[im 26/41]
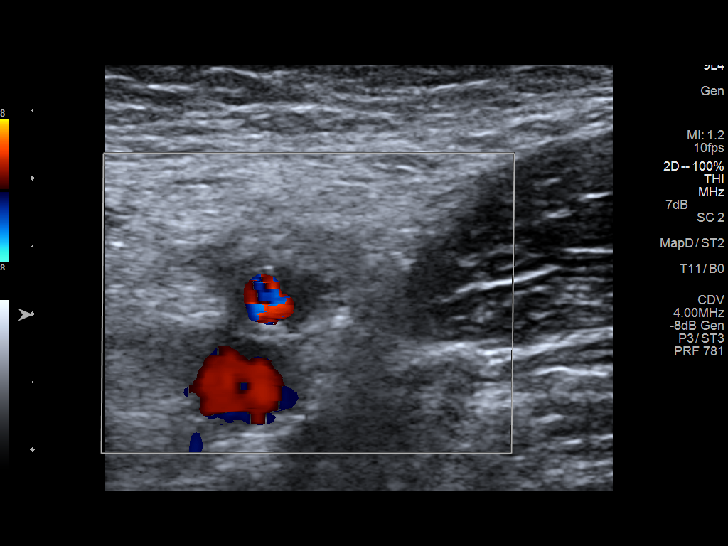
[im 27/41]
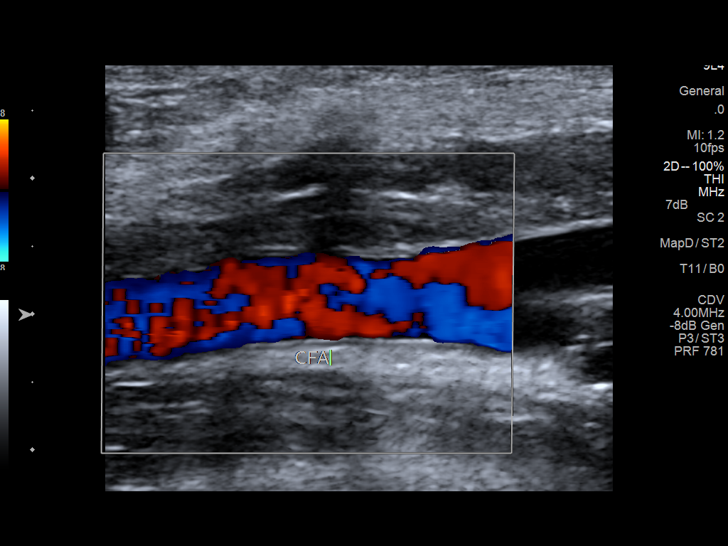
[im 31/41]
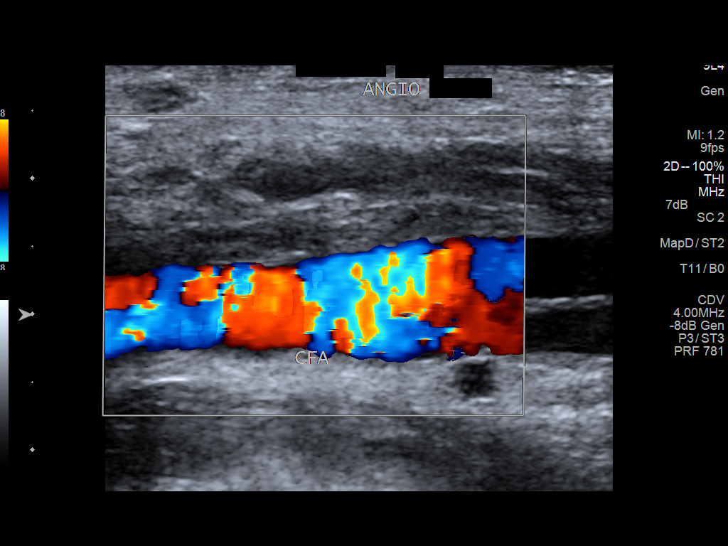
[im 34/41]
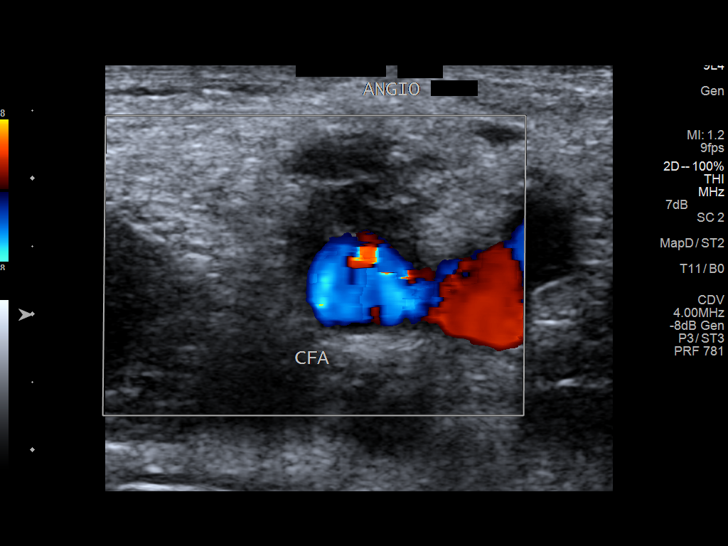
[im 37/41]
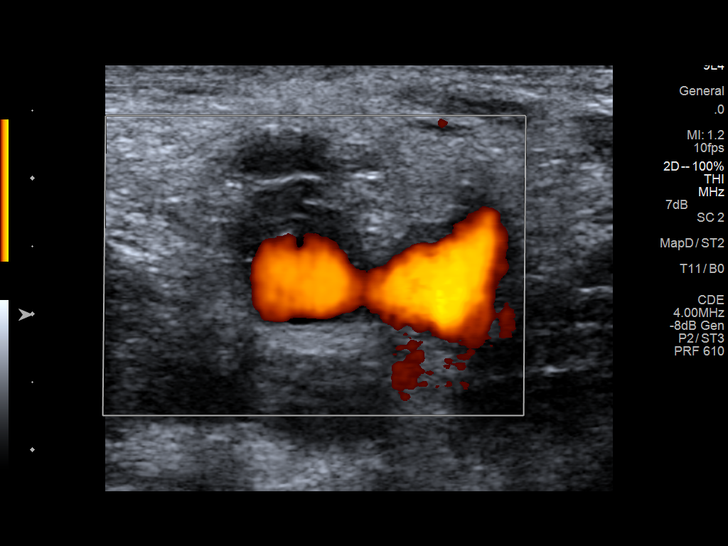
[im 41/41]
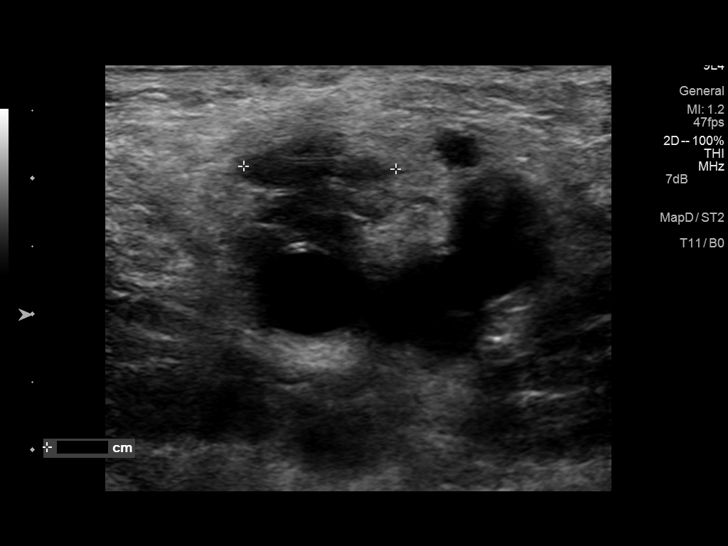

[14 of 25 positions shown; findings below may reference images not displayed]

FINDINGS: Ultrasound performed of the right groin area. This demonstrates
patency of the right common femoral, profunda femoral, and
superficial femoral arteries without evidence of pseudoaneurysm.
Elongated hypoechoic area anterior to the common femoral and
proximal femoral arteries noted measures 5.8 x 0.6 x 1.1 cm
compatible with a small hematoma.
IMPRESSION: Small inguinal hematoma overlies the right femoral arteries at the
access site. Negative for pseudoaneurysm or occlusion.

## 2015-07-06 IMAGING — US US EXTREM LOW VENOUS*R*
1 series · 13 of 24 positions shown · non-contrast
Comparison: None.

CLINICAL DATA: Acute right lower extremity pain, recent angiogram
06/21/2014



[Series 1: us extrem low venous*right* · 0.05mm/px · 13 of 30 slices shown]
[im 1/30]
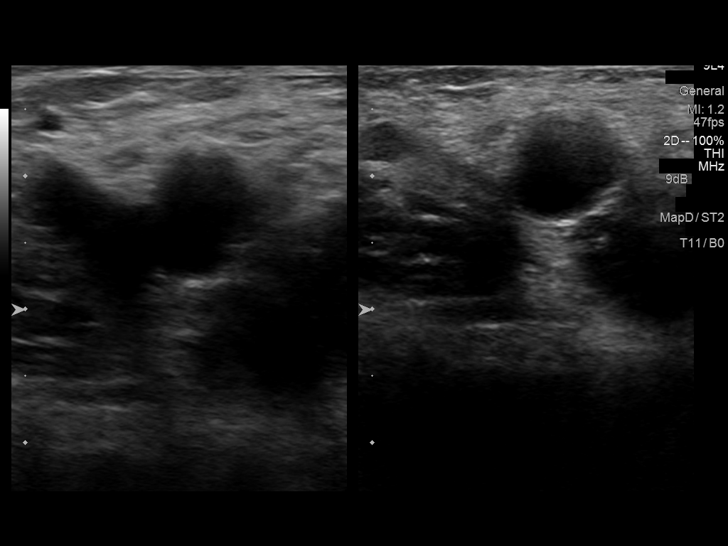
[im 3/30]
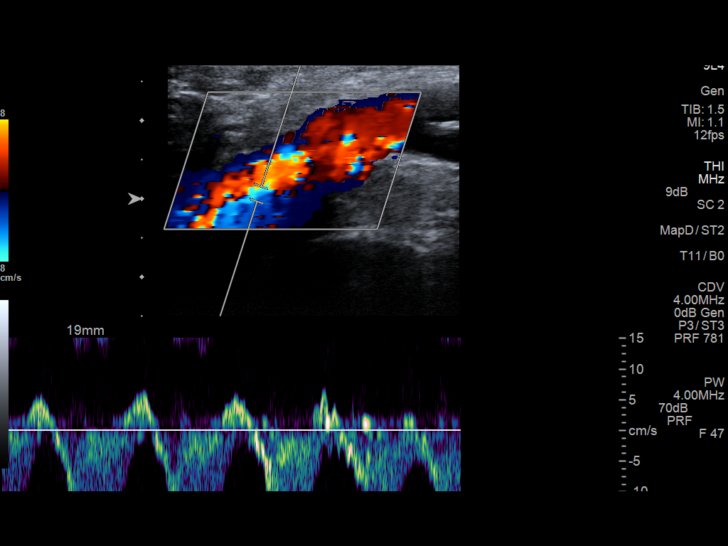
[im 6/30]
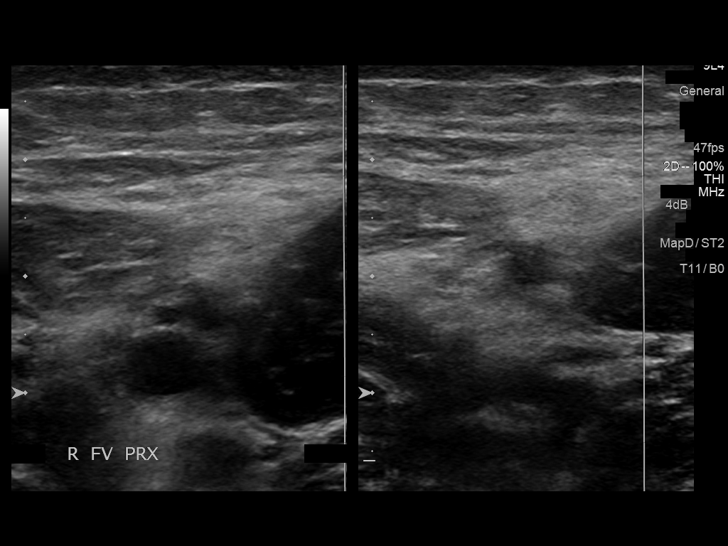
[im 8/30]
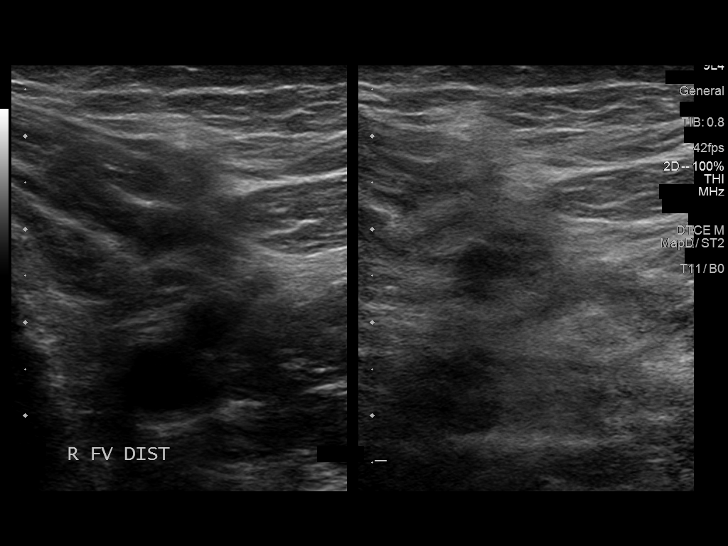
[im 11/30]
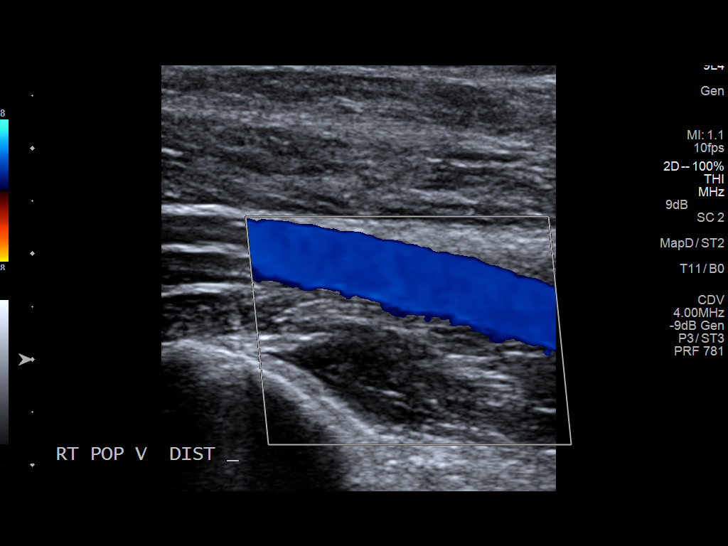
[im 13/30]
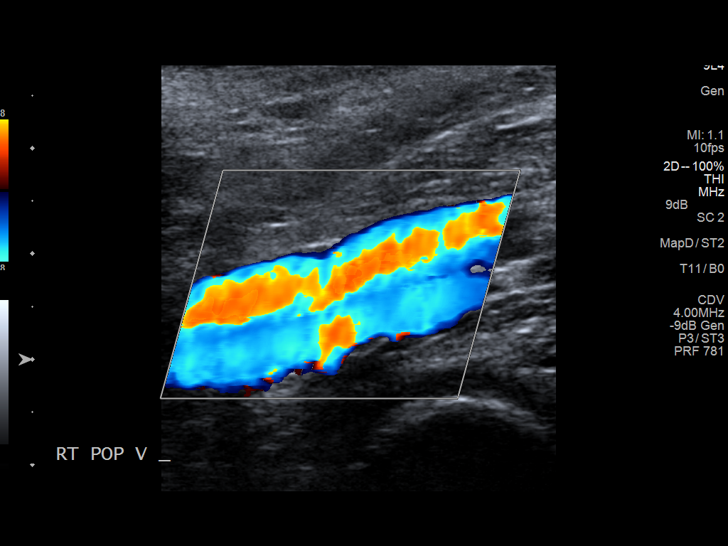
[im 16/30]
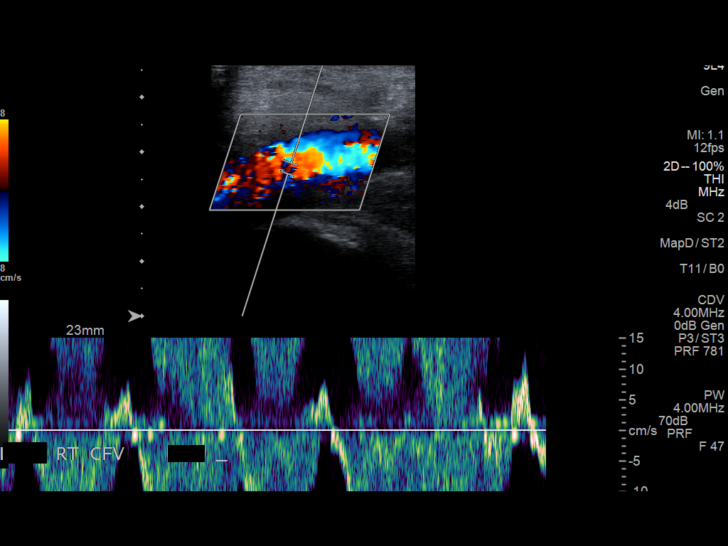
[im 17/30]
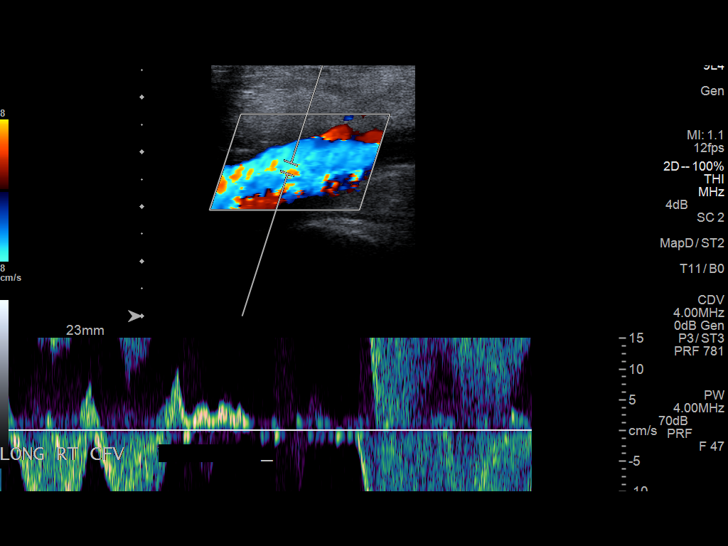
[im 19/30]
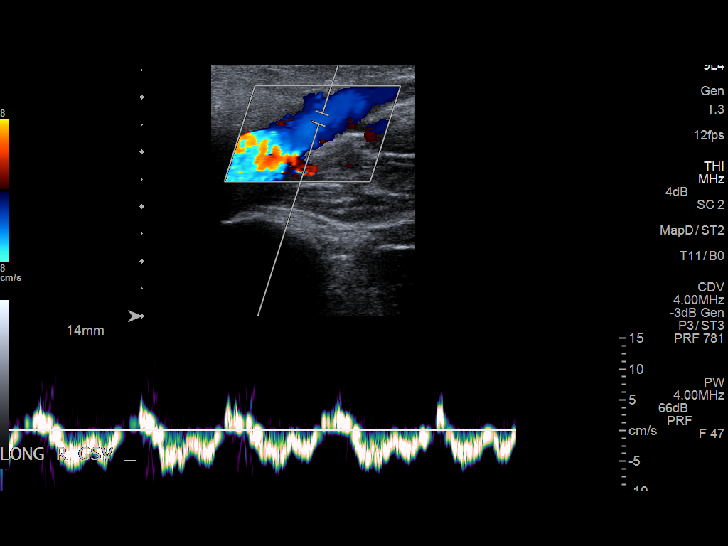
[im 22/30]
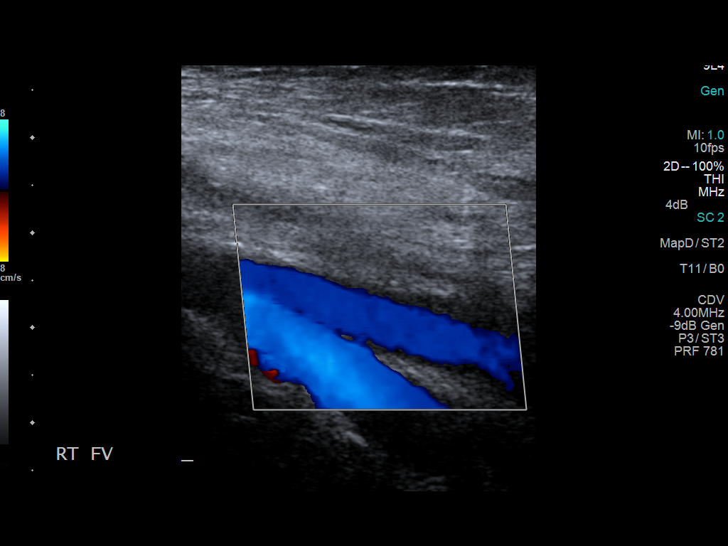
[im 24/30]
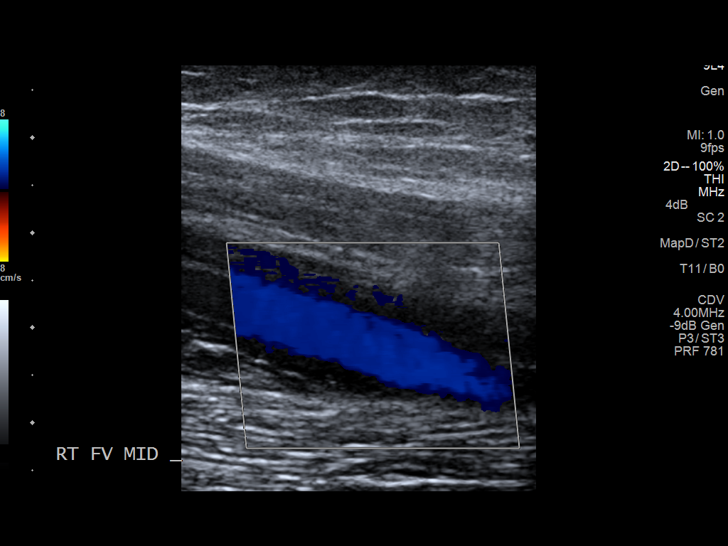
[im 27/30]
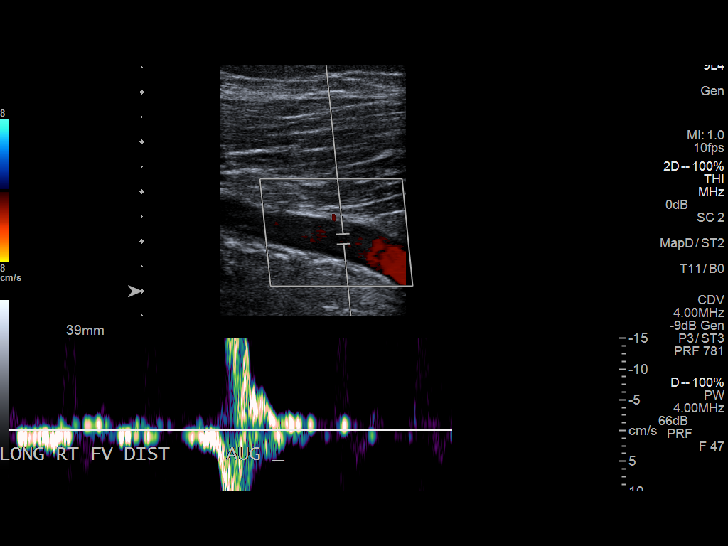
[im 30/30]
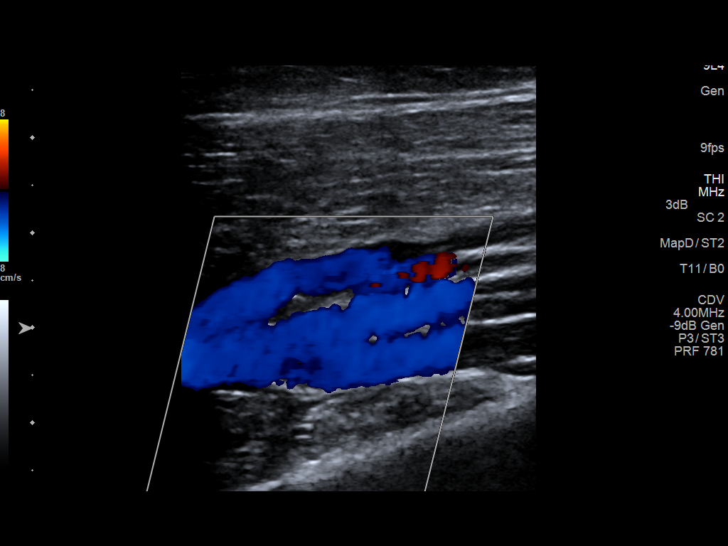

[13 of 24 positions shown; findings below may reference images not displayed]

FINDINGS: Contralateral Common Femoral Vein: Respiratory phasicity is normal
and symmetric with the symptomatic side. No evidence of thrombus.
Normal compressibility.

Common Femoral Vein: No evidence of thrombus. Normal
compressibility, respiratory phasicity and response to augmentation.

Saphenofemoral Junction: No evidence of thrombus. Normal
compressibility and flow on color Doppler imaging.

Profunda Femoral Vein: No evidence of thrombus. Normal
compressibility and flow on color Doppler imaging.

Femoral Vein: No evidence of thrombus. Normal compressibility,
respiratory phasicity and response to augmentation.

Popliteal Vein: No evidence of thrombus. Normal compressibility,
respiratory phasicity and response to augmentation.

Calf Veins: No evidence of thrombus. Normal compressibility and flow
on color Doppler imaging.

Superficial Great Saphenous Vein: No evidence of thrombus. Normal
compressibility and flow on color Doppler imaging.

Venous Reflux:  None.

Other Findings:  None.
IMPRESSION: No evidence of deep venous thrombosis.

## 2015-08-02 ENCOUNTER — Inpatient Hospital Stay: Payer: BLUE CROSS/BLUE SHIELD

## 2015-08-02 ENCOUNTER — Encounter: Payer: Self-pay | Admitting: *Deleted

## 2015-08-02 ENCOUNTER — Inpatient Hospital Stay: Payer: BLUE CROSS/BLUE SHIELD | Admitting: Anesthesiology

## 2015-08-02 ENCOUNTER — Inpatient Hospital Stay
Admission: EM | Admit: 2015-08-02 | Discharge: 2015-08-05 | DRG: 775 | Disposition: A | Discharge status: Home / Self Care | Payer: BLUE CROSS/BLUE SHIELD | Attending: Obstetrics & Gynecology | Admitting: Obstetrics & Gynecology

## 2015-08-02 DIAGNOSIS — Z8719 Personal history of other diseases of the digestive system: Secondary | ICD-10-CM

## 2015-08-02 DIAGNOSIS — R609 Edema, unspecified: Secondary | ICD-10-CM

## 2015-08-02 DIAGNOSIS — H539 Unspecified visual disturbance: Secondary | ICD-10-CM

## 2015-08-02 DIAGNOSIS — H547 Unspecified visual loss: Secondary | ICD-10-CM | POA: Diagnosis not present

## 2015-08-02 DIAGNOSIS — H5340 Unspecified visual field defects: Secondary | ICD-10-CM | POA: Diagnosis not present

## 2015-08-02 DIAGNOSIS — M4306 Spondylolysis, lumbar region: Secondary | ICD-10-CM | POA: Diagnosis present

## 2015-08-02 DIAGNOSIS — H53461 Homonymous bilateral field defects, right side: Secondary | ICD-10-CM

## 2015-08-02 DIAGNOSIS — Z3483 Encounter for supervision of other normal pregnancy, third trimester: Secondary | ICD-10-CM | POA: Diagnosis present

## 2015-08-02 DIAGNOSIS — M533 Sacrococcygeal disorders, not elsewhere classified: Secondary | ICD-10-CM | POA: Diagnosis not present

## 2015-08-02 DIAGNOSIS — E236 Other disorders of pituitary gland: Secondary | ICD-10-CM | POA: Diagnosis present

## 2015-08-02 DIAGNOSIS — Z3A40 40 weeks gestation of pregnancy: Secondary | ICD-10-CM | POA: Diagnosis not present

## 2015-08-02 DIAGNOSIS — D352 Benign neoplasm of pituitary gland: Secondary | ICD-10-CM | POA: Diagnosis not present

## 2015-08-02 LAB — ABO/RH: ABO/RH(D): A POS

## 2015-08-02 LAB — TYPE AND SCREEN
ABO/RH(D): A POS
ANTIBODY SCREEN: NEGATIVE

## 2015-08-02 LAB — CBC
HEMATOCRIT: 33.7 % — AB (ref 35.0–47.0)
HEMOGLOBIN: 11.7 g/dL — AB (ref 12.0–16.0)
MCH: 29.9 pg (ref 26.0–34.0)
MCHC: 34.7 g/dL (ref 32.0–36.0)
MCV: 86 fL (ref 80.0–100.0)
Platelets: 215 10*3/uL (ref 150–440)
RBC: 3.92 MIL/uL (ref 3.80–5.20)
RDW: 13.7 % (ref 11.5–14.5)
WBC: 10.5 10*3/uL (ref 3.6–11.0)

## 2015-08-02 MED ORDER — AMMONIA AROMATIC IN INHA
RESPIRATORY_TRACT | Status: AC
  Filled 2015-08-02: qty 10

## 2015-08-02 MED ORDER — TERBUTALINE SULFATE 1 MG/ML IJ SOLN
0.2500 mg | Freq: Once | INTRAMUSCULAR | Status: AC
  Administered 2015-08-02: 0.25 mg via SUBCUTANEOUS

## 2015-08-02 MED ORDER — LACTATED RINGERS IV SOLN: INTRAVENOUS | Status: DC

## 2015-08-02 MED ORDER — EPHEDRINE SULFATE 50 MG/ML IJ SOLN
INTRAMUSCULAR | Status: AC
  Administered 2015-08-02: 5 mg via INTRAVENOUS
  Filled 2015-08-02: qty 1

## 2015-08-02 MED ORDER — SODIUM CHLORIDE 0.9 % IV SOLN: 250.0000 mL | INTRAVENOUS | Status: DC | PRN

## 2015-08-02 MED ORDER — ACETAMINOPHEN 500 MG PO TABS: 1000.0000 mg | ORAL_TABLET | Freq: Four times a day (QID) | ORAL | Status: DC | PRN

## 2015-08-02 MED ORDER — TERBUTALINE SULFATE 1 MG/ML IJ SOLN: 0.2500 mg | Freq: Once | INTRAMUSCULAR | Status: DC | PRN

## 2015-08-02 MED ORDER — SODIUM CHLORIDE 0.9% FLUSH: 3.0000 mL | Freq: Two times a day (BID) | INTRAVENOUS | Status: DC

## 2015-08-02 MED ORDER — FENTANYL CITRATE (PF) 100 MCG/2ML IJ SOLN
25.0000 ug | Freq: Once | INTRAMUSCULAR | Status: AC
  Administered 2015-08-02: 25 ug via INTRAVENOUS

## 2015-08-02 MED ORDER — FENTANYL CITRATE (PF) 100 MCG/2ML IJ SOLN
50.0000 ug | Freq: Once | INTRAMUSCULAR | Status: AC
  Administered 2015-08-02: 50 ug via INTRAVENOUS

## 2015-08-02 MED ORDER — MISOPROSTOL 25 MCG QUARTER TABLET
ORAL_TABLET | ORAL | Status: AC
  Administered 2015-08-02: 25 ug via ORAL
  Filled 2015-08-02: qty 0.25

## 2015-08-02 MED ORDER — SODIUM CHLORIDE FLUSH 0.9 % IV SOLN
INTRAVENOUS | Status: AC
  Filled 2015-08-02: qty 30

## 2015-08-02 MED ORDER — SODIUM CHLORIDE FLUSH 0.9 % IV SOLN
INTRAVENOUS | Status: AC
  Filled 2015-08-02: qty 20

## 2015-08-02 MED ORDER — LIDOCAINE HCL (PF) 1 % IJ SOLN
INTRAMUSCULAR | Status: AC
  Filled 2015-08-02: qty 30

## 2015-08-02 MED ORDER — FENTANYL 2.5 MCG/ML W/ROPIVACAINE 0.2% IN NS 100 ML EPIDURAL INFUSION (ARMC-ANES)
EPIDURAL | Status: AC
  Administered 2015-08-02: 10 mL/h via EPIDURAL
  Filled 2015-08-02: qty 100

## 2015-08-02 MED ORDER — OXYTOCIN 10 UNIT/ML IJ SOLN
INTRAMUSCULAR | Status: AC
  Filled 2015-08-02: qty 2

## 2015-08-02 MED ORDER — SODIUM CHLORIDE 0.9% FLUSH
3.0000 mL | INTRAVENOUS | Status: DC | PRN
  Administered 2015-08-02: 10 mL via INTRAVENOUS
  Filled 2015-08-02: qty 3

## 2015-08-02 MED ORDER — OXYTOCIN 40 UNITS IN LACTATED RINGERS INFUSION - SIMPLE MED
1.0000 m[IU]/min | INTRAVENOUS | Status: DC
  Administered 2015-08-03: 0.667 m[IU]/min via INTRAVENOUS

## 2015-08-02 MED ORDER — ONDANSETRON HCL 4 MG/2ML IJ SOLN
4.0000 mg | Freq: Four times a day (QID) | INTRAMUSCULAR | Status: DC | PRN
  Administered 2015-08-02: 4 mg via INTRAVENOUS
  Filled 2015-08-02: qty 2

## 2015-08-02 MED ORDER — MISOPROSTOL 25 MCG QUARTER TABLET
25.0000 ug | ORAL_TABLET | ORAL | Status: DC
  Administered 2015-08-02: 25 ug via ORAL
  Filled 2015-08-02 (×7): qty 1

## 2015-08-02 MED ORDER — LACTATED RINGERS IV SOLN: 500.0000 mL | INTRAVENOUS | Status: DC | PRN

## 2015-08-02 MED ORDER — FENTANYL CITRATE (PF) 100 MCG/2ML IJ SOLN
INTRAMUSCULAR | Status: AC
  Filled 2015-08-02: qty 2

## 2015-08-02 MED ORDER — MISOPROSTOL 200 MCG PO TABS
ORAL_TABLET | ORAL | Status: AC
  Filled 2015-08-02: qty 4

## 2015-08-02 MED ORDER — BUTORPHANOL TARTRATE 1 MG/ML IJ SOLN: 1.0000 mg | Freq: Once | INTRAMUSCULAR | Status: DC

## 2015-08-02 MED ORDER — OXYTOCIN 40 UNITS IN LACTATED RINGERS INFUSION - SIMPLE MED
2.5000 [IU]/h | INTRAVENOUS | Status: DC
  Filled 2015-08-02: qty 1000

## 2015-08-02 MED ORDER — CITRIC ACID-SODIUM CITRATE 334-500 MG/5ML PO SOLN
30.0000 mL | ORAL | Status: DC | PRN
Start: 2015-08-02 — End: 2015-08-03

## 2015-08-02 MED ORDER — BUTORPHANOL TARTRATE 1 MG/ML IJ SOLN
1.0000 mg | INTRAMUSCULAR | Status: DC | PRN
  Administered 2015-08-02: 1 mg via INTRAVENOUS
  Filled 2015-08-02: qty 1

## 2015-08-02 MED ORDER — LIDOCAINE-EPINEPHRINE (PF) 1.5 %-1:200000 IJ SOLN
INTRAMUSCULAR | Status: DC | PRN
  Administered 2015-08-02: 3 mL via PERINEURAL

## 2015-08-02 MED ORDER — LIDOCAINE HCL (PF) 1 % IJ SOLN: 30.0000 mL | INTRAMUSCULAR | Status: DC | PRN

## 2015-08-02 MED ORDER — TERBUTALINE SULFATE 1 MG/ML IJ SOLN
INTRAMUSCULAR | Status: AC
  Filled 2015-08-02: qty 1

## 2015-08-02 MED ORDER — OXYTOCIN BOLUS FROM INFUSION: 500.0000 mL | INTRAVENOUS | Status: DC

## 2015-08-02 NOTE — Anesthesia Procedure Notes (Signed)
Epidural Patient location during procedure: OB Start time: 08/02/2015 10:55 PM End time: 08/02/2015 11:22 PM  Staffing Performed by: anesthesiologist   Preanesthetic Checklist Completed: patient identified, site marked, surgical consent, pre-op evaluation, timeout performed, IV checked, risks and benefits discussed and monitors and equipment checked  Epidural Patient position: sitting Prep: Betadine Patient monitoring: heart rate, continuous pulse ox and blood pressure Approach: midline Location: L4-L5 Injection technique: LOR saline  Needle:  Needle type: Tuohy  Needle gauge: 18 G Needle length: 9 cm and 9 Needle insertion depth: 7 cm Catheter type: closed end flexible Catheter size: 20 Guage Catheter at skin depth: 9 cm Test dose: negative and 1.5% lidocaine with Epi 1:200 K  Assessment Events: blood not aspirated, injection not painful, no injection resistance, negative IV test and no paresthesia  Additional Notes   Patient tolerated the insertion well without complications.Reason for block:procedure for pain

## 2015-08-02 NOTE — Progress Notes (Signed)
Intrapartum progress note  S: patient comfortable, no complaints, breathing through contractions.  +FM no LOF or VB  O:  123/76  98.3  91  18   FHT 130 mod + accels no decels TOCO: q2-5 min SVE: 5/80/-2  AROM for clear fluid  A/P:  34yo G3P2002 @ 40.0 with elective IOL  1. IUP: category 1, continue EFM 2. IOL: s/p cytotec x1, AROM.   3. Expectant management for the next few hours and will reassess.    ----- Larey Days, MD Attending Obstetrician and Gynecologist [facility]

## 2015-08-02 NOTE — Anesthesia Preprocedure Evaluation (Signed)
Anesthesia Evaluation  Patient identified by MRN, date of birth, ID band Patient awake    Reviewed: Allergy & Precautions, NPO status , Patient's Chart, lab work & pertinent test results  History of Anesthesia Complications Negative for: history of anesthetic complications  Airway Mallampati: II       Dental   Pulmonary neg pulmonary ROS,           Cardiovascular negative cardio ROS       Neuro/Psych Vision difficulties, s/p CT and MRI showing pituitary pressure on optic nerve  negative neurological ROS     GI/Hepatic negative GI ROS, Neg liver ROS,   Endo/Other  negative endocrine ROS  Renal/GU negative Renal ROS     Musculoskeletal   Abdominal   Peds  Hematology negative hematology ROS (+)   Anesthesia Other Findings   Reproductive/Obstetrics                             Anesthesia Physical Anesthesia Plan  ASA: II  Anesthesia Plan: Epidural   Post-op Pain Management:    Induction:   Airway Management Planned:   Additional Equipment:   Intra-op Plan:   Post-operative Plan:   Informed Consent: I have reviewed the patients History and Physical, chart, labs and discussed the procedure including the risks, benefits and alternatives for the proposed anesthesia with the patient or authorized representative who has indicated his/her understanding and acceptance.     Plan Discussed with:   Anesthesia Plan Comments:         Anesthesia Quick Evaluation

## 2015-08-02 NOTE — H&P (Signed)
OB History & Physical   History of Present Illness:  Chief Complaint:   HPI:  SIDNEI WORMALD is a 35 y.o. G70P2002 female at [redacted]w[redacted]d dated by LMP c/w 1st trimester [place] with EDC of 08/02/15.  She presents to L&D for IOL due to maternal discomfort at due date.   She has been having contractions but nothing debilitating.   occasional CTX, no LOF, no VB +FM  Pregnancy Issues: 1. History of ischemic bowel in 2015 2. L5 pars defect, may be problem for epidural 3. Elevated 1hr normal 3hr  Maternal Medical History:   Past Medical History  Diagnosis Date  . Ischemic colitis ([place])   . Pancreatitis   . Pars defect of lumbar spine     Past Surgical History  Procedure Laterality Date  . Elbow surgery    . Arthroscopic repair acl Left   . Wisdom tooth extraction      Allergies  Allergen Reactions  . Sulfa Antibiotics     Other reaction(s): Other (See Comments) Blisters in eye with sulfa eye drops    Prior to Admission medications   Medication Sig Start Date End Date Taking? Authorizing Provider  DICLEGIS 10-10 MG TBEC Reported on 07/01/2015 12/13/14   Historical Provider, MD  Promethazine HCl (PHENERGAN PO) Take by mouth.    Historical Provider, MD     Prenatal care site: [facility]  Social History: She  reports that she has never smoked. She does not have any smokeless tobacco history on file. She reports that she does not drink alcohol or use illicit drugs.  Family History: family history is not on file.   Review of Systems: Negative x 10 systems reviewed except as noted in the HPI.     Physical Exam:  Vital Signs: There were no vitals taken for this visit. General: no acute distress.  HEENT: normocephalic, atraumatic Heart: regular rate & rhythm.  No murmurs/rubs/gallops Lungs: clear to auscultation bilaterally, normal respiratory effort Abdomen: soft, gravid, non-tender;  EFW: 7.6 Pelvic:   External: Normal external female genitalia  Cervix: Dilation: 4 / Effacement  (%): 60 / Station: -3    Extremities: non-tender, symmetric, 0 edema bilaterally.  DTRs: 2+  Neurologic: Alert & oriented x 3.    No results found for this or any previous visit (from the past 24 hour(s)).  Pertinent Results:  Prenatal Labs: Blood type/Rh A+  Antibody screen neg  Rubella Immune  Varicella Immune  RPR NR  HBsAg Neg  HIV NR  GC neg  Chlamydia neg  Genetic screening declined  1 hour GTT   3 hour GTT   GBS neg   FHT:130 mod + accels no decels TOCO: q12 min SVE: 4/60/-3 middle medium   Cephalic by leopolds  Assessment:  KEEGAN ALMASRI is a 35 y.o. G14P2002 female at [redacted]w[redacted]d with IOL at term.   Plan:  1. Admit to Labor & Delivery 2. CBC, T&S, Clrs, IVF 3. GBS  neg 4. Consents obtained. 5. Continuous efm/toco 6. IOL: cytotec x1 buccal, repeat q4hr until bishop >=8, then consider pit and AROM.      ----- Larey Days, MD Attending Obstetrician and Gynecologist [facility]

## 2015-08-02 NOTE — Progress Notes (Addendum)
Patient complained of change in vision and loss of visual fields at 7:50.  This was reported to me, and neurological exam was performed.  Visual fields defect noted on the right - if clock face and 12:00 is straight ahead and 3:00 is her right shoulder, loss was noted at about 1:30.  This was confirmed on both eyes on the right. She describes her visual change as blurry/wavy lines like heat on the horizon.  She has no change in vital signs, all other cranial nerves are intact and equal bilaterally.  Motor and strength are 5/5 in all extremities.  No change in speech or thought processes.    VS: 126/79, 98.3, 65, 18 FHT 130 mod + accels no decels TOCO: q3-5 min   Called Neurology on call Dr. Doy Mince, who was not on campus and stated there would be no neurologist available for in-house consult until the morning.  She recommended a STAT CT head without contrast and then MRA/MRV to rule out thrombus, but that MRI would not be available in house until morning.    I also called [facility], and was in touch with Dr. Gertie Exon.  His recommendation was to wait for results before making delivery decision, but to treat mom as needed.  He was going to discuss with his colleagues and Neurology at [facility].  Stat CT ordered and bringing patient down myself.  ----- Larey Days, MD Attending Obstetrician and Gynecologist [facility]   ----------------------- ADDENDUM CT head negative.  Spoke again to Neuro who again recommended MR studies but again reiterated that we would not be able to do these until morning.  I called the MRI tech on call who was in the hospital already.  She was able to arrange for the studies to be done STAT.  Patient given fentanyl for contractions, terbutaline x1 to stop contractions, then stadol prior to MRI.

## 2015-08-02 NOTE — Progress Notes (Signed)
Intrapartum progress note:  UPdate to patient's course:  Change of vision and loss of partial right visual field has improved.  She states she feels like there is a film over her eye.   Less degree of peripheral vision loss on the right.  CT head negative MRI brain negative MRA head negative  MRV head aborted due to increasing contractions and radiologist recommendation due to normal MRI.  I was on the phone with the reading radiologist during the procedure.  She indicated the pituitary gland was enlarged and touching the optic nerve, which could be causing her symptoms.  Negative for apoplexy.  I relayed these findings to Dr. Gertie Exon who recommended continuing the course of induction/labor as planned.   She has asked for an epidural.  The findings and labs have been relayed to Dr. Ronelle Nigh, and he will be up to see the patient shortly.   ----- Larey Days, MD Attending Obstetrician and Gynecologist [facility]

## 2015-08-02 NOTE — Progress Notes (Addendum)
Speaking currently to Dr. Gertie Exon from [facility] MFM, with Dr. Hartford Poli from [facility] Neurology  Essentially treating mom with TPA has the consequence of eliminating surgery as a possibility.  In the case of fetal or maternal distress, surgery is a contraindication of recent administration of tpa.  Obviously this poses a problem to an actively laboring mother.    Should the MRI return with positive findings, then transfer to [place] would be preferred, and they would assume care.  If MRI findings are negative, then we can cautiously but safely keep patient off tpa and see how her symptoms progress or resolve.   MRI continues to be in progress.

## 2015-08-03 DIAGNOSIS — M4306 Spondylolysis, lumbar region: Secondary | ICD-10-CM | POA: Diagnosis present

## 2015-08-03 DIAGNOSIS — Z8719 Personal history of other diseases of the digestive system: Secondary | ICD-10-CM

## 2015-08-03 DIAGNOSIS — D352 Benign neoplasm of pituitary gland: Secondary | ICD-10-CM

## 2015-08-03 DIAGNOSIS — H5340 Unspecified visual field defects: Secondary | ICD-10-CM | POA: Diagnosis not present

## 2015-08-03 LAB — CBC
HCT: 32.8 % — ABNORMAL LOW (ref 35.0–47.0)
HEMOGLOBIN: 11.1 g/dL — AB (ref 12.0–16.0)
MCH: 29.6 pg (ref 26.0–34.0)
MCHC: 33.8 g/dL (ref 32.0–36.0)
MCV: 87.7 fL (ref 80.0–100.0)
Platelets: 189 10*3/uL (ref 150–440)
RBC: 3.74 MIL/uL — ABNORMAL LOW (ref 3.80–5.20)
RDW: 14.1 % (ref 11.5–14.5)
WBC: 16.8 10*3/uL — ABNORMAL HIGH (ref 3.6–11.0)

## 2015-08-03 LAB — RPR: RPR Ser Ql: NONREACTIVE

## 2015-08-03 LAB — CORTISOL: CORTISOL PLASMA: 8.4 ug/dL

## 2015-08-03 LAB — TSH: TSH: 3.726 u[IU]/mL (ref 0.350–4.500)

## 2015-08-03 MED ORDER — ONDANSETRON HCL 4 MG/2ML IJ SOLN: 4.0000 mg | INTRAMUSCULAR | Status: DC | PRN

## 2015-08-03 MED ORDER — ONDANSETRON HCL 4 MG PO TABS: 4.0000 mg | ORAL_TABLET | ORAL | Status: DC | PRN

## 2015-08-03 MED ORDER — LIDOCAINE 5 % EX PTCH: 1.0000 | MEDICATED_PATCH | CUTANEOUS | Status: DC

## 2015-08-03 MED ORDER — DOCUSATE SODIUM 100 MG PO CAPS
100.0000 mg | ORAL_CAPSULE | Freq: Two times a day (BID) | ORAL | Status: DC
  Administered 2015-08-03 – 2015-08-05 (×4): 100 mg via ORAL
  Filled 2015-08-03 (×4): qty 1

## 2015-08-03 MED ORDER — DIBUCAINE 1 % RE OINT: 1.0000 "application " | TOPICAL_OINTMENT | RECTAL | Status: DC | PRN

## 2015-08-03 MED ORDER — LANOLIN HYDROUS EX OINT: TOPICAL_OINTMENT | CUTANEOUS | Status: DC | PRN

## 2015-08-03 MED ORDER — SIMETHICONE 80 MG PO CHEW: 80.0000 mg | CHEWABLE_TABLET | ORAL | Status: DC | PRN

## 2015-08-03 MED ORDER — LIDOCAINE 5 % EX PTCH
MEDICATED_PATCH | CUTANEOUS | Status: AC
  Administered 2015-08-03: 05:00:00
  Filled 2015-08-03: qty 1

## 2015-08-03 MED ORDER — BENZOCAINE-MENTHOL 20-0.5 % EX AERO
1.0000 | INHALATION_SPRAY | CUTANEOUS | Status: DC | PRN
Start: 2015-08-03 — End: 2015-08-05
  Filled 2015-08-03 (×2): qty 56

## 2015-08-03 MED ORDER — PRENATAL MULTIVITAMIN CH
1.0000 | ORAL_TABLET | Freq: Every day | ORAL | Status: DC
  Administered 2015-08-03 – 2015-08-05 (×3): 1 via ORAL
  Filled 2015-08-03 (×3): qty 1

## 2015-08-03 MED ORDER — HYDROMORPHONE HCL 1 MG/ML IJ SOLN
1.0000 mg | INTRAMUSCULAR | Status: DC | PRN
  Administered 2015-08-03 – 2015-08-04 (×4): 1 mg via INTRAVENOUS
  Filled 2015-08-03 (×4): qty 1

## 2015-08-03 MED ORDER — OXYCODONE-ACETAMINOPHEN 5-325 MG PO TABS
2.0000 | ORAL_TABLET | ORAL | Status: DC | PRN
  Administered 2015-08-03 – 2015-08-05 (×12): 2 via ORAL
  Filled 2015-08-03 (×12): qty 2

## 2015-08-03 MED ORDER — KETOROLAC TROMETHAMINE 30 MG/ML IJ SOLN
30.0000 mg | Freq: Once | INTRAMUSCULAR | Status: AC
  Administered 2015-08-03: 30 mg via INTRAVENOUS

## 2015-08-03 MED ORDER — HYDROMORPHONE HCL 1 MG/ML IJ SOLN: 0.5000 mg | INTRAMUSCULAR | Status: DC | PRN

## 2015-08-03 MED ORDER — HYDROCORTISONE 2.5 % RE CREA
TOPICAL_CREAM | RECTAL | Status: DC | PRN
  Filled 2015-08-03: qty 28.35

## 2015-08-03 MED ORDER — WITCH HAZEL-GLYCERIN EX PADS: 1.0000 "application " | MEDICATED_PAD | CUTANEOUS | Status: DC | PRN

## 2015-08-03 MED ORDER — IBUPROFEN 600 MG PO TABS
600.0000 mg | ORAL_TABLET | Freq: Four times a day (QID) | ORAL | Status: DC
  Administered 2015-08-03 – 2015-08-04 (×7): 600 mg via ORAL
  Filled 2015-08-03 (×8): qty 1

## 2015-08-03 MED ORDER — OXYCODONE-ACETAMINOPHEN 5-325 MG PO TABS
1.0000 | ORAL_TABLET | ORAL | Status: DC | PRN
  Administered 2015-08-03: 1 via ORAL

## 2015-08-03 MED ORDER — DIPHENHYDRAMINE HCL 25 MG PO CAPS
25.0000 mg | ORAL_CAPSULE | Freq: Four times a day (QID) | ORAL | Status: DC | PRN
  Administered 2015-08-04: 12.5 mg via ORAL
  Filled 2015-08-03: qty 1

## 2015-08-03 MED ORDER — OXYCODONE-ACETAMINOPHEN 5-325 MG PO TABS
ORAL_TABLET | ORAL | Status: AC
  Filled 2015-08-03: qty 1

## 2015-08-03 MED ORDER — KETOROLAC TROMETHAMINE 30 MG/ML IJ SOLN
INTRAMUSCULAR | Status: AC
  Filled 2015-08-03: qty 1

## 2015-08-03 MED ORDER — PRAMOXINE HCL 1 % RE FOAM
Freq: Three times a day (TID) | RECTAL | Status: DC | PRN
  Filled 2015-08-03 (×2): qty 15

## 2015-08-03 NOTE — Consult Note (Signed)
Reason for Consult:Vision changes Referring Physician: Ward   CC: Vision changes  HPI: Ellen Blankenship is an 35 y.o. female who was in labor last evening.  She reports that her water was broken and her pain became very severe.  It was at that time that she began to note that she could not see in her left visual field.  Stat head CT was unremarkable and the patient was able to complete labor.  Today she reports improvement in vision but some continued haziness in her right visual field.   This is her third pregnancy.  Previous pregnancies were unremarkable.    Past Medical History  Diagnosis Date  . Ischemic colitis ([place])   . Pancreatitis   . Pars defect of lumbar spine     Past Surgical History  Procedure Laterality Date  . Elbow surgery    . Arthroscopic repair acl Left   . Wisdom tooth extraction      Family history: maternal great-grandmother deceased from a cerebral aneurysm.  Maternal grandmother deceased from 25.    Social History:  reports that she has never smoked. She does not have any smokeless tobacco history on file. She reports that she does not drink alcohol or use illicit drugs.  Allergies  Allergen Reactions  . Sulfa Antibiotics     Other reaction(s): Other (See Comments) Blisters in eye with sulfa eye drops    Medications:  I have reviewed the patient's current medications. Prior to Admission:  Prescriptions prior to admission  Medication Sig Dispense Refill Last Dose  . DICLEGIS 10-10 MG TBEC Reported on 08/02/2015  1 Not Taking at Unknown time  . Promethazine HCl (PHENERGAN PO) Take by mouth. Reported on 08/02/2015   Not Taking at Unknown time   Scheduled: . docusate sodium  100 mg Oral BID  . ibuprofen  600 mg Oral 4 times per day  . ketorolac      . oxyCODONE-acetaminophen      . prenatal multivitamin  1 tablet Oral Q1200    ROS: History obtained from the patient  General ROS: negative for - chills, fatigue, fever, night sweats, weight gain or  weight loss Psychological ROS: negative for - behavioral disorder, hallucinations, memory difficulties, mood swings or suicidal ideation Ophthalmic ROS: negative for - blurry vision, double vision, eye pain or loss of vision ENT ROS: negative for - epistaxis, nasal discharge, oral lesions, sore throat, tinnitus or vertigo Allergy and Immunology ROS: negative for - hives or itchy/watery eyes Hematological and Lymphatic ROS: negative for - bleeding problems, bruising or swollen lymph nodes Endocrine ROS: negative for - galactorrhea, hair pattern changes, polydipsia/polyuria or temperature intolerance Respiratory ROS: negative for - cough, hemoptysis, shortness of breath or wheezing Cardiovascular ROS: lower extremity swelling Gastrointestinal ROS: negative for - abdominal pain, diarrhea, hematemesis, nausea/vomiting or stool incontinence Genito-Urinary ROS: negative for - dysuria, hematuria, incontinence or urinary frequency/urgency Musculoskeletal ROS: buttock pain Neurological ROS: as noted in HPI Dermatological ROS: negative for rash and skin lesion changes  Physical Examination: Blood pressure 104/54, pulse 59, temperature 97.9 F (36.6 C), temperature source Oral, resp. rate 10, SpO2 96 %, unknown if currently breastfeeding.  HEENT-  Normocephalic, no lesions, without obvious abnormality.  Normal external eye and conjunctiva.  Normal TM's bilaterally.  Normal auditory canals and external ears. Normal external nose, mucus membranes and septum.  Normal pharynx. Cardiovascular- S1, S2 normal, pulses palpable throughout   Lungs- chest clear, no wheezing, rales, normal symmetric air entry Abdomen- soft, non-tender; bowel  sounds normal; no masses,  no organomegaly Extremities- bilateral lower extremity edema Lymph-no adenopathy palpable Musculoskeletal-no joint tenderness, deformity  Skin-warm and dry, no hyperpigmentation, vitiligo, or suspicious lesions  Neurological Examination Mental  Status: Alert, oriented, thought content appropriate.  Speech fluent without evidence of aphasia.  Able to follow 3 step commands without difficulty. Cranial Nerves: II: Discs flat bilaterally; Patient able to appreciate object and colors in all visual fields but reports some haziness in her right visual field, pupils equal, round, reactive to light and accommodation III,IV, VI: ptosis not present, extra-ocular motions intact bilaterally V,VII: smile symmetric, facial light touch sensation normal bilaterally VIII: hearing normal bilaterally IX,X: gag reflex present XI: bilateral shoulder shrug XII: midline tongue extension Motor: Right : Upper extremity   5/5    Left:     Upper extremity   5/5  Lower extremity   5/5     Lower extremity   5/5 Tone and bulk:normal tone throughout; no atrophy noted Sensory: Pinprick and light touch intact throughout, bilaterally Deep Tendon Reflexes: 2+ and symmetric throughout Plantars: Right: downgoing   Left: downgoing Cerebellar: normal finger-to-nose, normal rapid alternating movements and normal heel-to-shin test Gait: normal gait and station      Laboratory Studies:   Basic Metabolic Panel: No results for input(s): NA, K, CL, CO2, GLUCOSE, BUN, CREATININE, CALCIUM, MG, PHOS in the last 168 hours.  Liver Function Tests: No results for input(s): AST, ALT, ALKPHOS, BILITOT, PROT, ALBUMIN in the last 168 hours. No results for input(s): LIPASE, AMYLASE in the last 168 hours. No results for input(s): AMMONIA in the last 168 hours.  CBC:  Recent Labs Lab 08/02/15 1343 08/03/15 0814  WBC 10.5 16.8*  HGB 11.7* 11.1*  HCT 33.7* 32.8*  MCV 86.0 87.7  PLT 215 189    Cardiac Enzymes: No results for input(s): CKTOTAL, CKMB, CKMBINDEX, TROPONINI in the last 168 hours.  BNP: Invalid input(s): POCBNP  CBG: No results for input(s): GLUCAP in the last 168 hours.  Microbiology: Results for orders placed or performed during the hospital  encounter of 08/02/15  OB RESULT CONSOLE Group B Strep     Status: None   Collection Time: 07/01/15 12:00 AM  Result Value Ref Range Status   GBS Negative  Final    Coagulation Studies: No results for input(s): LABPROT, INR in the last 72 hours.  Urinalysis: No results for input(s): COLORURINE, LABSPEC, PHURINE, GLUCOSEU, HGBUR, BILIRUBINUR, KETONESUR, PROTEINUR, UROBILINOGEN, NITRITE, LEUKOCYTESUR in the last 168 hours.  Invalid input(s): APPERANCEUR  Lipid Panel:     Component Value Date/Time   TRIG 129 06/20/2014 0438    HgbA1C: No results found for: HGBA1C  Urine Drug Screen:  No results found for: LABOPIA, COCAINSCRNUR, LABBENZ, AMPHETMU, THCU, LABBARB  Alcohol Level: No results for input(s): ETH in the last 168 hours.   Imaging: Ct Head Wo Contrast  08/02/2015  CLINICAL DATA:  35 year old female with sudden onset visual field defects on the right. EXAM: CT HEAD WITHOUT CONTRAST TECHNIQUE: Contiguous axial images were obtained from the base of the skull through the vertex without intravenous contrast. COMPARISON:  None. FINDINGS: The ventricles and the sulci are appropriate in size for the patient's age. There is no intracranial hemorrhage. No midline shift or mass effect identified. The gray-white matter differentiation is preserved. The visualized paranasal sinuses and mastoid air cells are well aerated. The calvarium is intact. IMPRESSION: No acute intracranial pathology. Electronically Signed   By: Anner Crete M.D.   On: 08/02/2015 20:47  Mr Jodene Nam Head Wo Contrast  08/02/2015  CLINICAL DATA:  Visual field loss beginning this evening, [redacted] weeks pregnant in labor. History of ischemic bowel. Family history of aneurysm. EXAM: MRI HEAD WITHOUT CONTRAST MRA HEAD WITHOUT CONTRAST TECHNIQUE: Multiplanar, multiecho pulse sequences of the brain and surrounding structures were obtained without intravenous contrast. Angiographic images of the head were obtained using MRA technique  without contrast. COMPARISON:  None. FINDINGS: MRI HEAD FINDINGS The ventricles and sulci are normal for patient's age. No abnormal parenchymal signal, mass lesions, mass effect. No reduced diffusion to suggest acute ischemia. No susceptibility artifact to suggest hemorrhage. No abnormal extra-axial fluid collections. No extra-axial masses though, contrast enhanced sequences would be more sensitive. Normal major intracranial vascular flow voids seen at the skull base. Normal superior sagittal sinus flow void, and no abnormal extra-axial susceptibility artifact is suggests major dural venous sinus thrombosis. Ocular globes and orbital contents are unremarkable though not tailored for evaluation. Enlarged homogeneous sella contacts the undersurface of the intracranial optic nerves. No suspicious calvarial bone marrow signal. Craniocervical junction maintained. LEFT sphenoid mucosal retention cyst without paranasal sinus air-fluid levels. MRA HEAD FINDINGS Anterior circulation: Normal flow related enhancement of the included cervical, petrous, cavernous and supraclinoid internal carotid arteries. Normal flow related enhancement of the anterior and middle cerebral arteries, including distal segments. No large vessel occlusion, high-grade stenosis, abnormal luminal irregularity, aneurysm. Posterior circulation: Codominant vertebral arteries. Basilar artery is patent, with normal flow related enhancement of the main branch vessels. Normal flow related enhancement of the posterior cerebral arteries. No large vessel occlusion, high-grade stenosis, abnormal luminal irregularity, aneurysm. IMPRESSION: Normal MRI head. Normal MRA head. Acute findings discussed with and reconfirmed by Dr.CHELSEA WARD on 08/02/2015 at 2215 pm. Electronically Signed   By: Elon Alas M.D.   On: 08/02/2015 22:51   Mr Brain Wo Contrast  08/02/2015  CLINICAL DATA:  Visual field loss beginning this evening, [redacted] weeks pregnant in labor. History  of ischemic bowel. Family history of aneurysm. EXAM: MRI HEAD WITHOUT CONTRAST MRA HEAD WITHOUT CONTRAST TECHNIQUE: Multiplanar, multiecho pulse sequences of the brain and surrounding structures were obtained without intravenous contrast. Angiographic images of the head were obtained using MRA technique without contrast. COMPARISON:  None. FINDINGS: MRI HEAD FINDINGS The ventricles and sulci are normal for patient's age. No abnormal parenchymal signal, mass lesions, mass effect. No reduced diffusion to suggest acute ischemia. No susceptibility artifact to suggest hemorrhage. No abnormal extra-axial fluid collections. No extra-axial masses though, contrast enhanced sequences would be more sensitive. Normal major intracranial vascular flow voids seen at the skull base. Normal superior sagittal sinus flow void, and no abnormal extra-axial susceptibility artifact is suggests major dural venous sinus thrombosis. Ocular globes and orbital contents are unremarkable though not tailored for evaluation. Enlarged homogeneous sella contacts the undersurface of the intracranial optic nerves. No suspicious calvarial bone marrow signal. Craniocervical junction maintained. LEFT sphenoid mucosal retention cyst without paranasal sinus air-fluid levels. MRA HEAD FINDINGS Anterior circulation: Normal flow related enhancement of the included cervical, petrous, cavernous and supraclinoid internal carotid arteries. Normal flow related enhancement of the anterior and middle cerebral arteries, including distal segments. No large vessel occlusion, high-grade stenosis, abnormal luminal irregularity, aneurysm. Posterior circulation: Codominant vertebral arteries. Basilar artery is patent, with normal flow related enhancement of the main branch vessels. Normal flow related enhancement of the posterior cerebral arteries. No large vessel occlusion, high-grade stenosis, abnormal luminal irregularity, aneurysm. IMPRESSION: Normal MRI head. Normal  MRA head. Acute findings discussed with and reconfirmed  by Dr.CHELSEA WARD on 08/02/2015 at 2215 pm. Electronically Signed   By: Elon Alas M.D.   On: 08/02/2015 22:51     Assessment/Plan: 35 year old female with a right homonymous hemianopia that has improved since delivery but is not back to baseline.  Head CT personally reviewed and shows no acute changes.  MRI personally reviewed and shows a large pituitary with some contact on the optic nerves noted.  No other abnormalities seen.  No evidence of pre-eclampsia/eclampsia in blood work.  Although not atypical visual disturbance seen with pituitary pressure on the optic nerves, this remains the most likely explanation at this point that was exacerbated by the increased ICP due to labor.  It is concerning that the patient is still not back to baseline.  Have discussed with the husband and patient this concern with start of bromocryptine and discontinuation of breast feeding recommended.  They would like to discuss this further before making a recommendation.    Recommendations: 1.  Patient to follow up with neurology at discharge 2.  To be paged once family has come to a decision 3.  Although lactotroph hyperplasia most likely will rule out other pituitary abnormalities with lab work to include prolactin, TSH, IGF-1, cortisol, LH, FSH and ACTH.    Alexis Goodell, MD Neurology [phone] 08/03/2015, 12:17 PM

## 2015-08-03 NOTE — Progress Notes (Signed)
OB Note B/l LE dopplers ordered, per neuro request. Pt states that she feels that her vision is back to normal. Pt told to let us know any problems or issues.  Durene Romans MD [facility]  Pager: [phone]

## 2015-08-03 NOTE — Progress Notes (Signed)
Patient c/o seeing large "black spot" in field of vision.  Right sided loss of peripheral vision.  MD notified.   CT ordered, patient taken down by W/C, accompanied by md and rn, returned to ldr 05 at 2050.  2140- taken to MRI via wc, stadol given ivp, accompanied by md and rn. 2227 returned to ldr 5.  ivf bolus started for epidural.

## 2015-08-03 NOTE — Progress Notes (Signed)
Post Partum Day 0 Subjective: Doing well, has had consult with neurology MD this am and has made the decision to continue breastfeeding rather than take medication for Pituitary. Pt states her vision is improving especially following a nap she had this afternoon.  Has follow up with Neuro on Monday.  Tolerating regular diet, voiding and ambulating without difficulty. Has severe coccyx pain which is controlled with PO meds.  No CP SOB F/C N/V or leg pain No HA, RUQ/epigastric pain. Field of vision has improved  Objective: BP 98/53 mmHg  Pulse 51  Temp(Src) 97.8 F (36.6 C) (Oral)  Resp 18  SpO2 95%  Breastfeeding? Unknown  Physical Exam:  General: NAD CV: RRR Pulm: nl effort, CTABL Lochia: moderate Uterine Fundus: fundus firm and below umbilicus DVT Evaluation: no cords, ttp LEs    Recent Labs  08/02/15 1343 08/03/15 0814  HGB 11.7* 11.1*  HCT 33.7* 32.8*  WBC 10.5 16.8*  PLT 215 189    Assessment/Plan: 35 y.o. G3P3003 postpartum day # 0  1. Continue postpartum care 2. Breastfeeding 3. Home day 1 or 2    Aamiyah Derrick, CNM   This patient and plan were discussed with Dr Ilda Basset 08/03/2015

## 2015-08-03 NOTE — Progress Notes (Addendum)
OB Note Received signout from Dr. Leonides Schanz. Negative imaging thus far and she just got off the phone with Dr. Doy Mince of Neurology who will come by and assess patient. VS normal and stable.   Durene Romans MD [facility]  Pager: [phone]

## 2015-08-03 NOTE — Discharge Summary (Signed)
Obstetrical Discharge Summary  Patient Name: Ellen Blankenship DOB: 07-10-1980 MRN: [id number]  Date of Admission: 08/02/2015 Date of Discharge: 08/05/15  Primary OB: [facility]   Gestational Age at Delivery: [redacted]w[redacted]d   Antepartum complications: History of ischemic bowel, history of L5 pars defect, elevated 1hr normal 3hr  Admitting Diagnosis: elective induction of labor at term Secondary Diagnosis: Patient Active Problem List   Diagnosis Date Noted  . Labor and delivery, indication for care 08/02/2015    Augmentation: AROM, Pitocin and Cytotec Complications: None Intrapartum complications/course: Ellen Blankenship was admitted for elective IOL at 40.0 weeks and was given cytotec x1 then AROM'd for clear fluid.  She developed acute onset visual fields defect and both CT and MRI/MRA of the head were performed and negative.  Pituitary was enlarged and sitting on optic nerve.  Decision was made to keep her in-house and continue with IOL.  Epidural placed and pitocin started.  Patient progressed to complete and 2nd stage was 30 minutes.  Baby delivered OP with loose nuchal x1 and placed on mom's chest. Delay of cord clamping x >60sec.  Placenta delivered spontaneously, intact. 2nd degree repaired with 3-0 vicryl in standard fashion.   Date of Delivery: 08/03/15 Delivered By: Ellen Blankenship Ward Delivery Type: spontaneous vaginal delivery Anesthesia: epidural Placenta: spontaneous Laceration: 2nd degree Episiotomy: none Newborn Data: Live born female  Birth Weight:  9#9oz (4340 gm) APGAR: 8,9     Discharge Physical Exam:  Filed Vitals:   08/05/15 0032 08/05/15 0333 08/05/15 0823 08/05/15 1131  BP: 112/68  116/67   Pulse: 80  62   Temp: 98 F (36.7 C) 98.4 F (36.9 C) 98.3 F (36.8 C) 98.3 F (36.8 C)  TempSrc: Oral Oral Oral Oral  Resp:   18   SpO2:   93%    General: NAD CV: RRR Pulm: CTABL, nl effort ABD:  fundus firm and below the umbilicus, nodules noted superficially Lochia:  moderate DVT Evaluation: LE non-ttp, no evidence of DVT on exam. Neuro: normal bilateral vision, no other neuro sx   HCT: 3/2: 33.7 - 3/3: 32.8   Post partum course: PP course notable for significant coccyx pain which pt is managing with positioning (side-lying or laying down), po meds and will plan to use donut pillow once at home. Her acute onset visual field defect was completely resolved by PPD #2. Pt will f/u with neurology outpatient.  Postpartum Procedures: none Disposition: stable, discharge to home. Baby Feeding: breastmilk Baby Disposition: home with mom  Rh Immune globulin given: n/a Rubella vaccine given: n/a Tdap vaccine given in AP or PP setting: antepartum Flu vaccine given in AP or PP setting: no  Contraception: undecided (IUD vs minipill)  Prenatal Labs:  Blood type/Rh A positive  Antibody screen negative  Rubella Immune  Varicella Immune  RPR NR  HBsAg negative  HIV negative  GC negative  Chlamydia negative  Genetic screening Declined  1 hour GTT 147  3 hour GTT 78, 146, 115, 103 (all normal)  GBS negative       Pap cotest negative 2016   Plan:  Ellen Blankenship was discharged to home in good condition. Follow-up appointment at [place] with Dr Leonides Schanz in 6 weeks   Discharge Medications:   Medication List    STOP taking these medications        DICLEGIS 10-10 MG Tbec  Generic drug:  Doxylamine-Pyridoxine      TAKE these medications        ibuprofen 600 MG  tablet  Commonly known as:  ADVIL,MOTRIN  Take 1 tablet (600 mg total) by mouth every 6 (six) hours.     oxyCODONE-acetaminophen 5-325 MG tablet  Commonly known as:  PERCOCET/ROXICET  Take 1-2 tablets by mouth every 4 (four) hours as needed (pain scale 4-7).               Ellen Blankenship, CNM

## 2015-08-04 ENCOUNTER — Inpatient Hospital Stay: Payer: BLUE CROSS/BLUE SHIELD

## 2015-08-04 NOTE — Anesthesia Postprocedure Evaluation (Signed)
Anesthesia Post Note  Patient: Ellen Blankenship  Procedure(s) Performed: * No procedures listed *  Patient location during evaluation: Mother Baby Anesthesia Type: General and Epidural Level of consciousness: awake and alert Pain management: pain level controlled Vital Signs Assessment: post-procedure vital signs reviewed and stable Respiratory status: spontaneous breathing, nonlabored ventilation, respiratory function stable and patient connected to nasal cannula oxygen Cardiovascular status: blood pressure returned to baseline and stable Postop Assessment: no signs of nausea or vomiting Anesthetic complications: no    Last Vitals:  Filed Vitals:   08/04/15 0424 08/04/15 0734  BP: 92/49 117/70  Pulse: 54 70  Temp: 36.8 C 36.5 C  Resp: 19 16    Last Pain:  Filed Vitals:   08/04/15 1204  PainSc: 5                  Martha Clan

## 2015-08-04 NOTE — Progress Notes (Signed)
Post Partum Day1:  SVD. Labor complicated by loss of vision in Right eye-MRI and CT scan were remarkable for an enlarged pituitary which may be pressing on optic nerve and worsened by increased IC pressure during labor. Subjective: Vision normal. Has decided to breast feed. Tolerating regular diet. Having a lot of tailbone pain (same as post last delivery with a smaller baby) and received dilaudid last night IV for pain, Lidocaine patch over coccygeal area, and oral pain meds thru the night. Protofoam HC helping with perineql discomfort. Also having some back pain radiating up and down from epidural site. Voiding without difficulty Objective: Blood pressure 117/70, pulse 70, temperature 97.7 F (36.5 C), temperature source Oral, resp. rate 16, SpO2 95 %, unknown if currently breastfeeding.  Physical Exam:  General: alert, no distress and asking appropriate questions Lochia: appropriate Uterine Fundus: firm at U/ ML/ NT Perineum: clean, intact, no hematoma, minimal swelling DVT Evaluation: No evidence of DVT seen on physical exam. Dopplers on lower extremities this AM was negative for DVT   Recent Labs  08/02/15 1343 08/03/15 0814  HGB 11.7* 11.1*  HCT 33.7* 32.8*  WBC 10.5 16.8*  PLT 215 189   TSH and cortisol were normal. Other labs were pending  Assessment/Plan: PPD #1 stable Coccygeal pain Perineal pain S/p loss of vision in right eye-now resolved  Enlarged pituitary gland   Plan for discharge tomorrow  Start sitz baths Encouraged to avoid sitting up, recommend lying on side as much as possible due to coccygeal and perineal pain Refill Proctofoam K PAD for back if desires A POS/ RI/ VI/ TDAP UTD Breast feeding Will need to check when patient needs to be followed up by neurology      LOS: 2 days   Ellen Blankenship 08/04/2015, 11:53 AM

## 2015-08-04 NOTE — Progress Notes (Signed)
Pt back from Radiology.  Pt placed in bed with help from OR transport [place].

## 2015-08-05 MED ORDER — IBUPROFEN 600 MG PO TABS
600.0000 mg | ORAL_TABLET | Freq: Four times a day (QID) | ORAL | Status: DC
  Administered 2015-08-05 (×2): 600 mg via ORAL
  Filled 2015-08-05 (×2): qty 1

## 2015-08-05 MED ORDER — LIDOCAINE 5 % EX PTCH: 1.0000 | MEDICATED_PATCH | CUTANEOUS | Status: DC

## 2015-08-05 MED ORDER — OXYCODONE HCL 5 MG PO TABS: 5.0000 mg | ORAL_TABLET | Freq: Three times a day (TID) | ORAL | Status: DC | PRN

## 2015-08-05 MED ORDER — OXYCODONE-ACETAMINOPHEN 5-325 MG PO TABS: 1.0000 | ORAL_TABLET | ORAL | Status: DC | PRN

## 2015-08-05 MED ORDER — LIDOCAINE 5 % EX PTCH
1.0000 | MEDICATED_PATCH | CUTANEOUS | Status: DC
  Administered 2015-08-05: 1 via TRANSDERMAL
  Filled 2015-08-05: qty 1

## 2015-08-05 MED ORDER — IBUPROFEN 600 MG PO TABS: 600.0000 mg | ORAL_TABLET | Freq: Four times a day (QID) | ORAL | Status: DC

## 2015-08-05 NOTE — Progress Notes (Signed)
Pt discharged home with infant.  Discharge instructions and follow up appointment given to and reviewed with pt.  Pt verbalized understanding.  Escorted by auxillary. 

## 2015-08-05 NOTE — Discharge Instructions (Signed)
Discharge instructions:  ° °Call office if you have any of the following: headache, visual changes, fever >100 F, chills, breast concerns, excessive vaginal bleeding, incision drainage or problems, leg pain or redness, depression or any other concerns.  ° °Activity: Do not lift > 10 lbs for 6 weeks.  °No intercourse or tampons for 6 weeks.  °No driving for 1-2 weeks.  ° °

## 2015-08-06 LAB — INSULIN-LIKE GROWTH FACTOR: SOMATOMEDIN C: 238 ng/mL (ref 73–243)

## 2015-08-06 LAB — PROLACTIN: PROLACTIN: 351.3 ng/mL — AB (ref 4.8–23.3)

## 2015-08-06 LAB — FOLLICLE STIMULATING HORMONE: FSH: <0.2 m[IU]/mL

## 2015-08-06 LAB — ACTH: C206 ACTH: 3.2 pg/mL — ABNORMAL LOW (ref 7.2–63.3)

## 2015-08-06 LAB — LUTEINIZING HORMONE: LH: <0.2 m[IU]/mL

## 2015-09-03 DIAGNOSIS — E221 Hyperprolactinemia: Secondary | ICD-10-CM | POA: Diagnosis not present

## 2015-09-03 DIAGNOSIS — R202 Paresthesia of skin: Secondary | ICD-10-CM | POA: Diagnosis not present

## 2015-09-03 DIAGNOSIS — R200 Anesthesia of skin: Secondary | ICD-10-CM | POA: Diagnosis not present

## 2015-09-03 DIAGNOSIS — R51 Headache: Secondary | ICD-10-CM | POA: Diagnosis not present

## 2015-09-17 DIAGNOSIS — E221 Hyperprolactinemia: Secondary | ICD-10-CM | POA: Diagnosis not present

## 2015-09-24 DIAGNOSIS — N61 Mastitis without abscess: Secondary | ICD-10-CM | POA: Diagnosis not present

## 2015-09-24 DIAGNOSIS — E237 Disorder of pituitary gland, unspecified: Secondary | ICD-10-CM | POA: Diagnosis not present

## 2015-09-26 DIAGNOSIS — Z30430 Encounter for insertion of intrauterine contraceptive device: Secondary | ICD-10-CM | POA: Diagnosis not present

## 2015-10-01 DIAGNOSIS — E221 Hyperprolactinemia: Secondary | ICD-10-CM | POA: Diagnosis not present

## 2015-10-12 DIAGNOSIS — Z30430 Encounter for insertion of intrauterine contraceptive device: Secondary | ICD-10-CM | POA: Diagnosis not present

## 2015-10-12 DIAGNOSIS — Z3202 Encounter for pregnancy test, result negative: Secondary | ICD-10-CM | POA: Diagnosis not present

## 2015-10-17 DIAGNOSIS — D352 Benign neoplasm of pituitary gland: Secondary | ICD-10-CM | POA: Diagnosis not present

## 2015-10-25 ENCOUNTER — Other Ambulatory Visit: Payer: Self-pay | Admitting: Neurology

## 2015-10-25 DIAGNOSIS — E221 Hyperprolactinemia: Secondary | ICD-10-CM

## 2015-11-02 LAB — HM PAP SMEAR

## 2015-11-09 DIAGNOSIS — Z30431 Encounter for routine checking of intrauterine contraceptive device: Secondary | ICD-10-CM | POA: Diagnosis not present

## 2015-11-10 DIAGNOSIS — M5432 Sciatica, left side: Secondary | ICD-10-CM | POA: Diagnosis not present

## 2015-11-10 DIAGNOSIS — M9905 Segmental and somatic dysfunction of pelvic region: Secondary | ICD-10-CM | POA: Diagnosis not present

## 2015-11-10 DIAGNOSIS — M9903 Segmental and somatic dysfunction of lumbar region: Secondary | ICD-10-CM | POA: Diagnosis not present

## 2015-11-10 DIAGNOSIS — S335XXA Sprain of ligaments of lumbar spine, initial encounter: Secondary | ICD-10-CM | POA: Diagnosis not present

## 2015-11-10 DIAGNOSIS — M5416 Radiculopathy, lumbar region: Secondary | ICD-10-CM | POA: Diagnosis not present

## 2015-11-12 DIAGNOSIS — M5416 Radiculopathy, lumbar region: Secondary | ICD-10-CM | POA: Diagnosis not present

## 2015-11-12 DIAGNOSIS — M9903 Segmental and somatic dysfunction of lumbar region: Secondary | ICD-10-CM | POA: Diagnosis not present

## 2015-11-12 DIAGNOSIS — M9905 Segmental and somatic dysfunction of pelvic region: Secondary | ICD-10-CM | POA: Diagnosis not present

## 2015-11-12 DIAGNOSIS — M5432 Sciatica, left side: Secondary | ICD-10-CM | POA: Diagnosis not present

## 2015-11-14 ENCOUNTER — Ambulatory Visit
Admission: RE | Admit: 2015-11-14 | Discharge: 2015-11-14 | Disposition: A | Discharge status: Home / Self Care | Payer: BLUE CROSS/BLUE SHIELD | Source: Ambulatory Visit | Attending: Neurology | Admitting: Neurology

## 2015-11-14 DIAGNOSIS — M9903 Segmental and somatic dysfunction of lumbar region: Secondary | ICD-10-CM | POA: Diagnosis not present

## 2015-11-14 DIAGNOSIS — R51 Headache: Secondary | ICD-10-CM | POA: Diagnosis not present

## 2015-11-14 DIAGNOSIS — M5432 Sciatica, left side: Secondary | ICD-10-CM | POA: Diagnosis not present

## 2015-11-14 DIAGNOSIS — E221 Hyperprolactinemia: Secondary | ICD-10-CM | POA: Insufficient documentation

## 2015-11-14 DIAGNOSIS — M9905 Segmental and somatic dysfunction of pelvic region: Secondary | ICD-10-CM | POA: Diagnosis not present

## 2015-11-14 DIAGNOSIS — M5416 Radiculopathy, lumbar region: Secondary | ICD-10-CM | POA: Diagnosis not present

## 2015-11-14 MED ORDER — GADOBENATE DIMEGLUMINE 529 MG/ML IV SOLN
10.0000 mL | Freq: Once | INTRAVENOUS | Status: AC | PRN
  Administered 2015-11-14: 6 mL via INTRAVENOUS

## 2015-11-15 DIAGNOSIS — M5416 Radiculopathy, lumbar region: Secondary | ICD-10-CM | POA: Diagnosis not present

## 2015-11-15 DIAGNOSIS — M9903 Segmental and somatic dysfunction of lumbar region: Secondary | ICD-10-CM | POA: Diagnosis not present

## 2015-11-15 DIAGNOSIS — M9905 Segmental and somatic dysfunction of pelvic region: Secondary | ICD-10-CM | POA: Diagnosis not present

## 2015-11-15 DIAGNOSIS — M5432 Sciatica, left side: Secondary | ICD-10-CM | POA: Diagnosis not present

## 2015-11-19 DIAGNOSIS — M5416 Radiculopathy, lumbar region: Secondary | ICD-10-CM | POA: Diagnosis not present

## 2015-11-19 DIAGNOSIS — M9903 Segmental and somatic dysfunction of lumbar region: Secondary | ICD-10-CM | POA: Diagnosis not present

## 2015-11-19 DIAGNOSIS — M5432 Sciatica, left side: Secondary | ICD-10-CM | POA: Diagnosis not present

## 2015-11-19 DIAGNOSIS — M9905 Segmental and somatic dysfunction of pelvic region: Secondary | ICD-10-CM | POA: Diagnosis not present

## 2015-11-23 DIAGNOSIS — M9903 Segmental and somatic dysfunction of lumbar region: Secondary | ICD-10-CM | POA: Diagnosis not present

## 2015-11-23 DIAGNOSIS — M9905 Segmental and somatic dysfunction of pelvic region: Secondary | ICD-10-CM | POA: Diagnosis not present

## 2015-11-23 DIAGNOSIS — M5432 Sciatica, left side: Secondary | ICD-10-CM | POA: Diagnosis not present

## 2015-11-23 DIAGNOSIS — M5416 Radiculopathy, lumbar region: Secondary | ICD-10-CM | POA: Diagnosis not present

## 2015-11-27 DIAGNOSIS — M5416 Radiculopathy, lumbar region: Secondary | ICD-10-CM | POA: Diagnosis not present

## 2015-11-27 DIAGNOSIS — M9905 Segmental and somatic dysfunction of pelvic region: Secondary | ICD-10-CM | POA: Diagnosis not present

## 2015-11-27 DIAGNOSIS — M9903 Segmental and somatic dysfunction of lumbar region: Secondary | ICD-10-CM | POA: Diagnosis not present

## 2015-11-27 DIAGNOSIS — M5432 Sciatica, left side: Secondary | ICD-10-CM | POA: Diagnosis not present

## 2015-11-29 DIAGNOSIS — M9905 Segmental and somatic dysfunction of pelvic region: Secondary | ICD-10-CM | POA: Diagnosis not present

## 2015-11-29 DIAGNOSIS — M5432 Sciatica, left side: Secondary | ICD-10-CM | POA: Diagnosis not present

## 2015-11-29 DIAGNOSIS — M9903 Segmental and somatic dysfunction of lumbar region: Secondary | ICD-10-CM | POA: Diagnosis not present

## 2015-11-29 DIAGNOSIS — M5416 Radiculopathy, lumbar region: Secondary | ICD-10-CM | POA: Diagnosis not present

## 2015-12-06 DIAGNOSIS — M9903 Segmental and somatic dysfunction of lumbar region: Secondary | ICD-10-CM | POA: Diagnosis not present

## 2015-12-06 DIAGNOSIS — M9905 Segmental and somatic dysfunction of pelvic region: Secondary | ICD-10-CM | POA: Diagnosis not present

## 2015-12-06 DIAGNOSIS — M5432 Sciatica, left side: Secondary | ICD-10-CM | POA: Diagnosis not present

## 2015-12-06 DIAGNOSIS — M5416 Radiculopathy, lumbar region: Secondary | ICD-10-CM | POA: Diagnosis not present

## 2016-01-24 DIAGNOSIS — J029 Acute pharyngitis, unspecified: Secondary | ICD-10-CM | POA: Diagnosis not present

## 2016-01-24 DIAGNOSIS — J069 Acute upper respiratory infection, unspecified: Secondary | ICD-10-CM | POA: Diagnosis not present

## 2016-02-19 DIAGNOSIS — G44209 Tension-type headache, unspecified, not intractable: Secondary | ICD-10-CM | POA: Diagnosis not present

## 2016-02-19 DIAGNOSIS — R200 Anesthesia of skin: Secondary | ICD-10-CM | POA: Diagnosis not present

## 2016-02-19 DIAGNOSIS — R202 Paresthesia of skin: Secondary | ICD-10-CM | POA: Diagnosis not present

## 2016-03-04 DIAGNOSIS — D485 Neoplasm of uncertain behavior of skin: Secondary | ICD-10-CM | POA: Diagnosis not present

## 2016-03-04 DIAGNOSIS — D225 Melanocytic nevi of trunk: Secondary | ICD-10-CM | POA: Diagnosis not present

## 2016-03-04 DIAGNOSIS — L811 Chloasma: Secondary | ICD-10-CM | POA: Diagnosis not present

## 2016-03-04 DIAGNOSIS — L819 Disorder of pigmentation, unspecified: Secondary | ICD-10-CM | POA: Diagnosis not present

## 2016-03-04 DIAGNOSIS — L858 Other specified epidermal thickening: Secondary | ICD-10-CM | POA: Diagnosis not present

## 2016-03-19 DIAGNOSIS — R200 Anesthesia of skin: Secondary | ICD-10-CM | POA: Diagnosis not present

## 2016-03-19 DIAGNOSIS — G44209 Tension-type headache, unspecified, not intractable: Secondary | ICD-10-CM | POA: Diagnosis not present

## 2016-03-19 DIAGNOSIS — E221 Hyperprolactinemia: Secondary | ICD-10-CM | POA: Diagnosis not present

## 2016-03-19 DIAGNOSIS — R202 Paresthesia of skin: Secondary | ICD-10-CM | POA: Diagnosis not present

## 2016-05-09 ENCOUNTER — Emergency Department: Payer: BLUE CROSS/BLUE SHIELD

## 2016-05-09 ENCOUNTER — Encounter: Payer: Self-pay | Admitting: Emergency Medicine

## 2016-05-09 ENCOUNTER — Emergency Department
Admission: EM | Admit: 2016-05-09 | Discharge: 2016-05-09 | Disposition: A | Discharge status: Home / Self Care | Payer: BLUE CROSS/BLUE SHIELD | Attending: Emergency Medicine | Admitting: Emergency Medicine

## 2016-05-09 DIAGNOSIS — W010XXA Fall on same level from slipping, tripping and stumbling without subsequent striking against object, initial encounter: Secondary | ICD-10-CM | POA: Diagnosis not present

## 2016-05-09 DIAGNOSIS — Z79899 Other long term (current) drug therapy: Secondary | ICD-10-CM | POA: Diagnosis not present

## 2016-05-09 DIAGNOSIS — Y999 Unspecified external cause status: Secondary | ICD-10-CM | POA: Diagnosis not present

## 2016-05-09 DIAGNOSIS — S300XXA Contusion of lower back and pelvis, initial encounter: Secondary | ICD-10-CM | POA: Insufficient documentation

## 2016-05-09 DIAGNOSIS — R51 Headache: Secondary | ICD-10-CM | POA: Diagnosis not present

## 2016-05-09 DIAGNOSIS — Y9389 Activity, other specified: Secondary | ICD-10-CM | POA: Insufficient documentation

## 2016-05-09 DIAGNOSIS — Y929 Unspecified place or not applicable: Secondary | ICD-10-CM | POA: Insufficient documentation

## 2016-05-09 DIAGNOSIS — W19XXXA Unspecified fall, initial encounter: Secondary | ICD-10-CM

## 2016-05-09 DIAGNOSIS — M533 Sacrococcygeal disorders, not elsewhere classified: Secondary | ICD-10-CM | POA: Diagnosis not present

## 2016-05-09 DIAGNOSIS — M25552 Pain in left hip: Secondary | ICD-10-CM | POA: Diagnosis not present

## 2016-05-09 DIAGNOSIS — M25551 Pain in right hip: Secondary | ICD-10-CM | POA: Diagnosis not present

## 2016-05-09 DIAGNOSIS — S3992XA Unspecified injury of lower back, initial encounter: Secondary | ICD-10-CM | POA: Diagnosis not present

## 2016-05-09 LAB — URINALYSIS, COMPLETE (UACMP) WITH MICROSCOPIC
BILIRUBIN URINE: NEGATIVE
Bacteria, UA: NONE SEEN
GLUCOSE, UA: NEGATIVE mg/dL
Hgb urine dipstick: NEGATIVE
KETONES UR: NEGATIVE mg/dL
LEUKOCYTES UA: NEGATIVE
Nitrite: NEGATIVE
PH: 7 (ref 5.0–8.0)
Protein, ur: NEGATIVE mg/dL
RBC / HPF: NONE SEEN RBC/hpf (ref 0–5)
SPECIFIC GRAVITY, URINE: 1.019 (ref 1.005–1.030)

## 2016-05-09 LAB — POC URINE PREG, ED: Preg Test, Ur: NEGATIVE

## 2016-05-09 LAB — PREGNANCY, URINE: PREG TEST UR: NEGATIVE

## 2016-05-09 MED ORDER — ONDANSETRON HCL 4 MG/2ML IJ SOLN
4.0000 mg | Freq: Once | INTRAMUSCULAR | Status: AC
  Administered 2016-05-09: 4 mg via INTRAVENOUS
  Filled 2016-05-09: qty 2

## 2016-05-09 MED ORDER — ORPHENADRINE CITRATE 30 MG/ML IJ SOLN
60.0000 mg | Freq: Two times a day (BID) | INTRAMUSCULAR | Status: DC
  Administered 2016-05-09: 60 mg via INTRAVENOUS
  Filled 2016-05-09: qty 2

## 2016-05-09 MED ORDER — KETOROLAC TROMETHAMINE 30 MG/ML IJ SOLN
30.0000 mg | Freq: Once | INTRAMUSCULAR | Status: AC
  Administered 2016-05-09: 30 mg via INTRAVENOUS
  Filled 2016-05-09: qty 1

## 2016-05-09 MED ORDER — CYCLOBENZAPRINE HCL 10 MG PO TABS: 10.0000 mg | ORAL_TABLET | Freq: Three times a day (TID) | ORAL | 0 refills | Status: DC | PRN

## 2016-05-09 MED ORDER — NAPROXEN 500 MG PO TABS: 500.0000 mg | ORAL_TABLET | Freq: Two times a day (BID) | ORAL | 0 refills | Status: DC

## 2016-05-09 NOTE — ED Provider Notes (Signed)
[facility] Emergency Department Provider Note  ____________________________________________  Time seen: Approximately 5:32 PM  I have reviewed the triage vital signs and the nursing notes.   HISTORY  Chief Complaint Fall    HPI Ellen Blankenship is a 35 y.o. female presents to the emergency department with buttock and lower back pain after fall approximately an hour prior to arrival. Patient states she went to pick up her child, stepped on a concrete paver which was wet and slipped. States she went into the air and landed directly on her bottom. Has had severe pain about her tailbone that radiates to the lower back. Has associated fatigue in her lower extremities. Has been able to ambulate but with severe pain. States that the pain is causing nausea. Denies any head injury, LOC, dizziness or lightheadedness. Has had a mild headache but states it is not the worst headache of her life. Denies any visual changes. Has had no chest pain, shortness breath, abdominal pain, vomiting. No changes in urinary or bowel habits. No saddle paresthesias nor loss of bowel or bladder control. Has not noted any open wounds or lacerations. Denies upper extremity pain nor neck pain.   Past Medical History:  Diagnosis Date  . Ischemic colitis ([place])   . Pancreatitis   . Pars defect of lumbar spine     Patient Active Problem List   Diagnosis Date Noted  . Visual field defect 08/03/2015  . History of ischemic bowel disease 08/03/2015  . Pars defect of lumbar spine 08/03/2015  . Postpartum care following vaginal delivery 08/03/2015  . Labor and delivery, indication for care 08/02/2015    Past Surgical History:  Procedure Laterality Date  . ARTHROSCOPIC REPAIR ACL Left   . ELBOW SURGERY    . WISDOM TOOTH EXTRACTION      Prior to Admission medications   Medication Sig Start Date End Date Taking? Authorizing Provider  cyclobenzaprine (FLEXERIL) 10 MG tablet Take 1 tablet (10 mg  total) by mouth 3 (three) times daily as needed for muscle spasms. 05/09/16   Joseguadalupe Stan L Lemya Greenwell, PA-C  ibuprofen (ADVIL,MOTRIN) 600 MG tablet Take 1 tablet (600 mg total) by mouth every 6 (six) hours. 08/05/15   Burlene Arnt, CNM  lidocaine (LIDODERM) 5 % Place 1 patch onto the skin daily. Remove & Discard patch within 12 hours or as directed by MD 08/05/15   Burlene Arnt, CNM  naproxen (NAPROSYN) 500 MG tablet Take 1 tablet (500 mg total) by mouth 2 (two) times daily with a meal. 05/09/16   Jahna Liebert L Lillianne Eick, PA-C  oxyCODONE (ROXICODONE) 5 MG immediate release tablet Take 1 tablet (5 mg total) by mouth every 8 (eight) hours as needed for severe pain. 08/05/15   Burlene Arnt, CNM  oxyCODONE-acetaminophen (PERCOCET/ROXICET) 5-325 MG tablet Take 1-2 tablets by mouth every 4 (four) hours as needed (pain scale 4-7). 08/05/15   Burlene Arnt, CNM    Allergies Sulfa antibiotics  History reviewed. No pertinent family history.  Social History Social History  Substance Use Topics  . Smoking status: Never Smoker  . Smokeless tobacco: Not on file  . Alcohol use No     Review of Systems  Constitutional: No fever/chills Eyes: No visual changes, loss of vision nor floaters in vision.  Cardiovascular: No chest pain. Respiratory: No shortness of breath. No wheezing.  Gastrointestinal: Positive nausea due to pain. No abdominal pain.  No vomiting.   Genitourinary: Negative for dysuria, hematuria. No urinary hesitancy, urgency or increased frequency. Musculoskeletal:  Positive for back, tailbone pain.  Skin: Negative for rash. Neurological: Positive for headache, but no focal weakness or numbness. No saddle paresthesias nor loss of bowel or bladder control. No LOC, dizziness, lightheadedness 10-point ROS otherwise negative.  ____________________________________________   PHYSICAL EXAM:  VITAL SIGNS: ED Triage Vitals [05/09/16 1706]  Enc Vitals Group     BP 130/85     Pulse Rate 86     Resp 18      Temp 97.8 F (36.6 C)     Temp Source Oral     SpO2 99 %     Weight 126 lb (57.2 kg)     Height 5\' 2"  (1.575 m)     Head Circumference      Peak Flow      Pain Score 9     Pain Loc      Pain Edu?      Excl. in [place]?      Constitutional: Alert and oriented. Well appearing and in no acute distress but in pain. Eyes: Conjunctivae are normal without icterus, injection or hemorrhage. PERRLA. EOMI without pain.  Head: Atraumatic. ENT:       Ears:  No discharge from bilateral ears.      Nose: No rhinorrhea or epistaxis.      Mouth/Throat: Mucous membranes are moist.  Neck: Supple with full range of motion. No cervical spine tenderness to palpation. Hematological/Lymphatic/Immunilogical: No cervical lymphadenopathy. Cardiovascular: Normal rate, regular rhythm. Normal S1 and S2.  No murmurs, rubs, gallops. Good peripheral circulation with 2+ pulses noted in bilateral lower extremities. Respiratory: Normal respiratory effort without tachypnea or retractions. Lungs CTAB with breath sounds noted in all lung fields. No wheeze, rhonchi, rales. Musculoskeletal: Full range of motion bilateral lower extremities but pain about bilateral hips with flexion. Strength of bilateral lower extremities is 5 out of 5 and equal. No tenderness to palpation about the thoracic or upper lumbar spine. Tenderness to palpation of the central lower lumbar spine and sacral regions but no step offs or bony abnormality noted. Tenderness to palpation about bilateral inguinal regions again with no crepitus or bony deformities. No lower extremity tenderness nor edema.  No joint effusions. Neurologic:  Normal speech and language. No gross focal neurologic deficits are appreciated. Cranial nerves III through XII grossly intact. Skin:  Soft tissue swelling and blue ecchymosis is noted about the lower lumbar spinal region centrally with pain to palpation of this area. Skin is warm, dry and intact. No rash, redness, abnormal warmth,  open wounds or lacerations noted. Psychiatric: Mood and affect are normal. Speech and behavior are normal. Patient exhibits appropriate insight and judgement.   ____________________________________________   LABS (all labs ordered are listed, but only abnormal results are displayed)  Labs Reviewed  URINALYSIS, COMPLETE (UACMP) WITH MICROSCOPIC - Abnormal; Notable for the following:       Result Value   Color, Urine YELLOW (*)    APPearance CLEAR (*)    Squamous Epithelial / LPF 0-5 (*)    All other components within normal limits  PREGNANCY, URINE  POC URINE PREG, ED   ____________________________________________  EKG  None ____________________________________________  RADIOLOGY I, Judithe Modest Jhonatan Lomeli, personally viewed and evaluated these images (plain radiographs) as part of my medical decision making, as well as reviewing the written report by the radiologist.  Dg Lumbar Spine 2-3 Views  Result Date: 05/09/2016 CLINICAL DATA:  Slip and fall from standing on wet concrete landing on sacrum. Lumbosacral, sacrococcygeal, and bilateral hip/pelvic pain.  EXAM: LUMBAR SPINE - 2-3 VIEW COMPARISON:  CT reformats 06/28/2014 FINDINGS: There are chronic bilateral L5 pars interarticularis defects with 5 mm grade 1 anterolisthesis of L5 on S1. Alignment is otherwise maintained. Vertebral body heights are normal. There is no listhesis. The posterior elements are intact. Disc space narrowing at L5-S1 with preservation of remaining disc spaces. No fracture. Sacroiliac joints are symmetric and normal. IMPRESSION: 1. No acute fracture or subluxation of the lumbar spine. 2. Chronic bilateral L5 pars defects with anterolisthesis. Electronically Signed   By: Jeb Levering M.D.   On: 05/09/2016 18:51   Dg Sacrum/coccyx  Result Date: 05/09/2016 CLINICAL DATA:  Slip and fall from standing on wet concrete landing on sacrum. Lumbosacral, sacrococcygeal, and bilateral hip/pelvic pain. EXAM: SACRUM AND COCCYX -  2+ VIEW COMPARISON:  None. FINDINGS: There is no evidence of fracture or other focal bone lesions. The cortical margins are intact. Sacral ala are preserved. IMPRESSION: Negative radiographs of the sacrum and coccyx. Electronically Signed   By: Jeb Levering M.D.   On: 05/09/2016 18:44   Dg Hips Bilat W Or Wo Pelvis 3-4 Views  Result Date: 05/09/2016 CLINICAL DATA:  Slip and fall from standing on wet concrete landing on sacrum. Lumbosacral, sacrococcygeal, and bilateral hip/pelvic pain. EXAM: DG HIP (WITH OR WITHOUT PELVIS) 3-4V BILAT COMPARISON:  None. FINDINGS: The cortical margins of the bony pelvis are intact. No fracture. Pubic symphysis and sacroiliac joints are congruent. Both femoral heads are well-seated in the respective acetabula. IUD centrally. IMPRESSION: Negative radiographs of the pelvis and hips. Electronically Signed   By: Jeb Levering M.D.   On: 05/09/2016 18:52    ____________________________________________    PROCEDURES  Procedure(s) performed: None   Procedures   Medications  orphenadrine (NORFLEX) injection 60 mg (60 mg Intravenous Given 05/09/16 1758)  ondansetron (ZOFRAN) injection 4 mg (4 mg Intravenous Given 05/09/16 1815)  ketorolac (TORADOL) 30 MG/ML injection 30 mg (30 mg Intravenous Given 05/09/16 1758)     ____________________________________________   INITIAL IMPRESSION / ASSESSMENT AND PLAN / ED COURSE  Pertinent labs & imaging results that were available during my care of the patient were reviewed by me and considered in my medical decision making (see chart for details).  Clinical Course     Patient's diagnosis is consistent with Contusion of lower back and buttock due to fall. Patient will be discharged home with prescriptions for naproxen and Flexeril to take as directed. Patient is to follow up with Dr. Mack Guise in orthopedics if symptoms persist past this treatment course. Patient is given ED precautions to return to the ED for any  worsening or new symptoms.    ____________________________________________  FINAL CLINICAL IMPRESSION(S) / ED DIAGNOSES  Final diagnoses:  Contusion of lower back, initial encounter  Contusion, buttock, initial encounter  Fall, initial encounter      NEW MEDICATIONS STARTED DURING THIS VISIT:  Discharge Medication List as of 05/09/2016  7:11 PM    START taking these medications   Details  cyclobenzaprine (FLEXERIL) 10 MG tablet Take 1 tablet (10 mg total) by mouth 3 (three) times daily as needed for muscle spasms., Starting Fri 05/09/2016, Print    naproxen (NAPROSYN) 500 MG tablet Take 1 tablet (500 mg total) by mouth 2 (two) times daily with a meal., Starting Fri 05/09/2016, Print             Judithe Modest [place], PA-C 05/09/16 1934    Earleen Newport, MD 05/09/16 2126

## 2016-05-09 NOTE — ED Notes (Signed)
Urine pregnancy was discontinued but unable to remove from the chart. Pt did not have lab testing done. POC was completed and resulted negative.

## 2016-05-09 NOTE — ED Triage Notes (Signed)
Pt s/p fall from standing onto concrete. C/o tailbone pain with pain radiating up back. Also tinging bilateral legs. Pt standing in triage crying.

## 2016-05-23 DIAGNOSIS — K51012 Ulcerative (chronic) pancolitis with intestinal obstruction: Secondary | ICD-10-CM | POA: Diagnosis not present

## 2016-09-22 DIAGNOSIS — D352 Benign neoplasm of pituitary gland: Secondary | ICD-10-CM | POA: Diagnosis not present

## 2016-09-22 DIAGNOSIS — G44209 Tension-type headache, unspecified, not intractable: Secondary | ICD-10-CM | POA: Diagnosis not present

## 2016-09-22 DIAGNOSIS — E221 Hyperprolactinemia: Secondary | ICD-10-CM | POA: Diagnosis not present

## 2016-09-22 DIAGNOSIS — R200 Anesthesia of skin: Secondary | ICD-10-CM | POA: Diagnosis not present

## 2016-10-14 DIAGNOSIS — D352 Benign neoplasm of pituitary gland: Secondary | ICD-10-CM | POA: Diagnosis not present

## 2016-11-04 ENCOUNTER — Other Ambulatory Visit: Payer: Self-pay | Admitting: Otolaryngology

## 2016-11-04 DIAGNOSIS — R220 Localized swelling, mass and lump, head: Secondary | ICD-10-CM

## 2016-11-04 DIAGNOSIS — R590 Localized enlarged lymph nodes: Secondary | ICD-10-CM | POA: Diagnosis not present

## 2016-11-10 ENCOUNTER — Ambulatory Visit: Payer: BLUE CROSS/BLUE SHIELD

## 2016-11-12 ENCOUNTER — Encounter: Payer: Self-pay | Admitting: Obstetrics and Gynecology

## 2016-11-12 ENCOUNTER — Ambulatory Visit (INDEPENDENT_AMBULATORY_CARE_PROVIDER_SITE_OTHER): Payer: BLUE CROSS/BLUE SHIELD | Admitting: Obstetrics and Gynecology

## 2016-11-12 VITALS — BP 114/70 | Ht 62.0 in | Wt 133.0 lb

## 2016-11-12 DIAGNOSIS — Z1389 Encounter for screening for other disorder: Secondary | ICD-10-CM | POA: Diagnosis not present

## 2016-11-12 DIAGNOSIS — Z1331 Encounter for screening for depression: Secondary | ICD-10-CM

## 2016-11-12 DIAGNOSIS — Z1339 Encounter for screening examination for other mental health and behavioral disorders: Secondary | ICD-10-CM

## 2016-11-12 DIAGNOSIS — F419 Anxiety disorder, unspecified: Secondary | ICD-10-CM | POA: Diagnosis not present

## 2016-11-12 DIAGNOSIS — Z01419 Encounter for gynecological examination (general) (routine) without abnormal findings: Secondary | ICD-10-CM | POA: Diagnosis not present

## 2016-11-12 NOTE — Progress Notes (Signed)
Gynecology Annual Exam  PCP: Chad Cordial, PA-C  Chief Complaint  Patient presents with  . Annual Exam    History of Present Illness:  [name] is a 36 y.o. [phone] who LMP was No LMP recorded. Patient is not currently having periods (Reason: Lactating)., presents today for her annual examination.    She is single partner, contraception - IUD.  Last Pap: 11/2014, NILM, NPV negative Hx of STDs: none  Last mammogram: n/a There is no FH of breast cancer. There is no FH of ovarian cancer. The patient does not do self-breast exams.  Tobacco use: The patient denies current or previous tobacco use. Alcohol use: none Exercise: moderate amount  The patient wears seatbelts: yes.      The patient notes that she has an increased level of anxiety. It affects her interactions with her husband and with her children.  She does not have episodes of rapid heart rate, difficultly breathing. She is still able to leave the house and perform her normal activities of daily life.    Past Medical History:  Diagnosis Date  . Ischemic colitis ([place])   . Pancreatitis   . Pars defect of lumbar spine     Past Surgical History:  Procedure Laterality Date  . ARTHROSCOPIC REPAIR ACL Left   . ELBOW SURGERY    . WISDOM TOOTH EXTRACTION      Prior to Admission medications   Medication Sig Start Date End Date Taking? Authorizing Provider  cyclobenzaprine (FLEXERIL) 10 MG tablet Take 1 tablet (10 mg total) by mouth 3 (three) times daily as needed for muscle spasms. 05/09/16  Yes Hagler, Jami L, PA-C  nortriptyline (PAMELOR) 10 MG capsule Take 10 mg by mouth at bedtime.   Yes [provider]    Allergies  Allergen Reactions  . Sulfa Antibiotics     Other reaction(s): Other (See Comments) Blisters in eye with sulfa eye drops    Gynecologic History: No LMP recorded. Patient is not currently having periods (Reason: Lactating).  Obstetric History: [id number]  Social History   Social  History  . Marital status: Married    Spouse name: N/A  . Number of children: N/A  . Years of education: N/A   Occupational History  . Not on file.   Social History Main Topics  . Smoking status: Never Smoker  . Smokeless tobacco: Not on file  . Alcohol use No  . Drug use: No  . Sexual activity: No   Other Topics Concern  . Not on file   Social History Narrative  . No narrative on file    Family History  Problem Relation Age of Onset  . Pancreatic cancer Paternal Grandfather   . Colon cancer Paternal Grandfather   . Hypertension Maternal Grandmother   . Heart disease Maternal Grandfather   . Hypertension Maternal Grandfather     Review of Systems  Constitutional: Negative.   HENT: Negative.   Eyes: Negative.   Respiratory: Negative.   Cardiovascular: Negative.   Gastrointestinal: Negative.   Genitourinary: Negative.   Musculoskeletal: Negative.   Skin: Negative.   Neurological: Negative.   Psychiatric/Behavioral: Negative.      Physical Exam BP 114/70   Ht 5\' 2"  (1.575 m)   Wt 133 lb (60.3 kg)   BMI 24.33 kg/m    Physical Exam  Constitutional: She is oriented to person, place, and time. She appears well-developed and well-nourished. No distress.  Genitourinary: Vagina normal and uterus normal. Pelvic exam was  performed with patient supine. There is no rash, tenderness, lesion or injury on the right labia. There is no rash, tenderness, lesion or injury on the left labia. Right adnexum does not display mass, does not display tenderness and does not display fullness. Left adnexum does not display mass, does not display tenderness and does not display fullness.  Cervix exhibits visible IUD strings. Cervix does not exhibit motion tenderness, lesion, discharge, polyp or nabothian cyst.   Uterus is anteverted. Uterus is not enlarged, tender, exhibiting a mass or mobile.  HENT:  Head: Normocephalic and atraumatic.  Eyes: EOM are normal. No scleral icterus.  Neck:  Normal range of motion. Neck supple. No thyromegaly present.  Cardiovascular: Normal rate and regular rhythm.   Pulmonary/Chest: Effort normal and breath sounds normal. No respiratory distress. She has no wheezes. She has no rales.  Abdominal: Soft. Bowel sounds are normal. She exhibits no distension and no mass. There is no tenderness. There is no rebound and no guarding.  Musculoskeletal: Normal range of motion. She exhibits no edema.  Lymphadenopathy:    She has no cervical adenopathy.  Neurological: She is alert and oriented to person, place, and time. No cranial nerve deficit.  Skin: Skin is warm and dry. No erythema.  Psychiatric: She has a normal mood and affect. Her behavior is normal. Judgment normal.   Female chaperone present for pelvic and breast  portions of the physical exam  Results: AUDIT Questionnaire (screen for alcoholism): 2 PHQ-9: 5  Assessment: 36 y.o. G15P3003 female here for routine annual gynecologic examination.  Plan: Problem List Items Addressed This Visit    Anxiety   Relevant Medications   diazepam (VALIUM) 5 MG tablet   nortriptyline (PAMELOR) 10 MG capsule    Other Visit Diagnoses    Women's annual routine gynecological examination    -  Primary   Screening for alcohol problem       Screening for depression         Screening: -- Blood pressure screen normal -- Colonoscopy - per other provider with history of ischemic colitis -- Mammogram - not due -- Weight screening: normal -- Depression screening negative (PHQ-9) -- Nutrition: normal -- cholesterol screening: per PCP -- osteoporosis screening: not due -- tobacco screening: not using -- alcohol screening: AUDIT questionnaire indicates low-risk usage. -- family history of breast cancer screening: done. not at high risk. -- no evidence of domestic violence or intimate partner violence. -- STD screening: gonorrhea/chlamydia NAAT not collected per patient request. -- pap smear not collected  per ASCCP guidelines -- HPV vaccination series: not eligilbe  Anxiety:  Discussed at length. Recommend she seek a counselor, initially. Discussed that therapy in association with medication is optimal treatment usually. I am happy to help with her anxiety care with medication. She declines at this time.  Will continue to monitor.   Prentice Docker, MD 11/14/2016 5:37 PM

## 2016-11-14 ENCOUNTER — Encounter: Payer: Self-pay | Admitting: Obstetrics and Gynecology

## 2016-11-14 DIAGNOSIS — F419 Anxiety disorder, unspecified: Secondary | ICD-10-CM | POA: Insufficient documentation

## 2016-11-23 IMAGING — MR MR HEAD WO/W CM
10 of 18 series · 36 of 48 positions shown · IV contrast (multihance)
Comparison: 08/02/2015

CLINICAL DATA: Hyperprolactinemia. Frontal headache. Visual
disturbance. Recent pregnancy.

EXAM:
MRI HEAD WITHOUT AND WITH CONTRAST
TECHNIQUE: Multiplanar, multiecho pulse sequences of the brain and surrounding
structures were obtained without and with intravenous contrast.
CONTRAST:  6 cc MultiHance

[Series 2: T1 · sagittal · 5.0mm · 0.62mm/px · 2 of 29 slices shown]
[im 1/29]
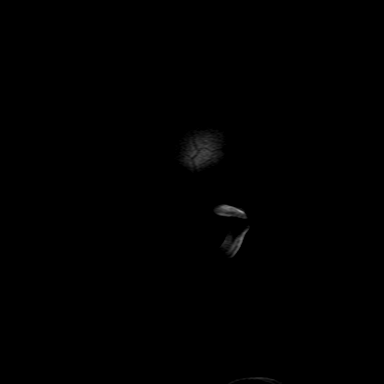
[im 10/29]
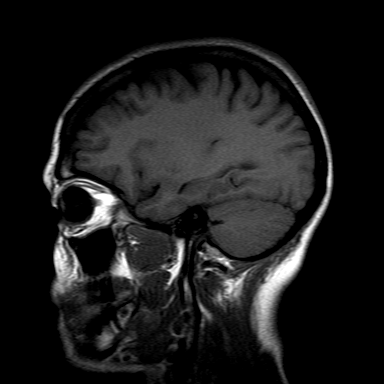

[Series 4: DWI · axial · 4.0mm · 0.94mm/px · z∈[-75,+92]mm · 5 of 43 slices shown (1 of 2)]
[im 1/43]
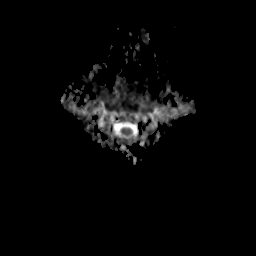
[im 11/43]
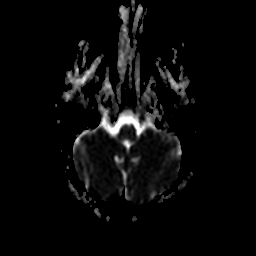
[im 22/43]
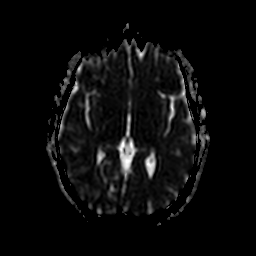
[im 32/43]
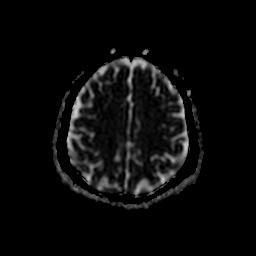
[im 43/43]
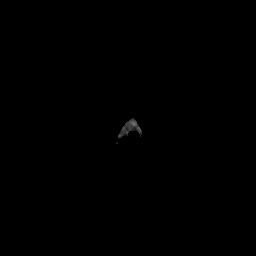

[Series 5: DWI · axial · 4.0mm · 0.94mm/px · z∈[-75,+92]mm · 5 of 43 slices shown (2 of 2)]
[im 1/43]
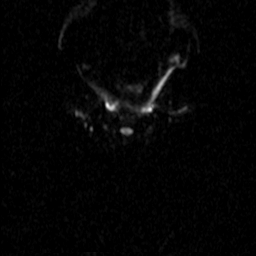
[im 11/43]
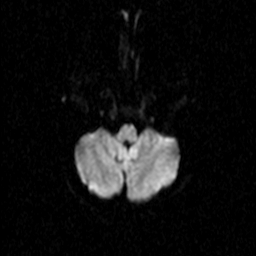
[im 22/43]
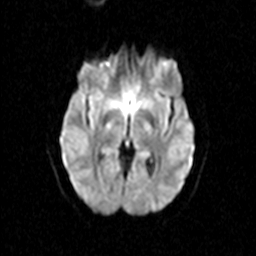
[im 32/43]
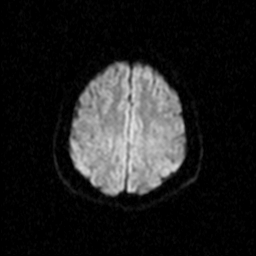
[im 43/43]
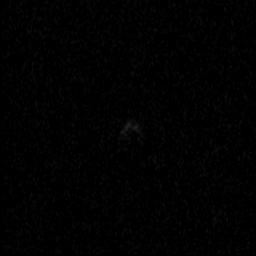

[Series 6: T2 · axial · 5.0mm · 0.57mm/px · z∈[-66,+87]mm · 3 of 23 slices shown (1 of 2)]
[im 1/23]
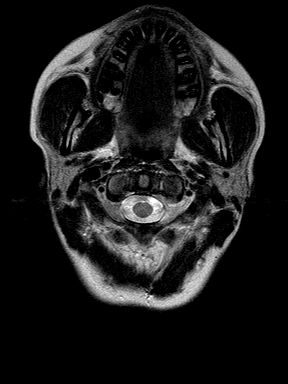
[im 12/23]
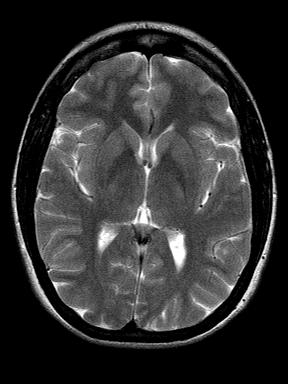
[im 23/23]
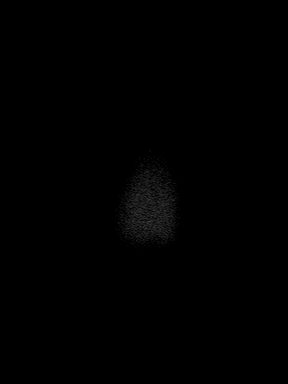

[Series 7: T2 · axial · 5.0mm · 0.57mm/px · z∈[-66,+87]mm · 3 of 23 slices shown (2 of 2)]
[im 1/23]
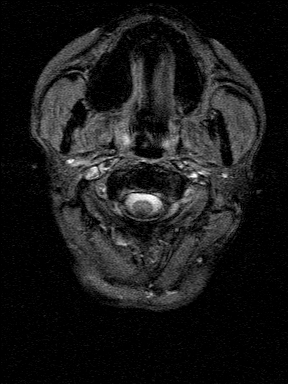
[im 12/23]
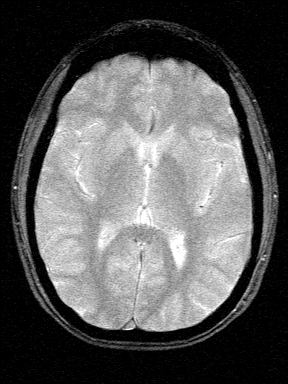
[im 23/23]
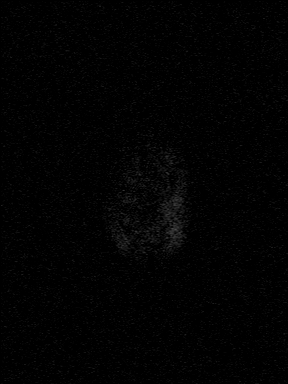

[Series 8: FLAIR · axial · 5.0mm · 0.57mm/px · z∈[-66,+87]mm · 3 of 23 slices shown]
[im 1/23]
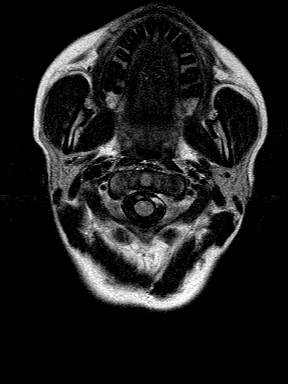
[im 12/23]
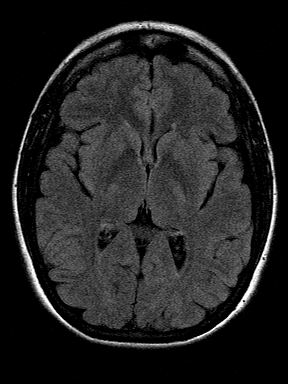
[im 23/23]
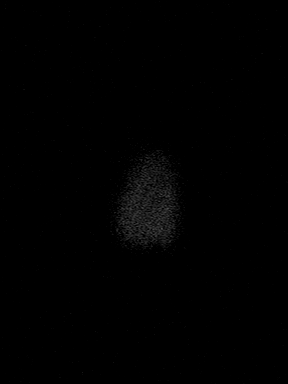

[Series 22: T1 post-contrast · sagittal · 3.0mm · 0.42mm/px · 2 of 15 slices shown (1 of 4)]
[im 1/15]
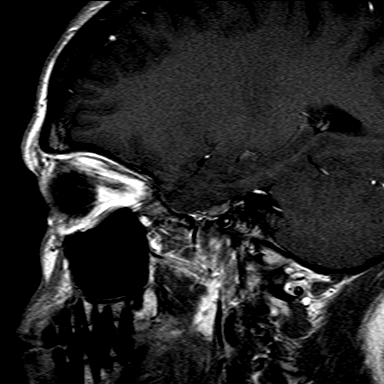
[im 15/15]
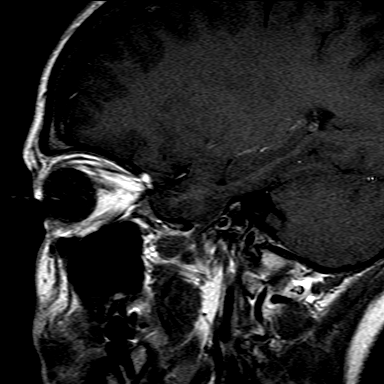

[Series 23: T1 post-contrast · coronal · 3.0mm · 0.42mm/px · 2 of 15 slices shown (2 of 4)]
[im 1/15]
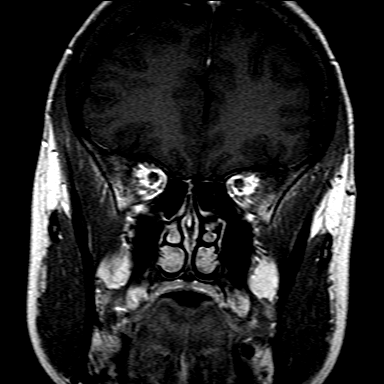
[im 15/15]
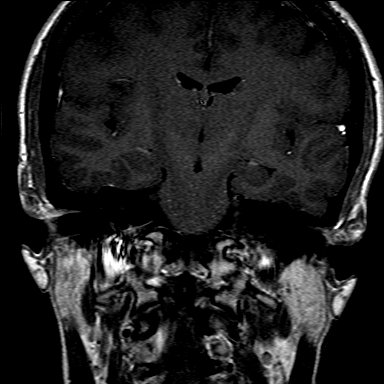

[Series 24: T1 post-contrast · axial · 3.0mm · 0.43mm/px · z∈[-66,+86]mm · 7 of 52 slices shown (3 of 4)]
[im 1/52]
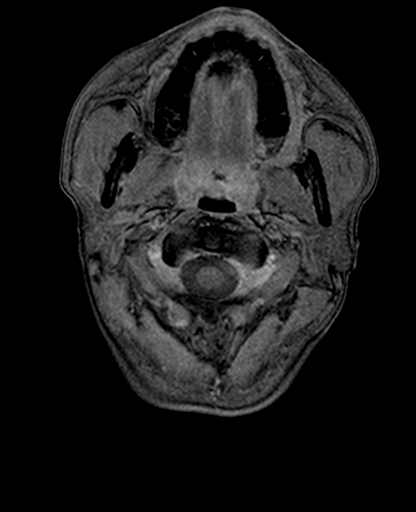
[im 9/52]
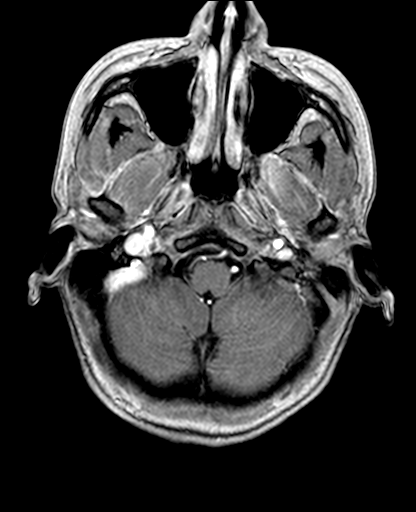
[im 18/52]
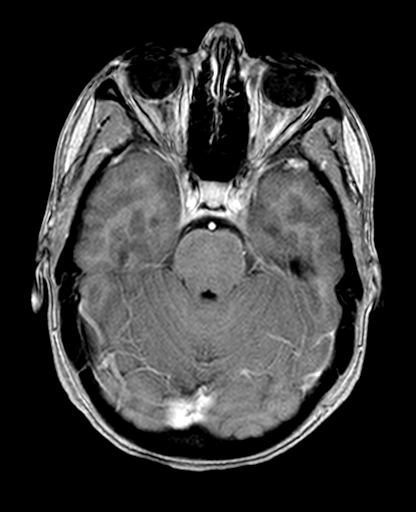
[im 26/52]
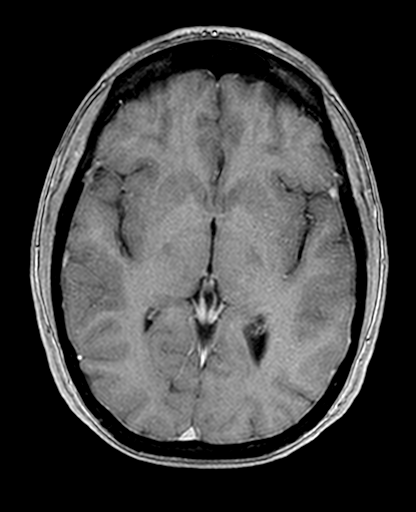
[im 35/52]
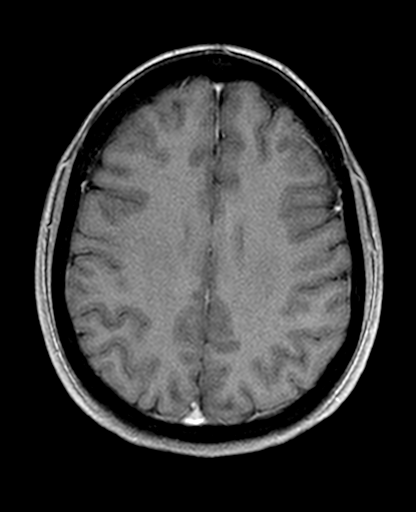
[im 43/52]
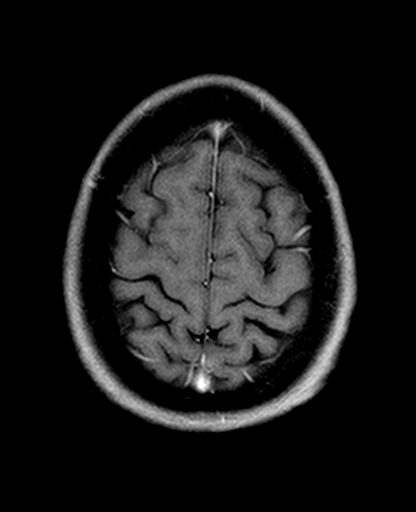
[im 52/52]
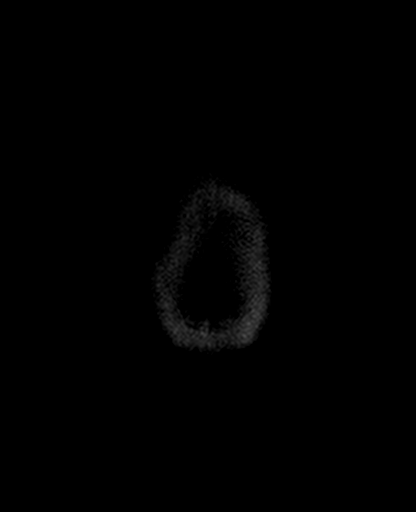

[Series 25: T1 post-contrast · coronal · 5.0mm · 0.43mm/px · 4 of 29 slices shown (4 of 4)]
[im 1/29]
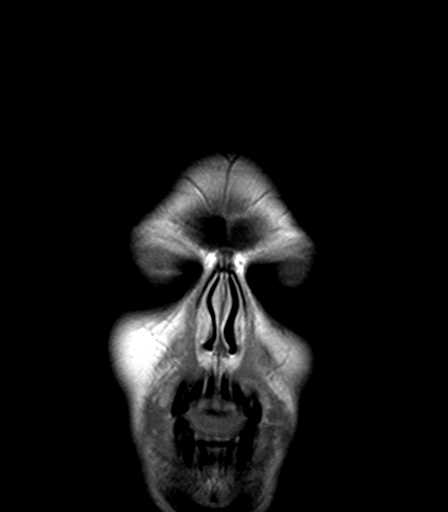
[im 10/29]
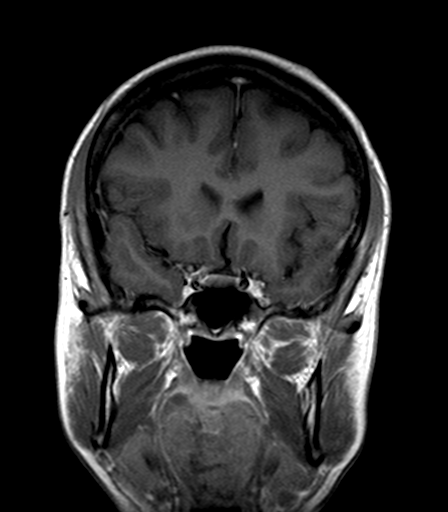
[im 19/29]
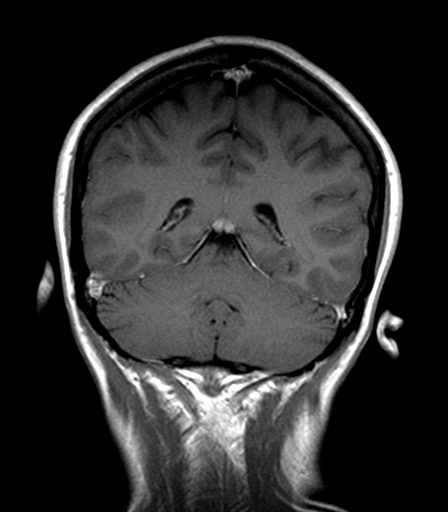
[im 29/29]
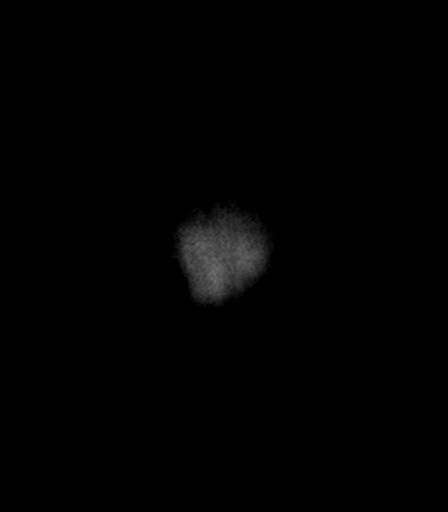

[36 of 48 positions shown; findings below may reference images not displayed]

FINDINGS: The brain has normal appearance without evidence of malformation,
atrophy, old or acute infarction, mass lesion, hemorrhage,
hydrocephalus or extra-axial collection. No inflammatory sinus
disease. Major vessels at the base the brain show flow.

The pituitary gland has normal morphology. Prominence on the
previous study related to a third trimester pregnancy. The gland is
normal in size today. The upper surface is very slightly and
smoothly convex. The infundibulum is midline. After contrast
administration, the gland enhances in a homogeneous fashion. No hypo
enhancing foci. Cavernous sinus regions appear normal. Elsewhere,
the brain and leptomeninges show normal enhancement.
IMPRESSION: Normal examination. Pituitary gland is normal in size, morphology
and enhancement characteristics.

## 2016-12-16 ENCOUNTER — Encounter: Payer: Self-pay | Admitting: Obstetrics and Gynecology

## 2016-12-16 ENCOUNTER — Ambulatory Visit (INDEPENDENT_AMBULATORY_CARE_PROVIDER_SITE_OTHER): Payer: BLUE CROSS/BLUE SHIELD | Admitting: Obstetrics and Gynecology

## 2016-12-16 VITALS — BP 102/62 | HR 93 | Ht 62.0 in | Wt 133.0 lb

## 2016-12-16 DIAGNOSIS — F32A Depression, unspecified: Secondary | ICD-10-CM

## 2016-12-16 DIAGNOSIS — F419 Anxiety disorder, unspecified: Secondary | ICD-10-CM | POA: Diagnosis not present

## 2016-12-16 DIAGNOSIS — F329 Major depressive disorder, single episode, unspecified: Secondary | ICD-10-CM

## 2016-12-16 MED ORDER — SERTRALINE HCL 50 MG PO TABS: ORAL_TABLET | ORAL | 1 refills | Status: DC

## 2016-12-16 NOTE — Progress Notes (Signed)
Chief Complaint  Patient presents with  . Anxiety    delivered 2017    HPI:      [name] is a 36 y.o. [phone] who LMP was No LMP recorded. Patient is not currently having periods (Reason: IUD)., presents today for anxiety and mild depression sx going on since postpartum. She mentioned to Dr. Glennon Mac at her 6/18 annual but didn't want to start meds at that time. She feels sx are worsening and she can't control her emotions. She is interested in meds. She plans to see therapist through employee health. She has worry, troubling sleeping due to worry, anxiety, and bursts of anger over small things. She is yelling at her kids and is upset by this. She is currently breastfeeding, even still at night, so is also fatigued. No SI. She has not done meds in the past.      Patient Active Problem List   Diagnosis Date Noted  . Anxiety and depression 12/16/2016  . Anxiety 11/14/2016  . Visual field defect 08/03/2015  . History of ischemic bowel disease 08/03/2015  . Pars defect of lumbar spine 08/03/2015    Family History  Problem Relation Age of Onset  . Pancreatic cancer Paternal Grandfather   . Colon cancer Paternal Grandfather 50  . Cancer Paternal Grandfather        KIDNEY - UNKNOWN TYPE  . Hypertension Maternal Grandmother   . Heart disease Maternal Grandfather   . Hypertension Maternal Grandfather     Social History   Social History  . Marital status: Married    Spouse name: N/A  . Number of children: 3  . Years of education: N/A   Occupational History  . Not on file.   Social History Main Topics  . Smoking status: Never Smoker  . Smokeless tobacco: Never Used  . Alcohol use No  . Drug use: No  . Sexual activity: Yes    Birth control/ protection: IUD   Other Topics Concern  . Not on file   Social History Narrative  . No narrative on file     Current Outpatient Prescriptions:  .  cyclobenzaprine (FLEXERIL) 10 MG tablet, Take 1 tablet (10 mg total) by  mouth 3 (three) times daily as needed for muscle spasms., Disp: 21 tablet, Rfl: 0 .  levonorgestrel (MIRENA) 20 MCG/24HR IUD, 1 each by Intrauterine route once., Disp: , Rfl:  .  nortriptyline (PAMELOR) 10 MG capsule, Take 30 mg at night., Disp: , Rfl:  .  sertraline (ZOLOFT) 50 MG tablet, Take 1/2 tab daily for 6 days, then 1 tab daily, Disp: 30 tablet, Rfl: 1  Review of Systems  Constitutional: Negative for fever.  Gastrointestinal: Negative for blood in stool, constipation, diarrhea, nausea and vomiting.  Genitourinary: Negative for dyspareunia, dysuria, flank pain, frequency, hematuria, urgency, vaginal bleeding, vaginal discharge and vaginal pain.  Musculoskeletal: Negative for back pain.  Skin: Negative for rash.  Psychiatric/Behavioral: Positive for agitation. Negative for behavioral problems, confusion, dysphoric mood and suicidal ideas.     OBJECTIVE:   Vitals:  BP 102/62 (BP Location: Left Arm, Patient Position: Sitting, Cuff Size: Normal)   Pulse 93   Ht 5\' 2"  (1.575 m)   Wt 133 lb (60.3 kg)   Breastfeeding? Yes   BMI 24.33 kg/m   Physical Exam  Constitutional: She is oriented to person, place, and time and well-developed, well-nourished, and in no distress.  Neurological: She is alert and oriented to person, place, and time.  Psychiatric:  Mood, memory, affect and judgment normal.    Results: GAD-7=18 PHQ-9=11  Assessment/Plan: Anxiety and depression - Pt wants to start meds. Rx zoloft. Still breastfeeding. RTO in 7 wks for f/u/sooner prn. Recommended seeing therapist too. - Plan: sertraline (ZOLOFT) 50 MG tablet   Meds ordered this encounter  Medications  . sertraline (ZOLOFT) 50 MG tablet    Sig: Take 1/2 tab daily for 6 days, then 1 tab daily    Dispense:  30 tablet    Refill:  1      Return in about 7 weeks (around 02/03/2017), or if symptoms worsen or fail to improve, for anxiety f/u.  Thecla Forgione B. Renna Kilmer, PA-C 12/16/2016 12:10 PM

## 2017-02-12 ENCOUNTER — Other Ambulatory Visit: Payer: Self-pay | Admitting: Obstetrics and Gynecology

## 2017-02-12 DIAGNOSIS — F329 Major depressive disorder, single episode, unspecified: Secondary | ICD-10-CM

## 2017-02-12 DIAGNOSIS — F32A Depression, unspecified: Secondary | ICD-10-CM

## 2017-02-12 DIAGNOSIS — F419 Anxiety disorder, unspecified: Principal | ICD-10-CM

## 2017-02-12 MED ORDER — SERTRALINE HCL 50 MG PO TABS: ORAL_TABLET | ORAL | 0 refills | Status: DC

## 2017-02-12 NOTE — Telephone Encounter (Signed)
Please advise for refill. Thank you.  

## 2017-02-16 ENCOUNTER — Telehealth: Payer: Self-pay

## 2017-02-16 NOTE — Telephone Encounter (Signed)
Pt called after hour nurse 02/12/17 at 5:18pm for refill on zoloft, it's working fine.  Pt will call to sched appt.  Msg given to [facility] to sched. Appt.  Refill given by [facility] for 30d supply.

## 2017-02-24 ENCOUNTER — Encounter: Payer: Self-pay | Admitting: Obstetrics and Gynecology

## 2017-02-24 ENCOUNTER — Ambulatory Visit (INDEPENDENT_AMBULATORY_CARE_PROVIDER_SITE_OTHER): Payer: BLUE CROSS/BLUE SHIELD | Admitting: Obstetrics and Gynecology

## 2017-02-24 DIAGNOSIS — F419 Anxiety disorder, unspecified: Secondary | ICD-10-CM

## 2017-02-24 DIAGNOSIS — F329 Major depressive disorder, single episode, unspecified: Secondary | ICD-10-CM | POA: Diagnosis not present

## 2017-02-24 DIAGNOSIS — F32A Depression, unspecified: Secondary | ICD-10-CM

## 2017-02-24 MED ORDER — SERTRALINE HCL 100 MG PO TABS: ORAL_TABLET | ORAL | 0 refills | Status: DC

## 2017-02-24 NOTE — Progress Notes (Signed)
Chief Complaint  Patient presents with  . Follow-up    medication follow up    HPI:      [name] is a 36 y.o. [phone] who LMP was No LMP recorded. Patient is not currently having periods (Reason: IUD)., presents today for f/u on anxiety and depression from 12/16/16. She was started on zoloft 50 mg. She noticed an improvement in anxiety/anger/worry initially but sx have since worsened, especially the last few wks. She just feels out of control with anxiety/worry/anger. She has trouble sleeping due to worry. She is still breastfeeding and is exercising. No side effects with zoloft except for a little fatigue. No SI/no concern for physical abuse.    Past Medical History:  Diagnosis Date  . History of Papanicolaou smear of cervix 06/16/2011   -/- NEG  . Ischemic colitis ([place])    HOSPITALIZED 06/2014  . Pancreatitis    POSS D/T WATER CONTAMINATION. PT HAS NOT HAD SXS SINCE CHANGING TO FILTERED/BOTTLED WATER  . Pars defect of lumbar spine     Past Surgical History:  Procedure Laterality Date  . ARTHROSCOPIC REPAIR ACL Left   . ELBOW SURGERY     LEFT  . WISDOM TOOTH EXTRACTION      Family History  Problem Relation Age of Onset  . Pancreatic cancer Paternal Grandfather   . Colon cancer Paternal Grandfather 53  . Cancer Paternal Grandfather        KIDNEY - UNKNOWN TYPE  . Hypertension Maternal Grandmother   . Heart disease Maternal Grandfather   . Hypertension Maternal Grandfather     Social History   Social History  . Marital status: Married    Spouse name: N/A  . Number of children: 3  . Years of education: N/A   Occupational History  . Not on file.   Social History Main Topics  . Smoking status: Never Smoker  . Smokeless tobacco: Never Used  . Alcohol use No  . Drug use: No  . Sexual activity: Yes    Birth control/ protection: IUD   Other Topics Concern  . Not on file   Social History Narrative  . No narrative on file     Current Outpatient  Prescriptions:  .  cyclobenzaprine (FLEXERIL) 10 MG tablet, Take 1 tablet (10 mg total) by mouth 3 (three) times daily as needed for muscle spasms., Disp: 21 tablet, Rfl: 0 .  levonorgestrel (MIRENA) 20 MCG/24HR IUD, 1 each by Intrauterine route once., Disp: , Rfl:  .  nortriptyline (PAMELOR) 10 MG capsule, Take 30 mg at night., Disp: , Rfl:  .  sertraline (ZOLOFT) 100 MG tablet, Take 1 tab PO Daily, Disp: 30 tablet, Rfl: 0   ROS:  Review of Systems  Constitutional: Positive for fatigue. Negative for fever.  Gastrointestinal: Negative for blood in stool, constipation, diarrhea, nausea and vomiting.  Genitourinary: Negative for dyspareunia, dysuria, flank pain, frequency, hematuria, urgency, vaginal bleeding, vaginal discharge and vaginal pain.  Musculoskeletal: Negative for back pain.  Skin: Negative for rash.  Psychiatric/Behavioral: Positive for agitation, dysphoric mood and sleep disturbance. Negative for confusion, hallucinations, self-injury and suicidal ideas.     OBJECTIVE:   Vitals:  BP 102/62   Pulse 84   Ht 5\' 2"  (1.575 m)   Wt 134 lb (60.8 kg)   BMI 24.51 kg/m   Physical Exam  Constitutional: She is oriented to person, place, and time and well-developed, well-nourished, and in no distress.  Neurological: She is alert and oriented to  person, place, and time. Gait normal.  Psychiatric: Mood, memory, affect and judgment normal.  Vitals reviewed.   Results: GAD-7=21 (WAS 18 7/18) PHQ-9=9 (WAS 11 7/18)  Assessment/Plan: Anxiety and depression - Sx not controlled with zoloft 50mg  daily. Will increase to 100 mg. Rx eRxd. RTO in 4 wks for f/u/sooner prn. Pt is breastfeeding.  - Plan: sertraline (ZOLOFT) 100 MG tablet   Meds ordered this encounter  Medications  . sertraline (ZOLOFT) 100 MG tablet    Sig: Take 1 tab PO Daily    Dispense:  30 tablet    Refill:  0      Return in about 4 weeks (around 03/24/2017) for anxiety f/u.  Nonie Lochner B. Caitlen Worth,  PA-C 02/24/2017 11:40 AM

## 2017-03-05 DIAGNOSIS — Z23 Encounter for immunization: Secondary | ICD-10-CM | POA: Diagnosis not present

## 2017-03-24 ENCOUNTER — Ambulatory Visit (INDEPENDENT_AMBULATORY_CARE_PROVIDER_SITE_OTHER): Payer: BLUE CROSS/BLUE SHIELD | Admitting: Obstetrics and Gynecology

## 2017-03-24 ENCOUNTER — Encounter: Payer: Self-pay | Admitting: Obstetrics and Gynecology

## 2017-03-24 VITALS — BP 110/70 | HR 77 | Ht 62.0 in | Wt 132.0 lb

## 2017-03-24 DIAGNOSIS — F419 Anxiety disorder, unspecified: Secondary | ICD-10-CM

## 2017-03-24 DIAGNOSIS — G4709 Other insomnia: Secondary | ICD-10-CM

## 2017-03-24 DIAGNOSIS — F32A Depression, unspecified: Secondary | ICD-10-CM

## 2017-03-24 DIAGNOSIS — F329 Major depressive disorder, single episode, unspecified: Secondary | ICD-10-CM

## 2017-03-24 MED ORDER — SERTRALINE HCL 100 MG PO TABS
ORAL_TABLET | ORAL | 8 refills | Status: DC
Start: 2017-03-24 — End: 2017-04-16

## 2017-03-24 NOTE — Progress Notes (Signed)
Chief Complaint  Patient presents with  . Anxiety    HPI:      [name] is a 36 y.o. [phone] who LMP was No LMP recorded. Patient is not currently having periods (Reason: IUD)., presents today for anxiety/depression f/u.   She was started on zoloft 50 mg 7/18 for sx. She noticed an improvement in anxiety/anger/worry initially but sx  Worsened again. She would feel out of control with anxiety/worry/anger and have anger outbreaks with her kids over small things. She had trouble sleeping due to worry.  Zoloft increased to 100 mg dose 9/18. Pt states sx are improved, particularly anger/feeling out of control. She still has trouble falling asleep sometimes due to worry/thinking of next day. No side effects with zoloft.   She is still breastfeeding and is exercising. No  SI/no concern for physical abuse.  9/18  GAD-7=21 (WAS 18 7/18); PHQ-9=9 (WAS 11 7/18)  Past Medical History:  Diagnosis Date  . History of Papanicolaou smear of cervix 06/16/2011   -/- NEG  . Ischemic colitis ([place])    HOSPITALIZED 06/2014  . Pancreatitis    POSS D/T WATER CONTAMINATION. PT HAS NOT HAD SXS SINCE CHANGING TO FILTERED/BOTTLED WATER  . Pars defect of lumbar spine     Past Surgical History:  Procedure Laterality Date  . ARTHROSCOPIC REPAIR ACL Left   . ELBOW SURGERY     LEFT  . WISDOM TOOTH EXTRACTION      Family History  Problem Relation Age of Onset  . Pancreatic cancer Paternal Grandfather   . Colon cancer Paternal Grandfather 54  . Cancer Paternal Grandfather        KIDNEY - UNKNOWN TYPE  . Hypertension Maternal Grandmother   . Heart disease Maternal Grandfather   . Hypertension Maternal Grandfather     Social History   Social History  . Marital status: Married    Spouse name: N/A  . Number of children: 3  . Years of education: N/A   Occupational History  . Not on file.   Social History Main Topics  . Smoking status: Never Smoker  . Smokeless tobacco: Never Used  .  Alcohol use No  . Drug use: No  . Sexual activity: Yes    Birth control/ protection: IUD   Other Topics Concern  . Not on file   Social History Narrative  . No narrative on file     Current Outpatient Prescriptions:  .  cyclobenzaprine (FLEXERIL) 10 MG tablet, Take 1 tablet (10 mg total) by mouth 3 (three) times daily as needed for muscle spasms., Disp: 21 tablet, Rfl: 0 .  levonorgestrel (MIRENA) 20 MCG/24HR IUD, 1 each by Intrauterine route once., Disp: , Rfl:  .  nortriptyline (PAMELOR) 10 MG capsule, Take 30 mg at night., Disp: , Rfl:  .  sertraline (ZOLOFT) 100 MG tablet, Take 1 tab PO Daily, Disp: 30 tablet, Rfl: 8   ROS:  Review of Systems  Constitutional: Negative for fever.  Gastrointestinal: Positive for constipation. Negative for blood in stool, diarrhea, nausea and vomiting.  Genitourinary: Negative for dyspareunia, dysuria, flank pain, frequency, hematuria, urgency, vaginal bleeding, vaginal discharge and vaginal pain.  Musculoskeletal: Negative for back pain.  Skin: Negative for rash.  Psychiatric/Behavioral: Positive for agitation, dysphoric mood and sleep disturbance. Negative for hallucinations, self-injury and suicidal ideas.     OBJECTIVE:   Vitals:  BP 110/70   Pulse 77   Ht 5\' 2"  (1.575 m)   Wt 132 lb (59.9  kg)   BMI 24.14 kg/m   Physical Exam  Constitutional: She is oriented to person, place, and time and well-developed, well-nourished, and in no distress.  Neurological: She is alert and oriented to person, place, and time.  Psychiatric: Memory, affect and judgment normal.    Results: GAD-7=9 (WAS 21 9/18) PHQ-9=6 (WAS 9 9/18)  Assessment/Plan: Anxiety and depression - Sx much improved with zoloft 100 mg. No side effects. Doing well. Rx RF. F/u prn. - Plan: sertraline (ZOLOFT) 100 MG tablet  Other insomnia - Discussed sleep habits. Can try med prn but pt is still breastfeeding. F/u prn.     Meds ordered this encounter  Medications  .  sertraline (ZOLOFT) 100 MG tablet    Sig: Take 1 tab PO Daily    Dispense:  30 tablet    Refill:  8      Return if symptoms worsen or fail to improve.  Alicia B. Copland, PA-C 03/24/2017 11:19 AM

## 2017-04-16 ENCOUNTER — Other Ambulatory Visit: Payer: Self-pay | Admitting: Obstetrics and Gynecology

## 2017-04-16 DIAGNOSIS — F329 Major depressive disorder, single episode, unspecified: Secondary | ICD-10-CM

## 2017-04-16 DIAGNOSIS — F32A Depression, unspecified: Secondary | ICD-10-CM

## 2017-04-16 DIAGNOSIS — F419 Anxiety disorder, unspecified: Principal | ICD-10-CM

## 2017-04-16 MED ORDER — SERTRALINE HCL 100 MG PO TABS
ORAL_TABLET | ORAL | 2 refills | Status: DC
Start: 2017-04-16 — End: 2017-11-09

## 2017-04-16 NOTE — Progress Notes (Signed)
Rx change to 90 day supply

## 2017-05-18 DIAGNOSIS — J029 Acute pharyngitis, unspecified: Secondary | ICD-10-CM | POA: Diagnosis not present

## 2017-05-18 DIAGNOSIS — J019 Acute sinusitis, unspecified: Secondary | ICD-10-CM | POA: Diagnosis not present

## 2017-05-18 DIAGNOSIS — B9689 Other specified bacterial agents as the cause of diseases classified elsewhere: Secondary | ICD-10-CM | POA: Diagnosis not present

## 2017-06-17 ENCOUNTER — Encounter: Payer: Self-pay | Admitting: Obstetrics and Gynecology

## 2017-06-17 ENCOUNTER — Other Ambulatory Visit: Payer: Self-pay | Admitting: Obstetrics and Gynecology

## 2017-06-17 MED ORDER — ZOLPIDEM TARTRATE 5 MG PO TABS: 5.0000 mg | ORAL_TABLET | Freq: Every evening | ORAL | 0 refills | Status: DC | PRN

## 2017-06-17 NOTE — Progress Notes (Signed)
Rx ambien for sleep since husband in hospital and pt has kids.

## 2017-06-26 DIAGNOSIS — Z6824 Body mass index (BMI) 24.0-24.9, adult: Secondary | ICD-10-CM | POA: Diagnosis not present

## 2017-06-26 DIAGNOSIS — G43109 Migraine with aura, not intractable, without status migrainosus: Secondary | ICD-10-CM | POA: Diagnosis not present

## 2017-06-26 DIAGNOSIS — F419 Anxiety disorder, unspecified: Secondary | ICD-10-CM | POA: Diagnosis not present

## 2017-10-09 DIAGNOSIS — S76012A Strain of muscle, fascia and tendon of left hip, initial encounter: Secondary | ICD-10-CM | POA: Diagnosis not present

## 2017-10-09 DIAGNOSIS — M7062 Trochanteric bursitis, left hip: Secondary | ICD-10-CM | POA: Diagnosis not present

## 2017-11-09 ENCOUNTER — Ambulatory Visit (INDEPENDENT_AMBULATORY_CARE_PROVIDER_SITE_OTHER): Payer: BLUE CROSS/BLUE SHIELD | Admitting: Obstetrics and Gynecology

## 2017-11-09 ENCOUNTER — Encounter: Payer: Self-pay | Admitting: Obstetrics and Gynecology

## 2017-11-09 VITALS — BP 114/72 | HR 87 | Ht 62.0 in | Wt 132.0 lb

## 2017-11-09 DIAGNOSIS — F32A Depression, unspecified: Secondary | ICD-10-CM

## 2017-11-09 DIAGNOSIS — F329 Major depressive disorder, single episode, unspecified: Secondary | ICD-10-CM | POA: Diagnosis not present

## 2017-11-09 DIAGNOSIS — M940 Chondrocostal junction syndrome [Tietze]: Secondary | ICD-10-CM

## 2017-11-09 DIAGNOSIS — F419 Anxiety disorder, unspecified: Secondary | ICD-10-CM | POA: Diagnosis not present

## 2017-11-09 DIAGNOSIS — Z0000 Encounter for general adult medical examination without abnormal findings: Secondary | ICD-10-CM

## 2017-11-09 MED ORDER — PAROXETINE HCL 10 MG PO TABS: ORAL_TABLET | ORAL | 1 refills | Status: DC

## 2017-11-09 NOTE — Progress Notes (Signed)
Jermika Olden, [name], PA-C   Chief Complaint  Patient presents with  . Breast Problem    Lump in left breast, painful xfew months but worse sense this weekend   . Anxiety    Talk about anxiety     HPI:      [name] is a 37 y.o. [phone] who LMP was No LMP recorded. (Menstrual status: IUD)., presents today for left breast pain and lump for the past few days. Pt had achy pains on LT chest wall that felt like someone was pushing on her chest. Sx improved with NSAIDs. No recent trauma, heavy lifting, exercise change. Mother-in-law noticed mass from side view of pt's chest wall. Pain is slightly improved today. No FH breast/ovar cancer. No mammo hx. Pt thinks she had similar sx in past that resolved.   Pt also with hx of anxiety/depression. Was on sertraline 100 mg with sx control but pt stopped it 2/19 because she felt so groggy/tired in the mornings. She tried adjusting time of dose but it made her too tired. She has noted increased anxiety/panicky sx for the past month. Depression sx restarting too. Pt is exercising, doing yoga, seeing therapist without sx control. Is having trouble sleeping due to worry.   Due for annual 6/19.    Past Medical History:  Diagnosis Date  . History of Papanicolaou smear of cervix 06/16/2011   -/- NEG  . Ischemic colitis ([place])    HOSPITALIZED 06/2014  . Pancreatitis    POSS D/T WATER CONTAMINATION. PT HAS NOT HAD SXS SINCE CHANGING TO FILTERED/BOTTLED WATER  . Pars defect of lumbar spine     Past Surgical History:  Procedure Laterality Date  . ARTHROSCOPIC REPAIR ACL Left   . ELBOW SURGERY     LEFT  . WISDOM TOOTH EXTRACTION      Family History  Problem Relation Age of Onset  . Pancreatic cancer Paternal Grandfather   . Colon cancer Paternal Grandfather 64  . Cancer Paternal Grandfather        KIDNEY - UNKNOWN TYPE  . Hypertension Maternal Grandmother   . Heart disease Maternal Grandfather   . Hypertension Maternal Grandfather      Social History   Socioeconomic History  . Marital status: Married    Spouse name: Not on file  . Number of children: 3  . Years of education: Not on file  . Highest education level: Not on file  Occupational History  . Not on file  Social Needs  . Financial resource strain: Not on file  . Food insecurity:    Worry: Not on file    Inability: Not on file  . Transportation needs:    Medical: Not on file    Non-medical: Not on file  Tobacco Use  . Smoking status: Never Smoker  . Smokeless tobacco: Never Used  Substance and Sexual Activity  . Alcohol use: No    Alcohol/week: 0.0 oz  . Drug use: No  . Sexual activity: Yes    Birth control/protection: IUD    Comment: Mirena   Lifestyle  . Physical activity:    Days per week: Not on file    Minutes per session: Not on file  . Stress: Not on file  Relationships  . Social connections:    Talks on phone: Not on file    Gets together: Not on file    Attends religious service: Not on file    Active member of club or organization: Not  on file    Attends meetings of clubs or organizations: Not on file    Relationship status: Not on file  . Intimate partner violence:    Fear of current or ex partner: Not on file    Emotionally abused: Not on file    Physically abused: Not on file    Forced sexual activity: Not on file  Other Topics Concern  . Not on file  Social History Narrative  . Not on file    Outpatient Medications Prior to Visit  Medication Sig Dispense Refill  . levonorgestrel (MIRENA) 20 MCG/24HR IUD 1 each by Intrauterine route once.    . cyclobenzaprine (FLEXERIL) 10 MG tablet Take 1 tablet (10 mg total) by mouth 3 (three) times daily as needed for muscle spasms. (Patient not taking: Reported on 11/09/2017) 21 tablet 0  . SUMAtriptan (IMITREX) 50 MG tablet Take by mouth.    . zolpidem (AMBIEN) 5 MG tablet Take 1 tablet (5 mg total) by mouth at bedtime as needed for sleep. (Patient not taking: Reported on  11/09/2017) 30 tablet 0  . nortriptyline (PAMELOR) 10 MG capsule Take 30 mg at night.    . sertraline (ZOLOFT) 100 MG tablet Take 1 tab PO Daily 90 tablet 2   No facility-administered medications prior to visit.       ROS:  Review of Systems  Constitutional: Negative for fatigue, fever and unexpected weight change.  Respiratory: Negative for cough, shortness of breath and wheezing.   Cardiovascular: Positive for chest pain. Negative for palpitations and leg swelling.  Gastrointestinal: Negative for blood in stool, constipation, diarrhea, nausea and vomiting.  Endocrine: Negative for cold intolerance, heat intolerance and polyuria.  Genitourinary: Negative for dyspareunia, dysuria, flank pain, frequency, genital sores, hematuria, menstrual problem, pelvic pain, urgency, vaginal bleeding, vaginal discharge and vaginal pain.  Musculoskeletal: Negative for back pain, joint swelling and myalgias.  Skin: Negative for rash.  Neurological: Negative for dizziness, syncope, light-headedness, numbness and headaches.  Hematological: Negative for adenopathy.  Psychiatric/Behavioral: Positive for agitation. Negative for confusion, sleep disturbance and suicidal ideas. The patient is nervous/anxious.   BREAST: positive for lumps, pain   OBJECTIVE:   Vitals:  BP 114/72   Pulse 87   Ht 5\' 2"  (1.575 m)   Wt 132 lb (59.9 kg)   Breastfeeding? No   BMI 24.14 kg/m   Physical Exam  Constitutional: She is oriented to person, place, and time. She appears well-developed.  Neck: Normal range of motion.  Pulmonary/Chest: Effort normal. She exhibits tenderness. She exhibits no mass, no crepitus, no deformity and no swelling. Right breast exhibits no inverted nipple, no mass, no nipple discharge, no skin change and no tenderness. Left breast exhibits no inverted nipple, no mass, no nipple discharge, no skin change and no tenderness. Breasts are symmetrical.  TENDER RIBS LT ANT CHEST/STERNUM; NO BREAST  MASSES BILAT; LT CHEST WALL IS MORE PROMINENT THAN RIGHT FROM ANT AND SIDE VIEWS    Musculoskeletal: Normal range of motion.  Lymphadenopathy:    She has no axillary adenopathy.  Neurological: She is alert and oriented to person, place, and time. No cranial nerve deficit.  Psychiatric: She has a normal mood and affect. Her behavior is normal. Judgment and thought content normal.  Vitals reviewed.   Results: GAD 7 : Generalized Anxiety Score 11/09/2017  Nervous, Anxious, on Edge 3  Control/stop worrying 3  Worry too much - different things 3  Trouble relaxing 3  Restless 3  Easily annoyed or irritable  2  Afraid - awful might happen 3  Total GAD 7 Score 20  Anxiety Difficulty Somewhat difficult   Depression screen [facility] 2/9 11/09/2017  Decreased Interest 0  Down, Depressed, Hopeless 0  PHQ - 2 Score 0  Altered sleeping 3  Tired, decreased energy 2  Change in appetite 2  Feeling bad or failure about yourself  3  Trouble concentrating 3  Moving slowly or fidgety/restless 2  Suicidal thoughts 0  PHQ-9 Score 15  Difficult doing work/chores Somewhat difficult    Assessment/Plan: Anxiety and depression - Discussed need for SSRI. Pt willing to restart. Had sx control with zoloft but fatigue SE. Try paxil. Rx eRxd. RTO in 6 wks for annual/sx f/u. Sooner prn.  - Plan: PARoxetine (PAXIL) 10 MG tablet  Costochondritis - NSAIDs. Reassurance re: neg breast exam. F/u in 6 wks at annual/sooner prn.   Normal breast exam    Meds ordered this encounter  Medications  . PARoxetine (PAXIL) 10 MG tablet    Sig: Take 1/2 tab daily for 6 days, then 1 tab daily    Dispense:  30 tablet    Refill:  1    Order Specific Question:   Supervising Provider    Answer:   Gae Dry [[id number]]      Return in about 6 weeks (around 12/21/2017) for annual/anxiety f/u.  Rafel Garde B. Keymoni Mccaster, PA-C 11/09/2017 5:04 PM

## 2017-11-09 NOTE — Patient Instructions (Signed)
I value your feedback and entrusting us with your care. If you get a [facility] patient survey, I would appreciate you taking the time to let us know about your experience today. Thank you! 

## 2017-11-25 ENCOUNTER — Other Ambulatory Visit: Payer: Self-pay | Admitting: Obstetrics and Gynecology

## 2017-11-25 DIAGNOSIS — F419 Anxiety disorder, unspecified: Principal | ICD-10-CM

## 2017-11-25 DIAGNOSIS — F32A Depression, unspecified: Secondary | ICD-10-CM

## 2017-11-25 DIAGNOSIS — F329 Major depressive disorder, single episode, unspecified: Secondary | ICD-10-CM

## 2017-11-26 NOTE — Telephone Encounter (Signed)
Please advise 

## 2017-11-27 ENCOUNTER — Other Ambulatory Visit: Payer: Self-pay | Admitting: Obstetrics and Gynecology

## 2017-11-27 DIAGNOSIS — F329 Major depressive disorder, single episode, unspecified: Secondary | ICD-10-CM

## 2017-11-27 DIAGNOSIS — F419 Anxiety disorder, unspecified: Principal | ICD-10-CM

## 2017-11-27 DIAGNOSIS — F32A Depression, unspecified: Secondary | ICD-10-CM

## 2017-11-27 NOTE — Telephone Encounter (Signed)
Please advise 

## 2017-12-14 ENCOUNTER — Ambulatory Visit: Payer: BLUE CROSS/BLUE SHIELD | Admitting: Obstetrics and Gynecology

## 2017-12-15 ENCOUNTER — Other Ambulatory Visit: Payer: Self-pay | Admitting: Obstetrics and Gynecology

## 2017-12-15 ENCOUNTER — Encounter: Payer: Self-pay | Admitting: Obstetrics and Gynecology

## 2017-12-15 DIAGNOSIS — F419 Anxiety disorder, unspecified: Principal | ICD-10-CM

## 2017-12-15 DIAGNOSIS — F32A Depression, unspecified: Secondary | ICD-10-CM

## 2017-12-15 DIAGNOSIS — F329 Major depressive disorder, single episode, unspecified: Secondary | ICD-10-CM

## 2017-12-15 MED ORDER — LORAZEPAM 0.5 MG PO TABS: 0.5000 mg | ORAL_TABLET | Freq: Two times a day (BID) | ORAL | 0 refills | Status: DC

## 2017-12-15 NOTE — Progress Notes (Signed)
Rx ativan prn panic attacks. Taking paxil with sx improvement. Take sparingly.

## 2017-12-28 ENCOUNTER — Ambulatory Visit (INDEPENDENT_AMBULATORY_CARE_PROVIDER_SITE_OTHER): Payer: BLUE CROSS/BLUE SHIELD | Admitting: Obstetrics and Gynecology

## 2017-12-28 ENCOUNTER — Encounter: Payer: Self-pay | Admitting: Obstetrics and Gynecology

## 2017-12-28 VITALS — Ht 62.0 in | Wt 132.0 lb

## 2017-12-28 DIAGNOSIS — Z01411 Encounter for gynecological examination (general) (routine) with abnormal findings: Secondary | ICD-10-CM | POA: Diagnosis not present

## 2017-12-28 DIAGNOSIS — Z1329 Encounter for screening for other suspected endocrine disorder: Secondary | ICD-10-CM | POA: Diagnosis not present

## 2017-12-28 DIAGNOSIS — F419 Anxiety disorder, unspecified: Secondary | ICD-10-CM

## 2017-12-28 DIAGNOSIS — F329 Major depressive disorder, single episode, unspecified: Secondary | ICD-10-CM | POA: Diagnosis not present

## 2017-12-28 DIAGNOSIS — Z1321 Encounter for screening for nutritional disorder: Secondary | ICD-10-CM | POA: Diagnosis not present

## 2017-12-28 DIAGNOSIS — Z01419 Encounter for gynecological examination (general) (routine) without abnormal findings: Secondary | ICD-10-CM

## 2017-12-28 DIAGNOSIS — Z30431 Encounter for routine checking of intrauterine contraceptive device: Secondary | ICD-10-CM | POA: Diagnosis not present

## 2017-12-28 DIAGNOSIS — Z0000 Encounter for general adult medical examination without abnormal findings: Secondary | ICD-10-CM | POA: Diagnosis not present

## 2017-12-28 DIAGNOSIS — F32A Depression, unspecified: Secondary | ICD-10-CM

## 2017-12-28 NOTE — Progress Notes (Signed)
PCP:  Chad Cordial, PA-C   Chief Complaint  Patient presents with  . Gynecologic Exam     HPI:      [name] is a 37 y.o. [phone] who LMP was No LMP recorded. (Menstrual status: IUD)., presents today for her annual examination.  Her menses are absent with IUD. Has occas light spotting/bleeding and dsymen, improved with NSAIDs.   Sex activity: single partner, contraception - IUD and vasectomy. Mirena placed 10/12/15 Last Pap: November 02, 2015  Results were: no abnormalities /neg HPV DNA  Hx of STDs: none  There is no FH of breast cancer. There is no FH of ovarian cancer. The patient does do self-breast exams. Pt had breast pain at 6/19 appt, thought to be costochondritis, and pain has improved. Pt thought there was a mass but exam showed prominent LT chest wall without bresat masses. Pt doesn't think there has been any size change since 6/19.   Tobacco use: The patient denies current or previous tobacco use. Alcohol use: none No drug use.  Exercise: very active  She does get adequate calcium and Vitamin D in her diet.  I have been treating her for anxiety/depression sx since last yr. Pt was on zoloft 100 mg with sx improvement but stopped it 2/19 due to feeling so groggy/tired in the mornings. Time of dose didn't effect sx. Started on paxil 10 mg 6/19 and still having anxiety sx. Not as tired, but really struggles getting up in the morning, plus still has nausea with the pill. Also notes significant decreased libido with it. She is exercising/seeing therapist, etc, without sx improvement. Has insomnia due to worry. Having panic attacks weekly, no known triggers. Has ativan Rx that for attacks that really doesn't help.  No recent labs done.   Past Medical History:  Diagnosis Date  . Anxiety   . History of Papanicolaou smear of cervix 06/16/2011   -/- NEG  . Ischemic colitis ([place])    HOSPITALIZED 06/2014  . Pancreatitis    POSS D/T WATER CONTAMINATION. PT HAS NOT HAD SXS  SINCE CHANGING TO FILTERED/BOTTLED WATER  . Pars defect of lumbar spine     Past Surgical History:  Procedure Laterality Date  . ARTHROSCOPIC REPAIR ACL Left   . ELBOW SURGERY     LEFT  . WISDOM TOOTH EXTRACTION      Family History  Problem Relation Age of Onset  . Pancreatic cancer Paternal Grandfather   . Colon cancer Paternal Grandfather 105  . Cancer Paternal Grandfather        KIDNEY - UNKNOWN TYPE  . Hypertension Maternal Grandmother   . Heart disease Maternal Grandfather   . Hypertension Maternal Grandfather     Social History   Socioeconomic History  . Marital status: Married    Spouse name: Not on file  . Number of children: 3  . Years of education: Not on file  . Highest education level: Not on file  Occupational History  . Not on file  Social Needs  . Financial resource strain: Not on file  . Food insecurity:    Worry: Not on file    Inability: Not on file  . Transportation needs:    Medical: Not on file    Non-medical: Not on file  Tobacco Use  . Smoking status: Never Smoker  . Smokeless tobacco: Never Used  Substance and Sexual Activity  . Alcohol use: No    Alcohol/week: 0.0 oz  . Drug use: No  .  Sexual activity: Yes    Birth control/protection: IUD    Comment: Mirena   Lifestyle  . Physical activity:    Days per week: 7 days    Minutes per session: Not on file  . Stress: Not on file  Relationships  . Social connections:    Talks on phone: Not on file    Gets together: Not on file    Attends religious service: Not on file    Active member of club or organization: Not on file    Attends meetings of clubs or organizations: Not on file    Relationship status: Not on file  . Intimate partner violence:    Fear of current or ex partner: Not on file    Emotionally abused: Not on file    Physically abused: Not on file    Forced sexual activity: Not on file  Other Topics Concern  . Not on file  Social History Narrative  . Not on file     Outpatient Medications Prior to Visit  Medication Sig Dispense Refill  . levonorgestrel (MIRENA) 20 MCG/24HR IUD 1 each by Intrauterine route once.    Marland Kitchen LORazepam (ATIVAN) 0.5 MG tablet Take 1 tablet (0.5 mg total) by mouth 2 (two) times daily. Prn sx 30 tablet 0  . PARoxetine (PAXIL) 10 MG tablet Take 1/2 tab daily for 6 days, then 1 tab daily 30 tablet 1  . zolpidem (AMBIEN) 5 MG tablet Take 1 tablet (5 mg total) by mouth at bedtime as needed for sleep. 30 tablet 0  . cyclobenzaprine (FLEXERIL) 10 MG tablet Take 1 tablet (10 mg total) by mouth 3 (three) times daily as needed for muscle spasms. (Patient not taking: Reported on 11/09/2017) 21 tablet 0  . SUMAtriptan (IMITREX) 50 MG tablet Take by mouth.     No facility-administered medications prior to visit.       ROS:  Review of Systems  Constitutional: Positive for fatigue. Negative for fever and unexpected weight change.  Respiratory: Negative for cough, shortness of breath and wheezing.   Cardiovascular: Negative for chest pain, palpitations and leg swelling.  Gastrointestinal: Positive for nausea. Negative for blood in stool, constipation, diarrhea and vomiting.  Endocrine: Negative for cold intolerance, heat intolerance and polyuria.  Genitourinary: Negative for dyspareunia, dysuria, flank pain, frequency, genital sores, hematuria, menstrual problem, pelvic pain, urgency, vaginal bleeding, vaginal discharge and vaginal pain.  Musculoskeletal: Negative for back pain, joint swelling and myalgias.  Skin: Negative for rash.  Neurological: Negative for dizziness, syncope, light-headedness, numbness and headaches.  Hematological: Negative for adenopathy.  Psychiatric/Behavioral: Positive for agitation. Negative for confusion, sleep disturbance and suicidal ideas. The patient is not nervous/anxious.   BREAST: No symptoms   Objective: Ht 5\' 2"  (1.575 m)   Wt 132 lb (59.9 kg)   SpO2 97%   BMI 24.14 kg/m    Physical Exam   Constitutional: She is oriented to person, place, and time. She appears well-developed and well-nourished.  Genitourinary: Vagina normal and uterus normal. There is no rash or tenderness on the right labia. There is no rash or tenderness on the left labia. No erythema or tenderness in the vagina. No vaginal discharge found. Right adnexum does not display mass and does not display tenderness. Left adnexum does not display mass and does not display tenderness.  Cervix exhibits visible IUD strings. Cervix does not exhibit motion tenderness or polyp. Uterus is not enlarged or tender.  Neck: Normal range of motion. No thyromegaly present.  Cardiovascular:  Normal rate, regular rhythm and normal heart sounds.  No murmur heard. Pulmonary/Chest: Effort normal and breath sounds normal. Right breast exhibits no mass, no nipple discharge, no skin change and no tenderness. Left breast exhibits no mass, no nipple discharge, no skin change and no tenderness.  Abdominal: Soft. There is no tenderness. There is no guarding.  Musculoskeletal: Normal range of motion.  Neurological: She is alert and oriented to person, place, and time. No cranial nerve deficit.  Psychiatric: She has a normal mood and affect. Her behavior is normal.  Vitals reviewed.   Results: GAD 7 : Generalized Anxiety Score 12/28/2017 11/09/2017  Nervous, Anxious, on Edge 3 3  Control/stop worrying 3 3  Worry too much - different things 3 3  Trouble relaxing 2 3  Restless 1 3  Easily annoyed or irritable 1 2  Afraid - awful might happen 3 3  Total GAD 7 Score 16 20  Anxiety Difficulty Somewhat difficult Somewhat difficult    Depression screen [facility] 2/9 12/28/2017  Decreased Interest 0  Down, Depressed, Hopeless 0  PHQ - 2 Score 0  Altered sleeping 3  Tired, decreased energy 1  Change in appetite 0  Feeling bad or failure about yourself  2  Trouble concentrating 2  Moving slowly or fidgety/restless 0  Suicidal thoughts 0  PHQ-9 Score  8  Difficult doing work/chores Somewhat difficult    Assessment/Plan: Encounter for annual routine gynecological examination  Encounter for routine checking of intrauterine contraceptive device (IUD) - IUD in place. Due for rem 5/22.  Anxiety and depression - Controlled with zoloft but too tired. Not controlled with paxil 10 mg and can't increase due to side effects. Refer to PCP for better mgmt. Chk labs - Plan: Comprehensive metabolic panel, TSH, VITAMIN D 25 Hydroxy (Vit-D Deficiency, Fractures)  Blood tests for routine general physical examination - Plan: Comprehensive metabolic panel, TSH, VITAMIN D 25 Hydroxy (Vit-D Deficiency, Fractures)  Thyroid disorder screening - Plan: TSH  Encounter for vitamin deficiency screening - Plan: VITAMIN D 25 Hydroxy (Vit-D Deficiency, Fractures)          GYN counsel adequate intake of calcium and vitamin D, diet and exercise     F/U  Return in about 1 year (around 12/29/2018).  Alicia B. Copland, PA-C 12/28/2017 3:25 PM

## 2017-12-28 NOTE — Patient Instructions (Signed)
I value your feedback and entrusting us with your care. If you get a [facility] patient survey, I would appreciate you taking the time to let us know about your experience today. Thank you! 

## 2017-12-29 LAB — COMPREHENSIVE METABOLIC PANEL
ALK PHOS: 45 IU/L (ref 39–117)
ALT: 9 IU/L (ref 0–32)
AST: 18 IU/L (ref 0–40)
Albumin/Globulin Ratio: 1.9 (ref 1.2–2.2)
Albumin: 4.7 g/dL (ref 3.5–5.5)
BILIRUBIN TOTAL: 0.9 mg/dL (ref 0.0–1.2)
BUN / CREAT RATIO: 22 (ref 9–23)
BUN: 21 mg/dL — AB (ref 6–20)
CO2: 22 mmol/L (ref 20–29)
CREATININE: 0.96 mg/dL (ref 0.57–1.00)
Calcium: 9.5 mg/dL (ref 8.7–10.2)
Chloride: 99 mmol/L (ref 96–106)
GFR calc Af Amer: 89 mL/min/{1.73_m2} (ref >59)
GFR calc non Af Amer: 77 mL/min/{1.73_m2} (ref >59)
GLUCOSE: 72 mg/dL (ref 65–99)
Globulin, Total: 2.5 g/dL (ref 1.5–4.5)
POTASSIUM: 4.5 mmol/L (ref 3.5–5.2)
SODIUM: 137 mmol/L (ref 134–144)
Total Protein: 7.2 g/dL (ref 6.0–8.5)

## 2017-12-29 LAB — VITAMIN D 25 HYDROXY (VIT D DEFICIENCY, FRACTURES): Vit D, 25-Hydroxy: 26.8 ng/mL — ABNORMAL LOW (ref 30.0–100.0)

## 2017-12-29 LAB — TSH: TSH: 0.666 u[IU]/mL (ref 0.450–4.500)

## 2018-01-09 ENCOUNTER — Other Ambulatory Visit: Payer: Self-pay | Admitting: Obstetrics and Gynecology

## 2018-01-09 DIAGNOSIS — F32A Depression, unspecified: Secondary | ICD-10-CM

## 2018-01-09 DIAGNOSIS — F419 Anxiety disorder, unspecified: Principal | ICD-10-CM

## 2018-01-09 DIAGNOSIS — F329 Major depressive disorder, single episode, unspecified: Secondary | ICD-10-CM

## 2018-01-18 ENCOUNTER — Other Ambulatory Visit: Payer: Self-pay | Admitting: Obstetrics and Gynecology

## 2018-01-18 DIAGNOSIS — F32A Depression, unspecified: Secondary | ICD-10-CM

## 2018-01-18 DIAGNOSIS — F329 Major depressive disorder, single episode, unspecified: Secondary | ICD-10-CM

## 2018-01-18 DIAGNOSIS — F419 Anxiety disorder, unspecified: Principal | ICD-10-CM

## 2018-01-19 MED ORDER — LORAZEPAM 0.5 MG PO TABS: 0.5000 mg | ORAL_TABLET | Freq: Two times a day (BID) | ORAL | 0 refills | Status: AC

## 2018-01-19 NOTE — Telephone Encounter (Signed)
Please advise 

## 2018-01-28 DIAGNOSIS — F419 Anxiety disorder, unspecified: Secondary | ICD-10-CM | POA: Diagnosis not present

## 2018-01-28 DIAGNOSIS — Z6824 Body mass index (BMI) 24.0-24.9, adult: Secondary | ICD-10-CM | POA: Diagnosis not present

## 2018-01-28 DIAGNOSIS — G4700 Insomnia, unspecified: Secondary | ICD-10-CM | POA: Diagnosis not present

## 2018-01-28 DIAGNOSIS — G43109 Migraine with aura, not intractable, without status migrainosus: Secondary | ICD-10-CM | POA: Diagnosis not present

## 2018-04-12 ENCOUNTER — Emergency Department: Payer: BLUE CROSS/BLUE SHIELD

## 2018-04-12 ENCOUNTER — Emergency Department
Admission: EM | Admit: 2018-04-12 | Discharge: 2018-04-13 | Disposition: A | Discharge status: Home / Self Care | Payer: BLUE CROSS/BLUE SHIELD | Attending: Emergency Medicine | Admitting: Emergency Medicine

## 2018-04-12 ENCOUNTER — Other Ambulatory Visit: Payer: Self-pay

## 2018-04-12 ENCOUNTER — Encounter: Payer: Self-pay | Admitting: Radiology

## 2018-04-12 DIAGNOSIS — Z79899 Other long term (current) drug therapy: Secondary | ICD-10-CM | POA: Insufficient documentation

## 2018-04-12 DIAGNOSIS — R51 Headache: Secondary | ICD-10-CM | POA: Insufficient documentation

## 2018-04-12 DIAGNOSIS — R519 Headache, unspecified: Secondary | ICD-10-CM

## 2018-04-12 LAB — COMPREHENSIVE METABOLIC PANEL
ALBUMIN: 4.6 g/dL (ref 3.5–5.0)
ALT: 11 U/L (ref 0–44)
AST: 18 U/L (ref 15–41)
Alkaline Phosphatase: 38 U/L (ref 38–126)
Anion gap: 7 (ref 5–15)
BUN: 27 mg/dL — AB (ref 6–20)
CHLORIDE: 105 mmol/L (ref 98–111)
CO2: 25 mmol/L (ref 22–32)
CREATININE: 0.73 mg/dL (ref 0.44–1.00)
Calcium: 9.3 mg/dL (ref 8.9–10.3)
GFR calc Af Amer: >60 mL/min (ref >60)
GFR calc non Af Amer: >60 mL/min (ref >60)
GLUCOSE: 157 mg/dL — AB (ref 70–99)
Potassium: 3.6 mmol/L (ref 3.5–5.1)
SODIUM: 137 mmol/L (ref 135–145)
Total Bilirubin: 0.9 mg/dL (ref 0.3–1.2)
Total Protein: 7.7 g/dL (ref 6.5–8.1)

## 2018-04-12 LAB — CBC WITH DIFFERENTIAL/PLATELET
ABS IMMATURE GRANULOCYTES: 0.03 10*3/uL (ref 0.00–0.07)
BASOS ABS: 0 10*3/uL (ref 0.0–0.1)
BASOS PCT: 0 %
Eosinophils Absolute: 0 10*3/uL (ref 0.0–0.5)
Eosinophils Relative: 0 %
HCT: 40.2 % (ref 36.0–46.0)
HEMOGLOBIN: 13.9 g/dL (ref 12.0–15.0)
IMMATURE GRANULOCYTES: 0 %
LYMPHS PCT: 22 %
Lymphs Abs: 2.6 10*3/uL (ref 0.7–4.0)
MCH: 31.7 pg (ref 26.0–34.0)
MCHC: 34.6 g/dL (ref 30.0–36.0)
MCV: 91.6 fL (ref 80.0–100.0)
Monocytes Absolute: 0.7 10*3/uL (ref 0.1–1.0)
Monocytes Relative: 6 %
NEUTROS PCT: 72 %
Neutro Abs: 8.7 10*3/uL — ABNORMAL HIGH (ref 1.7–7.7)
PLATELETS: 263 10*3/uL (ref 150–400)
RBC: 4.39 MIL/uL (ref 3.87–5.11)
RDW: 12 % (ref 11.5–15.5)
WBC: 12.1 10*3/uL — AB (ref 4.0–10.5)
nRBC: 0 % (ref 0.0–0.2)

## 2018-04-12 MED ORDER — DIPHENHYDRAMINE HCL 50 MG/ML IJ SOLN
50.0000 mg | Freq: Once | INTRAMUSCULAR | Status: AC
  Administered 2018-04-12: 50 mg via INTRAVENOUS
  Filled 2018-04-12: qty 1

## 2018-04-12 MED ORDER — BUTALBITAL-APAP-CAFFEINE 50-325-40 MG PO TABS
1.0000 | ORAL_TABLET | Freq: Once | ORAL | Status: AC
  Administered 2018-04-13: 1 via ORAL
  Filled 2018-04-12: qty 1

## 2018-04-12 MED ORDER — IOPAMIDOL (ISOVUE-370) INJECTION 76%
75.0000 mL | Freq: Once | INTRAVENOUS | Status: AC | PRN
  Administered 2018-04-12: 75 mL via INTRAVENOUS

## 2018-04-12 MED ORDER — METOCLOPRAMIDE HCL 5 MG/ML IJ SOLN
10.0000 mg | Freq: Once | INTRAMUSCULAR | Status: AC
  Administered 2018-04-12: 10 mg via INTRAVENOUS
  Filled 2018-04-12: qty 2

## 2018-04-12 NOTE — ED Provider Notes (Signed)
[facility] Emergency Department Provider Note ____________________________________________  Time seen: Approximately 9:41 PM  I have reviewed the triage vital signs and the nursing notes.   HISTORY  Chief Complaint Headache   HPI Ellen Blankenship is a 37 y.o. female who presents to the emergency department for treatment and evaluation of headache.   Location: Frontal Similar to previous headaches: no Duration: 3 days TIMING: constant SEVERITY: 8/10 QUALITY:throbbing/dull CONTEXT: started while at work MODIFYING FACTORS: none ASSOCIATED SYMPTOMS: mild dizziness. Past Medical History:  Diagnosis Date  . Anxiety   . History of Papanicolaou smear of cervix 06/16/2011   -/- NEG  . Ischemic colitis ([place])    HOSPITALIZED 06/2014  . Pancreatitis    POSS D/T WATER CONTAMINATION. PT HAS NOT HAD SXS SINCE CHANGING TO FILTERED/BOTTLED WATER  . Pars defect of lumbar spine     Patient Active Problem List   Diagnosis Date Noted  . Anxiety and depression 12/16/2016  . Anxiety 11/14/2016  . Visual field defect 08/03/2015  . History of ischemic bowel disease 08/03/2015  . Pars defect of lumbar spine 08/03/2015    Past Surgical History:  Procedure Laterality Date  . ARTHROSCOPIC REPAIR ACL Left   . ELBOW SURGERY     LEFT  . WISDOM TOOTH EXTRACTION      Prior to Admission medications   Medication Sig Start Date End Date Taking? Authorizing Provider  butalbital-acetaminophen-caffeine (FIORICET, ESGIC) 50-325-40 MG tablet Take 1 tablet by mouth every 6 (six) hours as needed for headache. 04/13/18 04/13/19  Terianne Thaker, Dessa Phi, FNP  cyclobenzaprine (FLEXERIL) 10 MG tablet Take 1 tablet (10 mg total) by mouth 3 (three) times daily as needed for muscle spasms. Patient not taking: Reported on 11/09/2017 05/09/16   Hagler, Jami L, PA-C  ketorolac (TORADOL) 10 MG tablet Take 1 tablet (10 mg total) by mouth every 6 (six) hours as needed. 04/13/18   Itzael Liptak, Dessa Phi, FNP   levonorgestrel (MIRENA) 20 MCG/24HR IUD 1 each by Intrauterine route once.    [provider]  LORazepam (ATIVAN) 0.5 MG tablet Take 1 tablet (0.5 mg total) by mouth 2 (two) times daily. Prn sx 9/92/42   Copland, Elmo Putt B, PA-C  PARoxetine (PAXIL) 10 MG tablet Take 1/2 tab daily for 6 days, then 1 tab daily 6/83/41   Copland, Alicia B, PA-C  SUMAtriptan (IMITREX) 50 MG tablet Take by mouth. 06/26/17 06/27/18  [provider]  zolpidem (AMBIEN) 5 MG tablet Take 1 tablet (5 mg total) by mouth at bedtime as needed for sleep. 9/62/22   Copland, [name], PA-C    Allergies Sulfa antibiotics  Family History  Problem Relation Age of Onset  . Pancreatic cancer Paternal Grandfather   . Colon cancer Paternal Grandfather 13  . Cancer Paternal Grandfather        KIDNEY - UNKNOWN TYPE  . Hypertension Maternal Grandmother   . Heart disease Maternal Grandfather   . Hypertension Maternal Grandfather     Social History Social History   Tobacco Use  . Smoking status: Never Smoker  . Smokeless tobacco: Never Used  Substance Use Topics  . Alcohol use: No    Alcohol/week: 0.0 standard drinks  . Drug use: No    Review of Systems Constitutional: No fever/chills or recent injury. Eyes: No visual changes. ENT: No sore throat. Respiratory: Denies shortness of breath. Gastrointestinal: No abdominal pain.  No nausea, no vomiting.  No diarrhea.  No constipation. Musculoskeletal: Negative for pain. Skin: Negative for rash.  Neurological:Positive for headache, negative for focal weakness or numbness. No confusion or fainting. ___________________________________________   PHYSICAL EXAM:  VITAL SIGNS: ED Triage Vitals  Enc Vitals Group     BP 04/12/18 1904 (!) 140/101     Pulse Rate 04/12/18 1904 63     Resp --      Temp 04/12/18 1904 98.6 F (37 C)     Temp Source 04/12/18 1904 Oral     SpO2 04/12/18 1904 97 %     Weight 04/12/18 1904 130 lb (59 kg)     Height 04/12/18 1904  5\' 2"  (1.575 m)     Head Circumference --      Peak Flow --      Pain Score 04/12/18 1905 8     Pain Loc --      Pain Edu? --      Excl. in [place]? --     Constitutional: Alert and oriented. Well appearing and in no acute distress. Eyes: Conjunctivae are normal. PERRL. EOMI without expressed pain. No evidence of papilledema on limited exam. Head: Atraumatic. Nose: No congestion/rhinnorhea. Mouth/Throat: Mucous membranes are moist.  Oropharynx non-erythematous. Neck: No stridor. Supple, no meningismus.  Cardiovascular: Normal rate, regular rhythm. Grossly normal heart sounds.  Good peripheral circulation. Respiratory: Normal respiratory effort.  No retractions. Lungs CTAB. Gastrointestinal: Soft and nontender. No distention.  Musculoskeletal: No lower extremity tenderness nor edema.  No joint effusions. Neurologic:  Normal speech and language. No gross focal neurologic deficits are appreciated. No gait instability. Cranial nerves: 2-10 normal as tested. Cerebellar:Normal Romberg, finger-nose-finger, heel to shin, normal gait. Sensorimotor: No aphasia, pronator drift, clonus, sensory loss or abnormal reflexes.  Skin:  Skin is warm, dry and intact. No rash noted. Psychiatric: Mood and affect are normal. Speech and behavior are normal. Normal thought process and cognition.  ____________________________________________   LABS (all labs ordered are listed, but only abnormal results are displayed)  Labs Reviewed  CBC WITH DIFFERENTIAL/PLATELET - Abnormal; Notable for the following components:      Result Value   WBC 12.1 (*)    Neutro Abs 8.7 (*)    All other components within normal limits  COMPREHENSIVE METABOLIC PANEL - Abnormal; Notable for the following components:   Glucose, Bld 157 (*)    BUN 27 (*)    All other components within normal limits  URINALYSIS, COMPLETE (UACMP) WITH MICROSCOPIC - Abnormal; Notable for the following components:   Color, Urine STRAW (*)    APPearance  CLEAR (*)    Hgb urine dipstick MODERATE (*)    All other components within normal limits  POC URINE PREG, ED   ____________________________________________  EKG  Not indicated. ____________________________________________  RADIOLOGY  Ct Angio Head W Or Wo Contrast  Result Date: 04/13/2018 CLINICAL DATA:  Initial evaluation for acute right-sided headache. EXAM: CT ANGIOGRAPHY HEAD TECHNIQUE: Multidetector CT imaging of the head was performed using the standard protocol during bolus administration of intravenous contrast. Multiplanar CT image reconstructions and MIPs were obtained to evaluate the vascular anatomy. CONTRAST:  46mL ISOVUE-370 IOPAMIDOL (ISOVUE-370) INJECTION 76% COMPARISON:  Previous brain MRI from 11/14/2015. FINDINGS: CT HEAD Brain: Cerebral volume within normal limits for patient age. No evidence for acute intracranial hemorrhage. No findings to suggest acute large vessel territory infarct. No mass lesion, midline shift, or mass effect. Ventricles are normal in size without evidence for hydrocephalus. No extra-axial fluid collection identified. Vascular: No hyperdense vessel identified. Skull: Scalp soft tissues demonstrate no acute abnormality. Calvarium intact. Sinuses/Orbits:  Globes and orbital soft tissues within normal limits. Visualized paranasal sinuses are clear. No mastoid effusion. CTA HEAD Anterior circulation: Internal carotid arteries widely patent to the termini without stenosis. ICA termini themselves are widely patent. A1 segments, anterior communicating artery common anterior cerebral arteries widely patent. M1 segments widely patent without stenosis or occlusion. Normal MCA bifurcations. Distal MCA branches well perfused and symmetric. Posterior circulation: Visualized vertebral arteries widely patent to the vertebrobasilar junction without stenosis. Left vertebral artery slightly dominant. Posterior inferior cerebral arteries patent bilaterally. Basilar artery  widely patent to its distal aspect. Superior cerebellar and posterior cerebral arteries patent bilaterally. Small left posterior communicating artery noted. Venous sinuses: Grossly patent, although not well assessed due to arterial timing the contrast bolus. Anatomic variants: None significant. No intracranial aneurysm or other vascular abnormality. Delayed phase: No abnormal enhancement. IMPRESSION: Normal CTA of the head. No acute intracranial abnormality identified. Electronically Signed   By: Jeannine Boga M.D.   On: 04/13/2018 00:55   ____________________________________________   PROCEDURES  Procedure(s) performed:  Procedures  Critical Care performed: None ____________________________________________   INITIAL IMPRESSION / ASSESSMENT AND PLAN / ED COURSE  37 year old female presenting to the emergency department for treatment and evaluation of headache that started approximately 2 weeks ago.  She has been evaluated by her primary care for the same and was advised to take Tylenol, ibuprofen, Sudafed, and Imitrex.  She has attempted the above without any improvement.  Headache worsened last night.  Patient is tearful and states that she is mostly concerned because her great grandmother "droped dead in her 53s due to an aneurysm in her brain."  CTA of the head has been ordered.  Headache decreased to a tolerable level after medications. She will be discharged home with Toradol and Fioricet. She is an established patient with Dr. Melrose Nakayama and will call tomorrow to schedule a follow up appointment. Patient agrees to the plan and feels ready to go home.  Pertinent labs & imaging results that were available during my care of the patient were reviewed by me and considered in my medical decision making (see chart for details). ____________________________________________   FINAL CLINICAL IMPRESSION(S) / ED DIAGNOSES  Final diagnoses:  Acute intractable headache, unspecified headache  type    ED Discharge Orders         Ordered    butalbital-acetaminophen-caffeine (FIORICET, ESGIC) 50-325-40 MG tablet  Every 6 hours PRN     04/13/18 0259    ketorolac (TORADOL) 10 MG tablet  Every 6 hours PRN     04/13/18 0259            Victorino Dike, FNP 04/13/18 4235    Harvest Dark, MD 04/13/18 2210

## 2018-04-12 NOTE — ED Notes (Signed)
Reminded pt a urine sample is needed.  Hat placed in toilet.

## 2018-04-12 NOTE — ED Triage Notes (Signed)
Reports headache for the past 2 weeks, and has seen pmd for same.  Reports concerned due to family history of aneurysm.

## 2018-04-12 NOTE — ED Provider Notes (Signed)
-----------------------------------------   11:16 PM on 04/12/2018 -----------------------------------------  Patient seen in conjunction with [facility].  Patient has 2 weeks of right-sided headache.  Patient states she has had frequent headaches in the past was prescribed Imitrex, tried using Imitrex during this headache but it did not help although states she admitted that she used it approximately 1 week after the headache started.  Patient states that headache has been ongoing nearly 2 weeks at this time.  Is an 8/10 in severity currently.  Denies any focal weakness or numbness of any arm or leg confusion or slurred speech.  No fever.  Patient is worried because of a family history of brain aneurysm leading to a grandmothers death.  Patient states she has never been formally diagnosed with migraines.  Does state that headache is worse when she looks at light appears loud noises, often nauseated with her headaches as well.  As a precaution we will proceed with CTA imaging of the head to rule out aneurysm.  We will treat with migraine cocktail medications IV hydrate and continue to closely monitor.  On my examination patient does have mild photophobia, cranial nerves intact, equal grip strength, no pronator drift, no other concerning findings.   Harvest Dark, MD 04/12/18 2318

## 2018-04-13 LAB — URINALYSIS, COMPLETE (UACMP) WITH MICROSCOPIC
Bacteria, UA: NONE SEEN
Bilirubin Urine: NEGATIVE
GLUCOSE, UA: NEGATIVE mg/dL
Ketones, ur: NEGATIVE mg/dL
Leukocytes, UA: NEGATIVE
Nitrite: NEGATIVE
PH: 6 (ref 5.0–8.0)
Protein, ur: NEGATIVE mg/dL
Specific Gravity, Urine: 1.025 (ref 1.005–1.030)

## 2018-04-13 MED ORDER — KETOROLAC TROMETHAMINE 10 MG PO TABS: 10.0000 mg | ORAL_TABLET | Freq: Four times a day (QID) | ORAL | 0 refills | Status: DC | PRN

## 2018-04-13 MED ORDER — KETOROLAC TROMETHAMINE 30 MG/ML IJ SOLN
30.0000 mg | Freq: Once | INTRAMUSCULAR | Status: AC
  Administered 2018-04-13: 30 mg via INTRAVENOUS
  Filled 2018-04-13: qty 1

## 2018-04-13 MED ORDER — BUTALBITAL-APAP-CAFFEINE 50-325-40 MG PO TABS: 1.0000 | ORAL_TABLET | Freq: Four times a day (QID) | ORAL | 0 refills | Status: AC | PRN

## 2018-04-13 MED ORDER — ONDANSETRON HCL 4 MG/2ML IJ SOLN
4.0000 mg | Freq: Once | INTRAMUSCULAR | Status: AC
  Administered 2018-04-13: 4 mg via INTRAVENOUS
  Filled 2018-04-13: qty 2

## 2018-04-13 MED ORDER — MORPHINE SULFATE (PF) 4 MG/ML IV SOLN
4.0000 mg | Freq: Once | INTRAVENOUS | Status: AC
  Administered 2018-04-13: 4 mg via INTRAVENOUS
  Filled 2018-04-13: qty 1

## 2018-04-13 NOTE — Discharge Instructions (Signed)
Please call tomorrow to schedule follow-up appointment with neurology.  Return to the emergency department for symptoms change or worsen if you are unable to see your primary care provider or the specialist.

## 2018-04-16 DIAGNOSIS — R51 Headache: Secondary | ICD-10-CM | POA: Diagnosis not present

## 2018-07-13 DIAGNOSIS — R51 Headache: Secondary | ICD-10-CM | POA: Diagnosis not present

## 2018-07-15 ENCOUNTER — Ambulatory Visit (INDEPENDENT_AMBULATORY_CARE_PROVIDER_SITE_OTHER): Payer: BLUE CROSS/BLUE SHIELD | Admitting: Obstetrics and Gynecology

## 2018-07-15 ENCOUNTER — Encounter: Payer: Self-pay | Admitting: Obstetrics and Gynecology

## 2018-07-15 VITALS — BP 104/68 | HR 69 | Ht 62.0 in | Wt 136.0 lb

## 2018-07-15 DIAGNOSIS — Z30432 Encounter for removal of intrauterine contraceptive device: Secondary | ICD-10-CM | POA: Diagnosis not present

## 2018-07-15 DIAGNOSIS — G43119 Migraine with aura, intractable, without status migrainosus: Secondary | ICD-10-CM

## 2018-07-15 NOTE — Progress Notes (Signed)
   Chief Complaint  Patient presents with  . IUD removal    not sure if wants new BC     History of Present Illness:  Ellen Blankenship is a 38 y.o. that had a Mirena IUD placed approximately 3 years ago. Since that time, she denies dyspareunia, pelvic pain, non-menstrual bleeding, vaginal d/c, heavy bleeding. She has been having increased migraines with aura and wants to remove possibility of IUD being contributing factor. Husband with vasectomy.  Annual due 7/20  BP 104/68   Pulse 69   Ht 5\' 2"  (1.575 m)   Wt 136 lb (61.7 kg)   BMI 24.87 kg/m   Pelvic exam:  Two IUD strings present seen coming from the cervical os. EGBUS, vaginal vault and cervix: within normal limits  IUD Removal Strings of IUD identified and grasped.  IUD removed without problem with ring forceps.  Pt tolerated this well.  IUD noted to be intact.  Assessment:  Encounter for removal of intrauterine contraceptive device (IUD)  Intractable migraine with aura without status migrainosus - Discussed prog only BC options if needs in future for menses. Husband with vasectomy   Plan: IUD removed and plan for contraception is vasectomy. She was amenable to this plan.  Alicia B. Copland, PA-C 07/15/2018 10:44 AM

## 2018-07-15 NOTE — Patient Instructions (Signed)
I value your feedback and entrusting us with your care. If you get a [facility] patient survey, I would appreciate you taking the time to let us know about your experience today. Thank you! 

## 2018-11-10 ENCOUNTER — Encounter: Payer: Self-pay | Admitting: Obstetrics and Gynecology

## 2018-11-10 ENCOUNTER — Ambulatory Visit (INDEPENDENT_AMBULATORY_CARE_PROVIDER_SITE_OTHER): Payer: BC Managed Care – PPO | Admitting: Obstetrics and Gynecology

## 2018-11-10 ENCOUNTER — Ambulatory Visit (INDEPENDENT_AMBULATORY_CARE_PROVIDER_SITE_OTHER): Payer: BC Managed Care – PPO

## 2018-11-10 ENCOUNTER — Other Ambulatory Visit: Payer: Self-pay

## 2018-11-10 VITALS — BP 98/62 | HR 69 | Ht 62.0 in | Wt 130.0 lb

## 2018-11-10 DIAGNOSIS — R1032 Left lower quadrant pain: Secondary | ICD-10-CM

## 2018-11-10 DIAGNOSIS — N941 Unspecified dyspareunia: Secondary | ICD-10-CM | POA: Diagnosis not present

## 2018-11-10 DIAGNOSIS — N83291 Other ovarian cyst, right side: Secondary | ICD-10-CM | POA: Diagnosis not present

## 2018-11-10 DIAGNOSIS — N83201 Unspecified ovarian cyst, right side: Secondary | ICD-10-CM | POA: Insufficient documentation

## 2018-11-10 NOTE — Progress Notes (Signed)
Copland, Ellen Evener, PA-C   Chief Complaint  Patient presents with  . Gynecologic Exam    pain w/intercourse & left lower abominal pain x3 wks    HPI:      Ellen Blankenship is a 38 y.o. [phone] who LMP was Patient's last menstrual period was 10/13/2018., presents today for intermittent, sharp LLQ pains, daily for the past 3 wks. Sx are random and last a few min. No meds taken for sx since short lived. Pt does notice LLQ pain with sex the past 3 wks as well, which is highly unusual for pt. No new partners. Has had loose stools several times a day for about 8 wks. Usually has daily formed stool, but it has been different. Pt with hx of ischemic colitis about 4-5 yrs ago and wonders if scar tissue present. No urin sx, LBP, fevers. No vag sx.  Pt's menses monthly, lasting 4 days, no BTB, mild dysmen. Had IUD removed 2/20 and husband with vasectomy. Pt thinks migraines improved since IUD removed.   Annual due 7/20.  Past Medical History:  Diagnosis Date  . Anxiety   . History of Papanicolaou smear of cervix 06/16/2011   -/- NEG  . Ischemic colitis ([place])    HOSPITALIZED 06/2014  . Migraine with aura   . Pancreatitis    POSS D/T WATER CONTAMINATION. PT HAS NOT HAD SXS SINCE CHANGING TO FILTERED/BOTTLED WATER  . Pars defect of lumbar spine     Past Surgical History:  Procedure Laterality Date  . ARTHROSCOPIC REPAIR ACL Left   . ELBOW SURGERY     LEFT  . WISDOM TOOTH EXTRACTION      Family History  Problem Relation Age of Onset  . Pancreatic cancer Paternal Grandfather   . Colon cancer Paternal Grandfather 92  . Cancer Paternal Grandfather        KIDNEY - UNKNOWN TYPE  . Hypertension Maternal Grandmother   . Heart disease Maternal Grandfather   . Hypertension Maternal Grandfather     Social History   Socioeconomic History  . Marital status: Married    Spouse name: Not on file  . Number of children: 3  . Years of education: Not on file  . Highest education level: Not  on file  Occupational History  . Not on file  Social Needs  . Financial resource strain: Not on file  . Food insecurity:    Worry: Not on file    Inability: Not on file  . Transportation needs:    Medical: Not on file    Non-medical: Not on file  Tobacco Use  . Smoking status: Never Smoker  . Smokeless tobacco: Never Used  Substance and Sexual Activity  . Alcohol use: No    Alcohol/week: 0.0 standard drinks  . Drug use: No  . Sexual activity: Yes    Birth control/protection: None  Lifestyle  . Physical activity:    Days per week: 7 days    Minutes per session: Not on file  . Stress: Not on file  Relationships  . Social connections:    Talks on phone: Not on file    Gets together: Not on file    Attends religious service: Not on file    Active member of club or organization: Not on file    Attends meetings of clubs or organizations: Not on file    Relationship status: Not on file  . Intimate partner violence:    Fear of current or ex  partner: Not on file    Emotionally abused: Not on file    Physically abused: Not on file    Forced sexual activity: Not on file  Other Topics Concern  . Not on file  Social History Narrative  . Not on file    Outpatient Medications Prior to Visit  Medication Sig Dispense Refill  . butalbital-acetaminophen-caffeine (FIORICET, ESGIC) 50-325-40 MG tablet Take 1 tablet by mouth every 6 (six) hours as needed for headache. 20 tablet 0  . cyclobenzaprine (FLEXERIL) 10 MG tablet Take 1 tablet (10 mg total) by mouth 3 (three) times daily as needed for muscle spasms. 21 tablet 0  . LORazepam (ATIVAN) 0.5 MG tablet Take 1 tablet (0.5 mg total) by mouth 2 (two) times daily. Prn sx 30 tablet 0  . zolpidem (AMBIEN) 5 MG tablet Take 1 tablet (5 mg total) by mouth at bedtime as needed for sleep. (Patient not taking: Reported on 11/10/2018) 30 tablet 0   No facility-administered medications prior to visit.       ROS:  Review of Systems   Constitutional: Negative for fatigue, fever and unexpected weight change.  Respiratory: Negative for cough, shortness of breath and wheezing.   Cardiovascular: Negative for chest pain, palpitations and leg swelling.  Gastrointestinal: Positive for diarrhea. Negative for blood in stool, constipation, nausea and vomiting.  Endocrine: Negative for cold intolerance, heat intolerance and polyuria.  Genitourinary: Positive for dyspareunia and pelvic pain. Negative for dysuria, flank pain, frequency, genital sores, hematuria, menstrual problem, urgency, vaginal bleeding, vaginal discharge and vaginal pain.  Musculoskeletal: Negative for back pain, joint swelling and myalgias.  Skin: Negative for rash.  Neurological: Negative for dizziness, syncope, light-headedness, numbness and headaches.  Hematological: Negative for adenopathy.  Psychiatric/Behavioral: Negative for agitation, confusion, sleep disturbance and suicidal ideas. The patient is not nervous/anxious.   BREAST: No symptoms   OBJECTIVE:   Vitals:  BP 98/62 (BP Location: Left Arm, Patient Position: Sitting, Cuff Size: Normal)   Pulse 69   Ht 5\' 2"  (1.575 m)   Wt 130 lb (59 kg)   LMP 10/13/2018   BMI 23.78 kg/m   Physical Exam Vitals signs reviewed.  Constitutional:      Appearance: She is well-developed.  Neck:     Musculoskeletal: Normal range of motion.  Pulmonary:     Effort: Pulmonary effort is normal.  Abdominal:     Palpations: Abdomen is soft.     Tenderness: There is abdominal tenderness in the left lower quadrant. There is no guarding or rebound.  Genitourinary:    General: Normal vulva.     Pubic Area: No rash.      Labia:        Right: No rash, tenderness or lesion.        Left: No rash, tenderness or lesion.      Vagina: No tenderness.     Cervix: No cervical motion tenderness.     Uterus: Normal. Not enlarged and not tender.      Adnexa: Right adnexa normal.       Right: No mass or tenderness.          Left: Tenderness present. No mass.    Musculoskeletal: Normal range of motion.  Skin:    General: Skin is warm and dry.  Neurological:     General: No focal deficit present.     Mental Status: She is alert and oriented to person, place, and time.  Psychiatric:        Mood  and Affect: Mood normal.        Behavior: Behavior normal.        Thought Content: Thought content normal.        Judgment: Judgment normal.     Results:  ULTRASOUND REPORT  Location: [facility]  Date of Service: 11/10/2018   Indications:Pelvic Pain Findings:  The uterus is anteverted and measures 7.2 x 5.2 x 4.6cm. Echo texture is homogenous without evidence of focal masses.  The Endometrium measures 6.8 mm.  Right Ovary measures 3.3 x 2.6 x 2.1 cm with complex cyst measuring 1.4 x 1.9 x 1.3cm. Left Ovary measures 2.6 x 1.4 x 1.7 cm. It is normal in appearance. Survey of the adnexa demonstrates no adnexal masses. There is no free fluid in the cul de sac.  Impression: 1. Complex right ovarian cyst 1.9cm, otherwise normal gyn ultrasound.   Recommendations: 1.Clinical correlation with the patient's History and Physical Exam.   Vita Barley, RT   Assessment/Plan: LLQ pain - For 3 wks. Neg GYN u/s for LLQ. Given recent loose stools, most likely GI and pt to f/u with them.  - Plan: US PELVIS TRANSVANGINAL NON-OB (TV ONLY)  Dyspareunia in female - Since LLQ pain sx started. F/u with GI.  Right ovarian cyst - No RT side sx. Rechk u/s in 8 wks with annual.     Return in about 8 weeks (around 01/05/2019) for annual with GYN u/s before for RTO cyst f/u.  Alicia B. Copland, PA-C 11/10/2018 4:38 PM

## 2018-11-10 NOTE — Patient Instructions (Signed)
I value your feedback and entrusting us with your care. If you get a [facility] patient survey, I would appreciate you taking the time to let us know about your experience today. Thank you! 

## 2018-11-12 ENCOUNTER — Other Ambulatory Visit
Admission: RE | Admit: 2018-11-12 | Discharge: 2018-11-12 | Disposition: A | Discharge status: Home / Self Care | Payer: BC Managed Care – PPO | Source: Ambulatory Visit | Attending: Student | Admitting: Student

## 2018-11-12 DIAGNOSIS — Z8719 Personal history of other diseases of the digestive system: Secondary | ICD-10-CM | POA: Diagnosis not present

## 2018-11-12 DIAGNOSIS — R195 Other fecal abnormalities: Secondary | ICD-10-CM | POA: Diagnosis not present

## 2018-11-12 DIAGNOSIS — R1012 Left upper quadrant pain: Secondary | ICD-10-CM | POA: Diagnosis not present

## 2018-11-12 DIAGNOSIS — G8929 Other chronic pain: Secondary | ICD-10-CM | POA: Diagnosis not present

## 2018-11-12 DIAGNOSIS — R1032 Left lower quadrant pain: Secondary | ICD-10-CM | POA: Diagnosis not present

## 2018-11-12 LAB — GASTROINTESTINAL PANEL BY PCR, STOOL (REPLACES STOOL CULTURE)

## 2018-11-12 LAB — C DIFFICILE QUICK SCREEN W PCR REFLEX
C Diff interpretation: NOT DETECTED
C Diff toxin: NEGATIVE

## 2018-11-12 LAB — C DIFFICILE QUICK SCREEN W PCR REFLEX??: C Diff antigen: NEGATIVE

## 2018-11-14 LAB — H. PYLORI ANTIGEN, STOOL: H. Pylori Stool Ag, Eia: NEGATIVE

## 2018-11-15 ENCOUNTER — Other Ambulatory Visit: Payer: Self-pay | Admitting: Student

## 2018-11-15 DIAGNOSIS — Z8719 Personal history of other diseases of the digestive system: Secondary | ICD-10-CM

## 2018-11-15 DIAGNOSIS — R1013 Epigastric pain: Secondary | ICD-10-CM

## 2018-11-15 DIAGNOSIS — R110 Nausea: Secondary | ICD-10-CM

## 2018-11-15 DIAGNOSIS — R109 Unspecified abdominal pain: Secondary | ICD-10-CM

## 2018-11-16 ENCOUNTER — Other Ambulatory Visit: Payer: Self-pay

## 2018-11-16 ENCOUNTER — Ambulatory Visit
Admission: RE | Admit: 2018-11-16 | Discharge: 2018-11-16 | Disposition: A | Discharge status: Home / Self Care | Payer: BC Managed Care – PPO | Source: Ambulatory Visit | Attending: Student | Admitting: Student

## 2018-11-16 DIAGNOSIS — R1013 Epigastric pain: Secondary | ICD-10-CM | POA: Insufficient documentation

## 2018-11-16 DIAGNOSIS — R110 Nausea: Secondary | ICD-10-CM | POA: Diagnosis not present

## 2018-11-16 DIAGNOSIS — Z8719 Personal history of other diseases of the digestive system: Secondary | ICD-10-CM | POA: Insufficient documentation

## 2018-11-16 DIAGNOSIS — R109 Unspecified abdominal pain: Secondary | ICD-10-CM | POA: Diagnosis not present

## 2018-11-16 DIAGNOSIS — Z01812 Encounter for preprocedural laboratory examination: Secondary | ICD-10-CM | POA: Diagnosis not present

## 2018-11-16 DIAGNOSIS — R194 Change in bowel habit: Secondary | ICD-10-CM | POA: Diagnosis not present

## 2018-11-16 LAB — CALPROTECTIN, FECAL: Calprotectin, Fecal: <16 ug/g (ref 0–120)

## 2018-11-16 MED ORDER — IOHEXOL 300 MG/ML  SOLN
85.0000 mL | Freq: Once | INTRAMUSCULAR | Status: AC | PRN
  Administered 2018-11-16: 85 mL via INTRAVENOUS

## 2018-11-17 LAB — PANCREATIC ELASTASE, FECAL: Pancreatic Elastase-1, Stool: >500 ug Elast./g (ref >200)

## 2018-11-18 ENCOUNTER — Other Ambulatory Visit: Payer: Self-pay

## 2018-11-18 ENCOUNTER — Ambulatory Visit (INDEPENDENT_AMBULATORY_CARE_PROVIDER_SITE_OTHER): Payer: BC Managed Care – PPO | Admitting: Vascular Surgery

## 2018-11-18 ENCOUNTER — Encounter (INDEPENDENT_AMBULATORY_CARE_PROVIDER_SITE_OTHER): Payer: Self-pay | Admitting: Vascular Surgery

## 2018-11-18 DIAGNOSIS — K559 Vascular disorder of intestine, unspecified: Secondary | ICD-10-CM | POA: Diagnosis not present

## 2018-11-18 DIAGNOSIS — R1013 Epigastric pain: Secondary | ICD-10-CM

## 2018-11-21 ENCOUNTER — Encounter (INDEPENDENT_AMBULATORY_CARE_PROVIDER_SITE_OTHER): Payer: Self-pay | Admitting: Vascular Surgery

## 2018-11-21 DIAGNOSIS — K559 Vascular disorder of intestine, unspecified: Secondary | ICD-10-CM | POA: Insufficient documentation

## 2018-11-21 DIAGNOSIS — R1013 Epigastric pain: Secondary | ICD-10-CM | POA: Insufficient documentation

## 2018-11-21 NOTE — Progress Notes (Signed)
MRN : [id number]  Ellen Blankenship is a 38 y.o. (10-May-1981) female who presents with chief complaint of  Chief Complaint  Patient presents with  . New Patient (Initial Visit)  .  History of Present Illness:    I am asked to evaluate the patient for the complaint of abdominal pain with uncertain etiology.  The patient is followed by GI.  Her primary service is concerned about mesenteric ischemia.  The patient has noted some weight loss as well as nausea.  The patient does not substantiate food fear, particular foods do not seem to aggravate or alleviate the symptoms.  The patient denies bloody bowel movements she alternates between diarrhea and constipation.  The patient has a history of ischemic colitis associated with pancreatitis years ago and has had abdominal c/o on and off since.  Prior colonoscopy which was not diagnostic.  No history of peptic ulcer disease.   No prior peripheral angiograms or vascular interventions.  The patient denies amaurosis fugax or recent TIA symptoms. There are no recent neurological changes noted. The patient denies claudication symptoms or rest pain symptoms. The patient denies history of DVT, PE or superficial thrombophlebitis. The patient denies recent episodes of angina   Ct scan with IV contrast is reviewed by me and it show a completely normal mesenteric macrovascular system.  No vascular abnormalities are present.   Current Meds  Medication Sig  . butalbital-acetaminophen-caffeine (FIORICET, ESGIC) 50-325-40 MG tablet Take 1 tablet by mouth every 6 (six) hours as needed for headache.  . cyclobenzaprine (FLEXERIL) 10 MG tablet Take 1 tablet (10 mg total) by mouth 3 (three) times daily as needed for muscle spasms.  Marland Kitchen loratadine (CLARITIN) 10 MG tablet Take 10 mg by mouth daily.  Marland Kitchen LORazepam (ATIVAN) 0.5 MG tablet Take 1 tablet (0.5 mg total) by mouth 2 (two) times daily. Prn sx    Past Medical History:  Diagnosis Date  . Anxiety   . History  of Papanicolaou smear of cervix 06/16/2011   -/- NEG  . Ischemic colitis ([place])    HOSPITALIZED 06/2014  . Migraine with aura   . Pancreatitis    POSS D/T WATER CONTAMINATION. PT HAS NOT HAD SXS SINCE CHANGING TO FILTERED/BOTTLED WATER  . Pars defect of lumbar spine     Past Surgical History:  Procedure Laterality Date  . ARTHROSCOPIC REPAIR ACL Left   . ELBOW SURGERY     LEFT  . WISDOM TOOTH EXTRACTION      Social History Social History   Tobacco Use  . Smoking status: Never Smoker  . Smokeless tobacco: Never Used  Substance Use Topics  . Alcohol use: No    Alcohol/week: 0.0 standard drinks  . Drug use: No    Family History Family History  Problem Relation Age of Onset  . Pancreatic cancer Paternal Grandfather   . Colon cancer Paternal Grandfather 69  . Cancer Paternal Grandfather        KIDNEY - UNKNOWN TYPE  . Hypertension Maternal Grandmother   . Heart disease Maternal Grandfather   . Hypertension Maternal Grandfather   No family history of bleeding/clotting disorders, porphyria or autoimmune disease   Allergies  Allergen Reactions  . Paroxetine Other (See Comments)    Non-allergic side effects Non-allergic side effects   . Sulfa Antibiotics     Other reaction(s): Other (See Comments) Blisters in eye with sulfa eye drops     REVIEW OF SYSTEMS (Negative unless checked)  Constitutional: [] Weight loss  [] Fever  []   Chills Cardiac: [] Chest pain   [] Chest pressure   [] Palpitations   [] Shortness of breath when laying flat   [] Shortness of breath with exertion. Vascular:  [] Pain in legs with walking   [] Pain in legs at rest  [] History of DVT   [] Phlebitis   [] Swelling in legs   [] Varicose veins   [] Non-healing ulcers Pulmonary:   [] Uses home oxygen   [] Productive cough   [] Hemoptysis   [] Wheeze  [] COPD   [] Asthma Neurologic:  [] Dizziness   [] Seizures   [] History of stroke   [] History of TIA  [] Aphasia   [] Vissual changes   [] Weakness or numbness in arm    [] Weakness or numbness in leg Musculoskeletal:   [] Joint swelling   [] Joint pain   [] Low back pain Hematologic:  [] Easy bruising  [] Easy bleeding   [] Hypercoagulable state   [] Anemic Gastrointestinal:  [] Diarrhea   [] Vomiting  [] Gastroesophageal reflux/heartburn   [] Difficulty swallowing. Genitourinary:  [] Chronic kidney disease   [] Difficult urination  [] Frequent urination   [] Blood in urine Skin:  [] Rashes   [] Ulcers  Psychological:  [] History of anxiety   []  History of major depression.  Physical Examination  Vitals:   11/18/18 1106  BP: 109/71  Pulse: (!) 56  Resp: 12  Weight: 133 lb (60.3 kg)  Height: 5\' 2"  (1.575 m)   Body mass index is 24.33 kg/m. Gen: WD/WN, NAD Head: [place]/AT, No temporalis wasting.  Ear/Nose/Throat: Hearing grossly intact, nares w/o erythema or drainage, poor dentition Eyes: PER, EOMI, sclera nonicteric.  Neck: Supple, no masses.  No bruit or JVD.  Pulmonary:  Good air movement, clear to auscultation bilaterally, no use of accessory muscles.  Cardiac: RRR, normal S1, S2, no Murmurs. Vascular:  Vessel Right Left  Radial Palpable Palpable  Gastrointestinal: soft, non-distended. No guarding/no peritoneal signs.  Musculoskeletal: M/S 5/5 throughout.  No deformity or atrophy.  Neurologic: CN 2-12 intact. Pain and light touch intact in extremities.  Symmetrical.  Speech is fluent. Motor exam as listed above. Psychiatric: Judgment intact, Mood & affect appropriate for pt's clinical situation. Dermatologic: No rashes or ulcers noted.  No changes consistent with cellulitis. Lymph : No Cervical lymphadenopathy, no lichenification or skin changes of chronic lymphedema.  CBC Lab Results  Component Value Date   WBC 12.1 (H) 04/12/2018   HGB 13.9 04/12/2018   HCT 40.2 04/12/2018   MCV 91.6 04/12/2018   PLT 263 04/12/2018    BMET    Component Value Date/Time   NA 137 04/12/2018 2216   NA 137 12/28/2017 1415   NA 144 06/27/2014 0420   K 3.6 04/12/2018 2216    K 4.0 06/27/2014 0420   CL 105 04/12/2018 2216   CL 108 (H) 06/27/2014 0420   CO2 25 04/12/2018 2216   CO2 30 06/27/2014 0420   GLUCOSE 157 (H) 04/12/2018 2216   GLUCOSE 92 06/27/2014 0420   BUN 27 (H) 04/12/2018 2216   BUN 21 (H) 12/28/2017 1415   BUN 6 (L) 06/27/2014 0420   CREATININE 0.73 04/12/2018 2216   CREATININE 0.96 06/27/2014 0420   CALCIUM 9.3 04/12/2018 2216   CALCIUM 8.6 06/27/2014 0420   GFRNONAA >60 04/12/2018 2216   GFRNONAA >60 06/27/2014 0420   GFRAA >60 04/12/2018 2216   GFRAA >60 06/27/2014 0420   CrCl cannot be calculated (Patient's most recent lab result is older than the maximum 21 days allowed.).  COAG Lab Results  Component Value Date   INR 1.2 06/06/2014    Radiology Ct Abdomen Pelvis W  Contrast  Result Date: 11/16/2018 CLINICAL DATA:  Sharp mid epigastric pain, nausea, bowel changes, history of ischemic colitis and pancreatitis EXAM: CT ABDOMEN AND PELVIS WITH CONTRAST TECHNIQUE: Multidetector CT imaging of the abdomen and pelvis was performed using the standard protocol following bolus administration of intravenous contrast. CONTRAST:  7mL OMNIPAQUE IOHEXOL 300 MG/ML SOLN, additional oral enteric contrast COMPARISON:  06/28/2014, MR abdomen, 06/22/2014 FINDINGS: Lower chest: No acute abnormality. Minimal bibasilar scarring and/or atelectasis. Hepatobiliary: No solid liver abnormality is seen. No gallstones, gallbladder wall thickening, or biliary dilatation. Pancreas: Unremarkable. No pancreatic ductal dilatation or surrounding inflammatory changes. Spleen: Normal in size without significant abnormality. Adrenals/Urinary Tract: Adrenal glands are unremarkable. Kidneys are normal, without renal calculi, solid lesion, or hydronephrosis. Bladder is unremarkable. Stomach/Bowel: Stomach is within normal limits. Appendix appears normal (series 5, image 60). No evidence of bowel wall thickening, distention, or inflammatory changes. Large burden of stool in the  colon. Vascular/Lymphatic: Hypodense appearance of edema and/or chronic soft tissue seen about the celiac axis is unchanged (series 3, image 16). No enlarged abdominal or pelvic lymph nodes. Reproductive: No mass or other significant abnormality. Tampon present in the vagina. Other: No abdominal wall hernia or abnormality. No abdominopelvic ascites. Musculoskeletal: No acute or significant osseous findings. Nonacute pars defects of L5 with approximately 25% anterolisthesis of L5 on S1. IMPRESSION: 1. No acute CT findings of the abdomen or pelvis to explain chest mid epigastric pain, nausea, or bowel changes. Large burden of stool in the colon. 2. No evidence of pancreatitis or colitis per stated clinical history. 3. Hypodense appearance of edema and/or chronic soft tissue seen about the celiac axis is unchanged in comparison to prior CT and MR exams and remains of uncertain significance (series 3, image 16). 4.  Other chronic and incidental findings as detailed above. These results will be called to the ordering clinician or representative by the Radiologist Assistant, and communication documented in the PACS or zVision Dashboard. Electronically Signed   By: Eddie Candle M.D.   On: 11/16/2018 10:02     Assessment/Plan 1. Abdominal pain, epigastric Recommend:  The patient does not have evidence of chronic asymptomatic mesenteric atherosclerosis.  Given the lack of vascular findings no intervention is warranted at this time.   No further invasive studies, angiography or surgery at this time  The patient should continue walking and begin a more formal exercise program.   Patient should follow up with me PRN I have recommended that she follow up with GI.    A total of 70 minutes was spent with this patient and greater than 50% was spent in counseling and coordination of care with the patient.  Discussion included the treatment options for vascular disease including indications for surgery and  intervention.  Also discussed is the appropriate timing of treatment.  In addition medical therapy was discussed.  2. Ischemic colitis ([place]) See #1    Hortencia Pilar, MD  11/21/2018 1:15 PM

## 2018-11-25 DIAGNOSIS — Z1159 Encounter for screening for other viral diseases: Secondary | ICD-10-CM | POA: Diagnosis not present

## 2018-11-26 DIAGNOSIS — R1012 Left upper quadrant pain: Secondary | ICD-10-CM | POA: Diagnosis not present

## 2018-11-26 DIAGNOSIS — Z8719 Personal history of other diseases of the digestive system: Secondary | ICD-10-CM | POA: Diagnosis not present

## 2018-11-26 DIAGNOSIS — G8929 Other chronic pain: Secondary | ICD-10-CM | POA: Diagnosis not present

## 2018-11-30 DIAGNOSIS — K559 Vascular disorder of intestine, unspecified: Secondary | ICD-10-CM | POA: Diagnosis not present

## 2018-11-30 DIAGNOSIS — R197 Diarrhea, unspecified: Secondary | ICD-10-CM | POA: Diagnosis not present

## 2018-11-30 DIAGNOSIS — R1032 Left lower quadrant pain: Secondary | ICD-10-CM | POA: Diagnosis not present

## 2018-11-30 DIAGNOSIS — K449 Diaphragmatic hernia without obstruction or gangrene: Secondary | ICD-10-CM | POA: Diagnosis not present

## 2018-11-30 DIAGNOSIS — R1012 Left upper quadrant pain: Secondary | ICD-10-CM | POA: Diagnosis not present

## 2018-12-02 DIAGNOSIS — K559 Vascular disorder of intestine, unspecified: Secondary | ICD-10-CM | POA: Diagnosis not present

## 2018-12-08 DIAGNOSIS — Z6823 Body mass index (BMI) 23.0-23.9, adult: Secondary | ICD-10-CM | POA: Diagnosis not present

## 2018-12-08 DIAGNOSIS — K559 Vascular disorder of intestine, unspecified: Secondary | ICD-10-CM | POA: Diagnosis not present

## 2019-01-05 ENCOUNTER — Ambulatory Visit: Payer: BC Managed Care – PPO | Admitting: Obstetrics and Gynecology

## 2019-01-05 ENCOUNTER — Other Ambulatory Visit: Payer: BC Managed Care – PPO

## 2019-02-09 ENCOUNTER — Encounter: Payer: Self-pay | Admitting: Obstetrics and Gynecology

## 2019-02-09 ENCOUNTER — Other Ambulatory Visit: Payer: Self-pay

## 2019-02-09 ENCOUNTER — Other Ambulatory Visit: Payer: BC Managed Care – PPO

## 2019-02-09 ENCOUNTER — Ambulatory Visit (INDEPENDENT_AMBULATORY_CARE_PROVIDER_SITE_OTHER): Payer: BC Managed Care – PPO | Admitting: Obstetrics and Gynecology

## 2019-02-09 VITALS — BP 100/60 | Ht 62.0 in | Wt 131.0 lb

## 2019-02-09 DIAGNOSIS — Z01419 Encounter for gynecological examination (general) (routine) without abnormal findings: Secondary | ICD-10-CM | POA: Diagnosis not present

## 2019-02-09 DIAGNOSIS — R1032 Left lower quadrant pain: Secondary | ICD-10-CM

## 2019-02-09 NOTE — Patient Instructions (Signed)
I value your feedback and entrusting us with your care. If you get a [facility] patient survey, I would appreciate you taking the time to let us know about your experience today. Thank you! 

## 2019-02-09 NOTE — Progress Notes (Signed)
PCP:  Chad Cordial, PA-C   Chief Complaint  Patient presents with  . Gynecologic Exam     HPI:      [name] is a 38 y.o. [phone] who LMP was Patient's last menstrual period was 01/28/2019., presents today for her annual examination.  Her menses are monthly, last 4-5 days, mod flow, no BTB. Has mild dysmen, improved with ibup.   Had LLQ pain issues 6/20 with neg GYN u/s except RTO 1.9 cm complex cyst, resolved on CT scan done a week later. Pt's sx improved so no need for f/u GYN u/s today. Pt saw GI and vascular surgery with neg eval.  Sex activity: single partner, contraception - vasectomy.  Mirena removed 2/20 due to possible cause of migraines. Migraines are improved.  Last Pap: November 02, 2015  Results were: no abnormalities /neg HPV DNA  Hx of STDs: none  There is no FH of breast cancer. There is no FH of ovarian cancer. The patient does occas do self-breast exams.   Tobacco use: The patient denies current or previous tobacco use. Alcohol use: rare No drug use.  Exercise: very active  She does get adequate calcium and Vitamin D in her diet.  I had been treating her for anxiety/depression but she wasn't getting sx relief and was having side effects. Pt being followed by PCP now with sx control.   Past Medical History:  Diagnosis Date  . Anxiety   . History of Papanicolaou smear of cervix 06/16/2011   -/- NEG  . Ischemic colitis ([place])    HOSPITALIZED 06/2014  . Migraine with aura   . Pancreatitis    POSS D/T WATER CONTAMINATION. PT HAS NOT HAD SXS SINCE CHANGING TO FILTERED/BOTTLED WATER  . Pars defect of lumbar spine     Past Surgical History:  Procedure Laterality Date  . ARTHROSCOPIC REPAIR ACL Left   . ELBOW SURGERY     LEFT  . WISDOM TOOTH EXTRACTION      Family History  Problem Relation Age of Onset  . Pancreatic cancer Paternal Grandfather   . Colon cancer Paternal Grandfather 63  . Cancer Paternal Grandfather        KIDNEY - UNKNOWN  TYPE  . Hypertension Maternal Grandmother   . Heart disease Maternal Grandfather   . Hypertension Maternal Grandfather     Social History   Socioeconomic History  . Marital status: Married    Spouse name: Not on file  . Number of children: 3  . Years of education: Not on file  . Highest education level: Not on file  Occupational History  . Not on file  Social Needs  . Financial resource strain: Not on file  . Food insecurity    Worry: Not on file    Inability: Not on file  . Transportation needs    Medical: Not on file    Non-medical: Not on file  Tobacco Use  . Smoking status: Never Smoker  . Smokeless tobacco: Never Used  Substance and Sexual Activity  . Alcohol use: No    Alcohol/week: 0.0 standard drinks  . Drug use: No  . Sexual activity: Yes    Birth control/protection: None  Lifestyle  . Physical activity    Days per week: 7 days    Minutes per session: Not on file  . Stress: Not on file  Relationships  . Social Herbalist on phone: Not on file    Gets together: Not  on file    Attends religious service: Not on file    Active member of club or organization: Not on file    Attends meetings of clubs or organizations: Not on file    Relationship status: Not on file  . Intimate partner violence    Fear of current or ex partner: Not on file    Emotionally abused: Not on file    Physically abused: Not on file    Forced sexual activity: Not on file  Other Topics Concern  . Not on file  Social History Narrative  . Not on file    Outpatient Medications Prior to Visit  Medication Sig Dispense Refill  . butalbital-acetaminophen-caffeine (FIORICET, ESGIC) 50-325-40 MG tablet Take 1 tablet by mouth every 6 (six) hours as needed for headache. 20 tablet 0  . cyclobenzaprine (FLEXERIL) 10 MG tablet Take 1 tablet (10 mg total) by mouth 3 (three) times daily as needed for muscle spasms. 21 tablet 0  . loratadine (CLARITIN) 10 MG tablet Take 10 mg by mouth  daily.    Marland Kitchen LORazepam (ATIVAN) 0.5 MG tablet Take 1 tablet (0.5 mg total) by mouth 2 (two) times daily. Prn sx 30 tablet 0  . zolpidem (AMBIEN) 5 MG tablet Take 1 tablet (5 mg total) by mouth at bedtime as needed for sleep. 30 tablet 0   No facility-administered medications prior to visit.       ROS:  Review of Systems  Constitutional: Negative for fatigue, fever and unexpected weight change.  Respiratory: Negative for cough, shortness of breath and wheezing.   Cardiovascular: Negative for chest pain, palpitations and leg swelling.  Gastrointestinal: Negative for blood in stool, constipation, diarrhea, nausea and vomiting.  Endocrine: Negative for cold intolerance, heat intolerance and polyuria.  Genitourinary: Negative for dyspareunia, dysuria, flank pain, frequency, genital sores, hematuria, menstrual problem, pelvic pain, urgency, vaginal bleeding, vaginal discharge and vaginal pain.  Musculoskeletal: Negative for back pain, joint swelling and myalgias.  Skin: Negative for rash.  Neurological: Positive for headaches. Negative for dizziness, syncope, light-headedness and numbness.  Hematological: Negative for adenopathy.  Psychiatric/Behavioral: Negative for agitation, confusion, sleep disturbance and suicidal ideas. The patient is not nervous/anxious.   BREAST: No symptoms   Objective: BP 100/60   Ht 5\' 2"  (1.575 m)   Wt 131 lb (59.4 kg)   LMP 01/28/2019   BMI 23.96 kg/m    Physical Exam Constitutional:      Appearance: She is well-developed.  Genitourinary:     Vulva, vagina, uterus, right adnexa and left adnexa normal.     No vulval lesion or tenderness noted.     No vaginal discharge, erythema or tenderness.     No cervical motion tenderness or polyp.     IUD strings visualized.     Uterus is not enlarged or tender.     No right or left adnexal mass present.     Right adnexa not tender.     Left adnexa not tender.  Neck:     Musculoskeletal: Normal range of  motion.     Thyroid: No thyromegaly.  Cardiovascular:     Rate and Rhythm: Normal rate and regular rhythm.     Heart sounds: Normal heart sounds. No murmur.  Pulmonary:     Effort: Pulmonary effort is normal.     Breath sounds: Normal breath sounds.  Chest:     Breasts:        Right: No mass, nipple discharge, skin change or tenderness.  Left: No mass, nipple discharge, skin change or tenderness.  Abdominal:     Palpations: Abdomen is soft.     Tenderness: There is no abdominal tenderness. There is no guarding.  Musculoskeletal: Normal range of motion.  Neurological:     General: No focal deficit present.     Mental Status: She is alert and oriented to person, place, and time.     Cranial Nerves: No cranial nerve deficit.  Skin:    General: Skin is warm and dry.  Psychiatric:        Mood and Affect: Mood normal.        Behavior: Behavior normal.        Thought Content: Thought content normal.        Judgment: Judgment normal.  Vitals signs reviewed.    Assessment/Plan: Encounter for annual routine gynecological examination  LLQ pain - Sx improved. F/u prn.          GYN counsel adequate intake of calcium and vitamin D, diet and exercise     F/U  Return in about 1 year (around 02/09/2020).  Alicia B. Copland, PA-C 02/09/2019 2:42 PM

## 2019-03-30 DIAGNOSIS — Z6823 Body mass index (BMI) 23.0-23.9, adult: Secondary | ICD-10-CM | POA: Diagnosis not present

## 2019-03-30 DIAGNOSIS — G43109 Migraine with aura, not intractable, without status migrainosus: Secondary | ICD-10-CM | POA: Diagnosis not present

## 2019-03-30 DIAGNOSIS — K559 Vascular disorder of intestine, unspecified: Secondary | ICD-10-CM | POA: Diagnosis not present

## 2019-03-30 DIAGNOSIS — F419 Anxiety disorder, unspecified: Secondary | ICD-10-CM | POA: Diagnosis not present

## 2019-04-26 DIAGNOSIS — F419 Anxiety disorder, unspecified: Secondary | ICD-10-CM | POA: Diagnosis not present

## 2019-04-26 DIAGNOSIS — G43109 Migraine with aura, not intractable, without status migrainosus: Secondary | ICD-10-CM | POA: Diagnosis not present

## 2019-05-13 ENCOUNTER — Other Ambulatory Visit: Payer: Self-pay | Admitting: Obstetrics & Gynecology

## 2019-05-18 DIAGNOSIS — F40298 Other specified phobia: Secondary | ICD-10-CM | POA: Diagnosis not present

## 2019-05-18 DIAGNOSIS — F410 Panic disorder [episodic paroxysmal anxiety] without agoraphobia: Secondary | ICD-10-CM | POA: Diagnosis not present

## 2019-05-18 DIAGNOSIS — F419 Anxiety disorder, unspecified: Secondary | ICD-10-CM | POA: Diagnosis not present

## 2019-06-14 DIAGNOSIS — Z20828 Contact with and (suspected) exposure to other viral communicable diseases: Secondary | ICD-10-CM | POA: Diagnosis not present

## 2019-06-14 DIAGNOSIS — Z03818 Encounter for observation for suspected exposure to other biological agents ruled out: Secondary | ICD-10-CM | POA: Diagnosis not present

## 2019-06-14 DIAGNOSIS — U071 COVID-19: Secondary | ICD-10-CM | POA: Diagnosis not present

## 2019-06-30 DIAGNOSIS — F419 Anxiety disorder, unspecified: Secondary | ICD-10-CM | POA: Diagnosis not present

## 2019-07-01 ENCOUNTER — Other Ambulatory Visit: Payer: Self-pay

## 2019-07-01 ENCOUNTER — Encounter: Payer: Self-pay | Admitting: Advanced Practice Midwife

## 2019-07-01 ENCOUNTER — Ambulatory Visit (INDEPENDENT_AMBULATORY_CARE_PROVIDER_SITE_OTHER): Payer: BC Managed Care – PPO | Admitting: Advanced Practice Midwife

## 2019-07-01 VITALS — BP 120/80 | Ht 62.0 in | Wt 132.0 lb

## 2019-07-01 DIAGNOSIS — N925 Other specified irregular menstruation: Secondary | ICD-10-CM

## 2019-07-01 DIAGNOSIS — N6321 Unspecified lump in the left breast, upper outer quadrant: Secondary | ICD-10-CM | POA: Diagnosis not present

## 2019-07-01 NOTE — Progress Notes (Signed)
Patient ID: Ellen Blankenship, female   DOB: 1980/11/20, 39 y.o.   MRN: [id number]  Reason for Consult: Breast Mass   Subjective:     HPI:  Ellen Blankenship is a 39 y.o. female being seen for a lump on her left breast. She first noticed the lump around the end of December and then in the past couple of weeks it began to be tender when she touched the area. She has never had a mammogram. She denies any recent trauma to her breast or wearing a new bra. She has no significant family history of breast or ovarian cancer. There has been no other change in the lump since she first noticed it.   She also mentions that her last 2 periods have been irregular in their length, only lasting 1 or 2 days instead of her usual 3 or 4. She wonders if that could have anything to do with the breast lump. We discussed possible changes related to perimenopause or related to increased stress of the past year.  Past Medical History:  Diagnosis Date  . Anxiety   . History of Papanicolaou smear of cervix 06/16/2011   -/- NEG  . Ischemic colitis ([place])    HOSPITALIZED 06/2014  . Migraine with aura   . Pancreatitis    POSS D/T WATER CONTAMINATION. PT HAS NOT HAD SXS SINCE CHANGING TO FILTERED/BOTTLED WATER  . Pars defect of lumbar spine    Family History  Problem Relation Age of Onset  . Pancreatic cancer Paternal Grandfather   . Colon cancer Paternal Grandfather 81  . Cancer Paternal Grandfather        KIDNEY - UNKNOWN TYPE  . Hypertension Maternal Grandmother   . Heart disease Maternal Grandfather   . Hypertension Maternal Grandfather    Past Surgical History:  Procedure Laterality Date  . ARTHROSCOPIC REPAIR ACL Left   . ELBOW SURGERY     LEFT  . WISDOM TOOTH EXTRACTION      Short Social History:  Social History   Tobacco Use  . Smoking status: Never Smoker  . Smokeless tobacco: Never Used  Substance Use Topics  . Alcohol use: No    Alcohol/week: 0.0 standard drinks    Allergies  Allergen  Reactions  . Paroxetine Other (See Comments)    Non-allergic side effects Non-allergic side effects   . Sulfa Antibiotics     Other reaction(s): Other (See Comments) Blisters in eye with sulfa eye drops    Current Outpatient Medications  Medication Sig Dispense Refill  . busPIRone (BUSPAR) 5 MG tablet Take by mouth.    Marland Kitchen LORazepam (ATIVAN) 0.5 MG tablet Take 1 tablet (0.5 mg total) by mouth 2 (two) times daily. Prn sx 30 tablet 0  . cyclobenzaprine (FLEXERIL) 10 MG tablet Take 1 tablet (10 mg total) by mouth 3 (three) times daily as needed for muscle spasms. (Patient not taking: Reported on 07/01/2019) 21 tablet 0  . loratadine (CLARITIN) 10 MG tablet Take 10 mg by mouth daily.    Marland Kitchen zolpidem (AMBIEN) 5 MG tablet Take 1 tablet (5 mg total) by mouth at bedtime as needed for sleep. (Patient not taking: Reported on 07/01/2019) 30 tablet 0   No current facility-administered medications for this visit.    Review of Systems  Constitutional: Negative.   HENT: Negative.   Eyes: Negative.   Respiratory: Negative.   Cardiovascular: Negative.   Gastrointestinal: Negative.   Genitourinary: Negative.   Musculoskeletal: Negative.   Skin: Negative.  Neurological: Negative.   Endo/Heme/Allergies: Negative.   Psychiatric/Behavioral: Negative.   Breast: tender lump in left breast      Objective:  Objective   Vitals:   07/01/19 1136  BP: 120/80  Weight: 132 lb (59.9 kg)  Height: 5\' 2"  (1.575 m)   Body mass index is 24.14 kg/m. Constitutional: Well nourished, well developed female in no acute distress.  HEENT: normal Skin: Warm and dry.  Breast: Right breast: no masses, not tender to palpation, no skin changes. Left breast: lump approximately 4 cm x 3 cm at 2:00 o'clock next to and superior to areola, mildly tender to palpation, mobile, no skin changes Cardiovascular: Regular rate and rhythm.   Respiratory: Clear to auscultation bilateral. Normal respiratory effort Neuro: DTRs 2+,  Cranial nerves grossly intact Psych: Alert and Oriented x3. No memory deficits. Normal mood and affect.  MS: normal gait, normal bilateral lower extremity ROM/strength/stability.       Assessment/Plan:     39 yo G3 P17 female with lump of left breast  Imaging ordered: Diagnostic mammogram Ultrasound: limited of right and left breasts Follow up as needed   [facility] 07/01/2019, 1:32 PM

## 2019-07-04 ENCOUNTER — Telehealth: Payer: Self-pay | Admitting: Advanced Practice Midwife

## 2019-07-04 NOTE — Telephone Encounter (Signed)
Patient is aware of her time and date at [facility] breast center on 07/06/2019 @ 10:20am

## 2019-07-06 ENCOUNTER — Other Ambulatory Visit: Payer: BC Managed Care – PPO

## 2019-07-06 ENCOUNTER — Ambulatory Visit
Admission: RE | Admit: 2019-07-06 | Discharge: 2019-07-06 | Disposition: A | Discharge status: Home / Self Care | Payer: BC Managed Care – PPO | Source: Ambulatory Visit | Attending: Advanced Practice Midwife | Admitting: Advanced Practice Midwife

## 2019-07-06 DIAGNOSIS — N6321 Unspecified lump in the left breast, upper outer quadrant: Secondary | ICD-10-CM

## 2019-07-06 DIAGNOSIS — N6489 Other specified disorders of breast: Secondary | ICD-10-CM | POA: Diagnosis not present

## 2019-07-06 DIAGNOSIS — R922 Inconclusive mammogram: Secondary | ICD-10-CM | POA: Diagnosis not present

## 2019-08-25 DIAGNOSIS — F419 Anxiety disorder, unspecified: Secondary | ICD-10-CM | POA: Diagnosis not present

## 2020-04-20 ENCOUNTER — Emergency Department: Payer: BC Managed Care – PPO

## 2020-04-20 ENCOUNTER — Other Ambulatory Visit: Payer: Self-pay

## 2020-04-20 ENCOUNTER — Encounter: Payer: Self-pay | Admitting: Emergency Medicine

## 2020-04-20 ENCOUNTER — Emergency Department
Admission: EM | Admit: 2020-04-20 | Discharge: 2020-04-21 | Disposition: A | Discharge status: Home / Self Care | Payer: BC Managed Care – PPO | Attending: Emergency Medicine | Admitting: Emergency Medicine

## 2020-04-20 DIAGNOSIS — R109 Unspecified abdominal pain: Secondary | ICD-10-CM | POA: Diagnosis present

## 2020-04-20 DIAGNOSIS — K8500 Idiopathic acute pancreatitis without necrosis or infection: Secondary | ICD-10-CM | POA: Insufficient documentation

## 2020-04-20 DIAGNOSIS — R1084 Generalized abdominal pain: Secondary | ICD-10-CM

## 2020-04-20 LAB — COMPREHENSIVE METABOLIC PANEL
ALT: 13 U/L (ref 0–44)
AST: 19 U/L (ref 15–41)
Albumin: 4.8 g/dL (ref 3.5–5.0)
Alkaline Phosphatase: 36 U/L — ABNORMAL LOW (ref 38–126)
Anion gap: 10 (ref 5–15)
BUN: 20 mg/dL (ref 6–20)
CO2: 27 mmol/L (ref 22–32)
Calcium: 9.3 mg/dL (ref 8.9–10.3)
Chloride: 103 mmol/L (ref 98–111)
Creatinine, Ser: 0.83 mg/dL (ref 0.44–1.00)
GFR, Estimated: >60 mL/min (ref >60)
Glucose, Bld: 103 mg/dL — ABNORMAL HIGH (ref 70–99)
Potassium: 4 mmol/L (ref 3.5–5.1)
Sodium: 140 mmol/L (ref 135–145)
Total Bilirubin: 0.8 mg/dL (ref 0.3–1.2)
Total Protein: 7.9 g/dL (ref 6.5–8.1)

## 2020-04-20 LAB — URINALYSIS, COMPLETE (UACMP) WITH MICROSCOPIC
Bacteria, UA: NONE SEEN
Bilirubin Urine: NEGATIVE
Glucose, UA: NEGATIVE mg/dL
Hgb urine dipstick: NEGATIVE
Ketones, ur: NEGATIVE mg/dL
Leukocytes,Ua: NEGATIVE
Nitrite: NEGATIVE
Protein, ur: NEGATIVE mg/dL
Specific Gravity, Urine: 1.009 (ref 1.005–1.030)
pH: 8 (ref 5.0–8.0)

## 2020-04-20 LAB — CBC
HCT: 41.1 % (ref 36.0–46.0)
Hemoglobin: 14.4 g/dL (ref 12.0–15.0)
MCH: 32.4 pg (ref 26.0–34.0)
MCHC: 35 g/dL (ref 30.0–36.0)
MCV: 92.4 fL (ref 80.0–100.0)
Platelets: 244 10*3/uL (ref 150–400)
RBC: 4.45 MIL/uL (ref 3.87–5.11)
RDW: 12 % (ref 11.5–15.5)
WBC: 6.3 10*3/uL (ref 4.0–10.5)
nRBC: 0 % (ref 0.0–0.2)

## 2020-04-20 LAB — POC URINE PREG, ED: Preg Test, Ur: NEGATIVE

## 2020-04-20 LAB — LIPASE, BLOOD: Lipase: 55 U/L — ABNORMAL HIGH (ref 11–51)

## 2020-04-20 LAB — LACTIC ACID, PLASMA: Lactic Acid, Venous: 1.4 mmol/L (ref 0.5–1.9)

## 2020-04-20 MED ORDER — ONDANSETRON 4 MG PO TBDP: 4.0000 mg | ORAL_TABLET | Freq: Three times a day (TID) | ORAL | 0 refills | Status: DC | PRN

## 2020-04-20 MED ORDER — ONDANSETRON HCL 4 MG/2ML IJ SOLN
4.0000 mg | Freq: Once | INTRAMUSCULAR | Status: AC
  Administered 2020-04-20: 4 mg via INTRAVENOUS
  Filled 2020-04-20: qty 2

## 2020-04-20 MED ORDER — MORPHINE SULFATE (PF) 4 MG/ML IV SOLN
4.0000 mg | Freq: Once | INTRAVENOUS | Status: AC
  Administered 2020-04-20: 4 mg via INTRAVENOUS
  Filled 2020-04-20: qty 1

## 2020-04-20 MED ORDER — IOHEXOL 350 MG/ML SOLN
100.0000 mL | Freq: Once | INTRAVENOUS | Status: AC | PRN
  Administered 2020-04-20: 100 mL via INTRAVENOUS

## 2020-04-20 MED ORDER — OXYCODONE HCL 5 MG PO TABS
5.0000 mg | ORAL_TABLET | Freq: Four times a day (QID) | ORAL | 0 refills | Status: AC | PRN
Start: 2020-04-20 — End: 2021-04-20

## 2020-04-20 NOTE — ED Notes (Signed)
Pt given graham crackers and water for PO challenge.  Pt attempting to eat, pt states "I am trying but it still hurts".

## 2020-04-20 NOTE — ED Notes (Signed)
To CT

## 2020-04-20 NOTE — ED Triage Notes (Signed)
Pt to ER with c/o LUQ abdominal pain that radiates into back.  Pt states pain is worse after eating.  Pt states nausea, denies vomiting.  Pt states she feels bloated, denies diarrhea.

## 2020-04-20 NOTE — ED Notes (Signed)
Pt reports pain as 3/10 after medications, lying on stretcher in hallway, talking on phone.

## 2020-04-20 NOTE — ED Notes (Signed)
Returned from CT.

## 2020-04-20 NOTE — Discharge Instructions (Addendum)
You were seen in the ED because of your abdominal pain.  As we discussed, he did not have evidence of ischemic colitis.  You have evidence of mild pancreatitis.  I have sent a prescription for Zofran to the [facility] to help control your nausea at home.  Take oxycodone as prescribed for severe pain. Do not drink alcohol, drive or participate in any other potentially dangerous activities while taking this medication as it may make you sleepy. Do not take this medication with any other sedating medications, either prescription or over-the-counter.    This medication is an opiate (or narcotic) pain medication and can be habit forming.  Use it as little as possible to achieve adequate pain control.  Do not use or use it with extreme caution if you have a history of opiate abuse or dependence.  If you are on a pain contract with your primary care doctor or a pain specialist, be sure to let them know you were prescribed this medication today from the [facility] Emergency Department.  This medication is intended for your use only - do not give any to anyone else and keep it in a secure place where nobody else, especially children, have access to it.  It will also cause or worsen constipation, so you may want to consider taking an over-the-counter stool softener while you are taking this medication.   I have attached some information regarding pancreatitis and keeping a bland diet as the inflammation dies down around your pancreas.  If you develop any significantly worsening symptoms, fever or passing out with your symptoms, please return to the ED.

## 2020-04-20 NOTE — ED Provider Notes (Signed)
[facility] Emergency Department Provider Note ____________________________________________   First MD Initiated Contact with Patient 04/20/20 1929     (approximate)  I have reviewed the triage vital signs and the nursing notes.  HISTORY  Chief Complaint Abdominal Pain   HPI Ellen Blankenship is a 39 y.o. femalewho presents to the ED for evaluation of abdominal pain.   Chart review indicates remote history of ischemic colitis requiring ICU admission but no surgical intervention. Ischemic colitis to the distal transverse colon splenic flexure with arteriogram showing an absent main artery to the distal transverse colon. Vascular surgery recommended resection if this recurred, but no procedure at that time. Marland Kitchen  History of idiopathic pancreatitis.  Patient presents to the ED with about 24 hours of epigastric/LUQ pain radiating to her back with associated nausea. She reports the pain is constant, but comes in waves of worsening up to 10/10 intensity. Is currently 6/10 intensity and radiates to her back, worsened postprandially. She denies any diarrhea or stool changes, denies dysuria, denies vaginal discharge or bleeding. Denies any shortness of breath or cough or upper chest pains. Indicates this is similar to previous episodes of pancreatitis, and does not feel as severe as her ischemic colitis.   Past Medical History:  Diagnosis Date  . Anxiety   . History of Papanicolaou smear of cervix 06/16/2011   -/- NEG  . Ischemic colitis ([place])    HOSPITALIZED 06/2014  . Migraine with aura   . Pancreatitis    POSS D/T WATER CONTAMINATION. PT HAS NOT HAD SXS SINCE CHANGING TO FILTERED/BOTTLED WATER  . Pars defect of lumbar spine     Patient Active Problem List   Diagnosis Date Noted  . Abdominal pain, epigastric 11/21/2018  . Ischemic colitis ([place]) 11/21/2018  . Right ovarian cyst 11/10/2018  . Anxiety and depression 12/16/2016  . Anxiety 11/14/2016  . Visual field  defect 08/03/2015  . History of ischemic bowel disease 08/03/2015  . Pars defect of lumbar spine 08/03/2015    Past Surgical History:  Procedure Laterality Date  . ARTHROSCOPIC REPAIR ACL Left   . ELBOW SURGERY     LEFT  . WISDOM TOOTH EXTRACTION      Prior to Admission medications   Medication Sig Start Date End Date Taking? Authorizing Provider  cyclobenzaprine (FLEXERIL) 10 MG tablet Take 1 tablet (10 mg total) by mouth 3 (three) times daily as needed for muscle spasms. Patient not taking: Reported on 07/01/2019 05/09/16   Hagler, Jami L, PA-C  loratadine (CLARITIN) 10 MG tablet Take 10 mg by mouth daily.    [provider]  LORazepam (ATIVAN) 0.5 MG tablet Take 1 tablet (0.5 mg total) by mouth 2 (two) times daily. Prn sx 06/20/12   Copland, Elmo Putt B, PA-C  ondansetron (ZOFRAN ODT) 4 MG disintegrating tablet Take 1 tablet (4 mg total) by mouth every 8 (eight) hours as needed for nausea or vomiting. 04/20/20   Vladimir Crofts, MD  propranolol (INDERAL) 10 MG tablet Take by mouth. 07/01/19 06/30/20  [provider]  zolpidem (AMBIEN) 5 MG tablet Take 1 tablet (5 mg total) by mouth at bedtime as needed for sleep. Patient not taking: Reported on 07/01/2019 7/82/95   Copland, [name], PA-C    Allergies Venlafaxine, Paroxetine, and Sulfa antibiotics  Family History  Problem Relation Age of Onset  . Pancreatic cancer Paternal Grandfather   . Colon cancer Paternal Grandfather 19  . Cancer Paternal Grandfather  KIDNEY - UNKNOWN TYPE  . Hypertension Maternal Grandmother   . Heart disease Maternal Grandfather   . Hypertension Maternal Grandfather   . Breast cancer Neg Hx     Social History Social History   Tobacco Use  . Smoking status: Never Smoker  . Smokeless tobacco: Never Used  Vaping Use  . Vaping Use: Never used  Substance Use Topics  . Alcohol use: No    Alcohol/week: 0.0 standard drinks  . Drug use: No    Review of Systems  Constitutional: No  fever/chills Eyes: No visual changes. ENT: No sore throat. Cardiovascular: Denies chest pain. Respiratory: Denies shortness of breath. Gastrointestinal: Positive for abdominal pain and nausea. no vomiting.  No diarrhea.  No constipation. Genitourinary: Negative for dysuria. Musculoskeletal: Negative for back pain. Skin: Negative for rash. Neurological: Negative for headaches, focal weakness or numbness.  ____________________________________________   PHYSICAL EXAM:  VITAL SIGNS: Vitals:   04/20/20 1834 04/20/20 2130  BP: 128/84 114/71  Pulse: 65 (!) 50  Resp: 18 18  Temp: 98.8 F (37.1 C)   SpO2: 99% 97%      Constitutional: Alert and oriented. Well appearing and in no acute distress. Eyes: Conjunctivae are normal. PERRL. EOMI. Head: Atraumatic. Nose: No congestion/rhinnorhea. Mouth/Throat: Mucous membranes are moist.  Oropharynx non-erythematous. Neck: No stridor. No cervical spine tenderness to palpation. Cardiovascular: Normal rate, regular rhythm. Grossly normal heart sounds.  Good peripheral circulation. Respiratory: Normal respiratory effort.  No retractions. Lungs CTAB. Gastrointestinal: Soft , nondistended.  Epigastric and LUQ tenderness to palpation without overlying skin changes. No peritoneal features. Lower quadrants are benign. No discrete RUQ tenderness. Musculoskeletal: No lower extremity tenderness nor edema.  No joint effusions. No signs of acute trauma. Neurologic:  Normal speech and language. No gross focal neurologic deficits are appreciated. No gait instability noted. Skin:  Skin is warm, dry and intact. No rash noted. Psychiatric: Mood and affect are normal. Speech and behavior are normal.  ____________________________________________   LABS (all labs ordered are listed, but only abnormal results are displayed)  Labs Reviewed  LIPASE, BLOOD - Abnormal; Notable for the following components:      Result Value   Lipase 55 (*)    All other  components within normal limits  COMPREHENSIVE METABOLIC PANEL - Abnormal; Notable for the following components:   Glucose, Bld 103 (*)    Alkaline Phosphatase 36 (*)    All other components within normal limits  URINALYSIS, COMPLETE (UACMP) WITH MICROSCOPIC - Abnormal; Notable for the following components:   Color, Urine STRAW (*)    APPearance CLEAR (*)    All other components within normal limits  CBC  LACTIC ACID, PLASMA  POC URINE PREG, ED  TROPONIN I (HIGH SENSITIVITY)  TROPONIN I (HIGH SENSITIVITY)   ____________________________________________  12 Lead EKG   ____________________________________________  RADIOLOGY  ED MD interpretation:    Official radiology report(s): CT Angio Abd/Pel W and/or Wo Contrast  Result Date: 04/20/2020 CLINICAL DATA:  39 year old female with concern for mesenteric ischemia. EXAM: CTA ABDOMEN AND PELVIS WITHOUT AND WITH CONTRAST TECHNIQUE: Multidetector CT imaging of the abdomen and pelvis was performed using the standard protocol during bolus administration of intravenous contrast. Multiplanar reconstructed images and MIPs were obtained and reviewed to evaluate the vascular anatomy. CONTRAST:  122mL OMNIPAQUE IOHEXOL 350 MG/ML SOLN COMPARISON:  CT abdomen pelvis dated 11/16/2018. FINDINGS: VASCULAR Aorta: Normal caliber aorta without aneurysm, dissection, vasculitis or significant stenosis. Celiac: Patent without evidence of aneurysm, dissection, vasculitis or significant stenosis. SMA:  Patent without evidence of aneurysm, dissection, vasculitis or significant stenosis. Renals: Both renal arteries are patent without evidence of aneurysm, dissection, vasculitis, fibromuscular dysplasia or significant stenosis. IMA: Patent without evidence of aneurysm, dissection, vasculitis or significant stenosis. Inflow: Patent without evidence of aneurysm, dissection, vasculitis or significant stenosis. Proximal Outflow: Bilateral common femoral and visualized  portions of the superficial and profunda femoral arteries are patent without evidence of aneurysm, dissection, vasculitis or significant stenosis. Veins: The IVC is unremarkable. The SMV, splenic vein, and main portal vein patent. No portal venous gas. Review of the MIP images confirms the above findings. NON-VASCULAR Lower chest: Minimal bibasilar dependent atelectasis. No intra-abdominal free air or free fluid. Hepatobiliary: No focal liver abnormality is seen. No gallstones, gallbladder wall thickening, or biliary dilatation. Pancreas: Unremarkable. No pancreatic ductal dilatation or surrounding inflammatory changes. Spleen: Normal in size without focal abnormality. Adrenals/Urinary Tract: The adrenal glands unremarkable. There is no hydronephrosis on either side. There is symmetric enhancement of the renal parenchyma. The visualized ureters and urinary bladder appear unremarkable. Stomach/Bowel: There is moderate stool throughout the colon. There is no bowel obstruction or active inflammation. The appendix is normal. Lymphatic: There is low attenuating hypodense tissue surrounding the celiac axis similar to prior CT and posterior to the pancreas extending to the gastric [place] space. There is encasement of the celiac axis and its branches without compression or mass effect. This measures approximately 3.5 x 2.7 cm in greatest axial dimensions (36/4. This may represent edema in the mesenteric root or lymphoproliferative tissue. MRI may provide better evaluation. Reproductive: The uterus is grossly unremarkable no adnexal masses. Other: There is diastasis of anterior abdominal wall musculature in the midline. Musculoskeletal: Bilateral L5 pars defects. There is grade 1 L5-S1 anterolisthesis. No acute osseous pathology. IMPRESSION: 1. No acute intra-abdominal or pelvic pathology. No CT evidence of mesenteric ischemia. 2. Low attenuating hypodense tissue surrounding the celiac axis similar or minimally increased  compared to the prior CT. This may represent edema in the mesenteric root or lymphoproliferative tissue. MRI may provide better evaluation. 3. No bowel obstruction. Normal appendix. Electronically Signed   By: Anner Crete M.D.   On: 04/20/2020 21:51    ____________________________________________   PROCEDURES and INTERVENTIONS  Procedure(s) performed (including Critical Care):  .1-3 Lead EKG Interpretation Performed by: Vladimir Crofts, MD Authorized by: Vladimir Crofts, MD     Interpretation: normal     ECG rate:  60   ECG rate assessment: normal     Rhythm: sinus rhythm     Ectopy: none     Conduction: normal      Medications  ondansetron (ZOFRAN) injection 4 mg (4 mg Intravenous Given 04/20/20 2020)  morphine 4 MG/ML injection 4 mg (4 mg Intravenous Given 04/20/20 2020)  iohexol (OMNIPAQUE) 350 MG/ML injection 100 mL (100 mLs Intravenous Contrast Given 04/20/20 2107)    ____________________________________________   MDM / ED COURSE   39 year old female with history of ischemic colitis and idiopathic pancreatitis presents to the ED with acute epigastric pain consistent with a mild case Repatha pancreatitis amenable to outpatient management.  Normal vitals on room air.  Exam with tenderness to the LUQ and epigastrium without peritoneal features.  She otherwise looks well.  Blood work with very mild elevation of lipase to 55, otherwise unremarkable.  She is a history of aberrant anatomy to her distal transverse colon and a previous episode of ischemic colitis.  To assess for this, CT abdomen/pelvis was performed and does not show evidence of ischemia  and does not show evidence of pseudocyst around her pancreas.  Her pain is improved and she is tolerating p.o. intake here in the ED.  I see no barriers to outpatient management.  We discussed return precautions for the ED.  Patient medically stable for discharge home.   Clinical Course as of Apr 20 2210  Fri Apr 20, 2020  2208  Reassessed.  Patient reports improving pain.  We discussed CT imaging without acute ischemic pathology or evidence of pseudocyst around her pancreas.  We discussed management of pancreatitis as an outpatient and we discussed return precautions for the ED.   [DS]    Clinical Course User Index [DS] Vladimir Crofts, MD    ____________________________________________   FINAL CLINICAL IMPRESSION(S) / ED DIAGNOSES  Final diagnoses:  Generalized abdominal pain  Idiopathic acute pancreatitis without infection or necrosis     ED Discharge Orders         Ordered    ondansetron (ZOFRAN ODT) 4 MG disintegrating tablet  Every 8 hours PRN        04/20/20 2210           Terrionna Bridwell   Note:  This document was prepared using Dragon voice recognition software and may include unintentional dictation errors.   Vladimir Crofts, MD 04/20/20 2213

## 2020-04-21 ENCOUNTER — Encounter (HOSPITAL_COMMUNITY): Payer: Self-pay | Admitting: Pharmacist

## 2020-04-21 LAB — TROPONIN I (HIGH SENSITIVITY): Troponin I (High Sensitivity): 3 ng/L (ref <18)

## 2020-04-21 MED ORDER — OXYCODONE-ACETAMINOPHEN 5-325 MG PO TABS
2.0000 | ORAL_TABLET | Freq: Four times a day (QID) | ORAL | 0 refills | Status: DC | PRN
Start: 2020-04-21 — End: 2020-04-21

## 2020-04-21 MED ORDER — OXYCODONE-ACETAMINOPHEN 5-325 MG PO TABS
2.0000 | ORAL_TABLET | Freq: Once | ORAL | Status: AC
  Administered 2020-04-21: 2 via ORAL
  Filled 2020-04-21: qty 2

## 2021-03-21 ENCOUNTER — Telehealth: Payer: Self-pay

## 2021-03-21 NOTE — Telephone Encounter (Signed)
Pt calling; saw her PCP earlier this week; was ordered f/u u/s at [facility] since PCP is part of [facility]; they cannot see her until 04/29/21;  she thinks PCP is looking for a hernia; u/s ordered:  endovaginal non-ob u/s and a pelvis limited u/s.  If we can see her before 11/28 for u/s, what does she need to do to get the referral to [place]?  [phone]

## 2021-03-21 NOTE — Telephone Encounter (Signed)
Not something we can do in our office. Recommended pt let PCP know she wants imaging done at [facility] in [place] (which is a [employer]) and they can prob get her in sooner. PCP has to do the referral/order. Thx

## 2021-03-21 NOTE — Telephone Encounter (Signed)
Please advise 

## 2021-03-22 NOTE — Telephone Encounter (Signed)
Called and left voicemail for patient to call back.

## 2021-06-23 ENCOUNTER — Emergency Department: Payer: Managed Care, Other (non HMO)

## 2021-06-23 ENCOUNTER — Emergency Department
Admission: EM | Admit: 2021-06-23 | Discharge: 2021-06-24 | Disposition: A | Discharge status: Home / Self Care | Payer: Managed Care, Other (non HMO) | Source: Home / Self Care | Attending: Emergency Medicine | Admitting: Emergency Medicine

## 2021-06-23 ENCOUNTER — Other Ambulatory Visit: Payer: Self-pay

## 2021-06-23 DIAGNOSIS — K859 Acute pancreatitis without necrosis or infection, unspecified: Secondary | ICD-10-CM | POA: Diagnosis not present

## 2021-06-23 DIAGNOSIS — K8500 Idiopathic acute pancreatitis without necrosis or infection: Secondary | ICD-10-CM | POA: Diagnosis not present

## 2021-06-23 LAB — URINALYSIS, ROUTINE W REFLEX MICROSCOPIC
Bilirubin Urine: NEGATIVE
Glucose, UA: NEGATIVE mg/dL
Ketones, ur: NEGATIVE mg/dL
Nitrite: NEGATIVE
Protein, ur: NEGATIVE mg/dL
Specific Gravity, Urine: 1.023 (ref 1.005–1.030)
pH: 5 (ref 5.0–8.0)

## 2021-06-23 LAB — COMPREHENSIVE METABOLIC PANEL
ALT: 9 U/L (ref 0–44)
AST: 19 U/L (ref 15–41)
Albumin: 4.4 g/dL (ref 3.5–5.0)
Alkaline Phosphatase: 47 U/L (ref 38–126)
Anion gap: 10 (ref 5–15)
BUN: 22 mg/dL — ABNORMAL HIGH (ref 6–20)
CO2: 24 mmol/L (ref 22–32)
Calcium: 9.5 mg/dL (ref 8.9–10.3)
Chloride: 104 mmol/L (ref 98–111)
Creatinine, Ser: 0.78 mg/dL (ref 0.44–1.00)
GFR, Estimated: >60 mL/min (ref >60)
Glucose, Bld: 119 mg/dL — ABNORMAL HIGH (ref 70–99)
Potassium: 3.5 mmol/L (ref 3.5–5.1)
Sodium: 138 mmol/L (ref 135–145)
Total Bilirubin: 0.5 mg/dL (ref 0.3–1.2)
Total Protein: 7.7 g/dL (ref 6.5–8.1)

## 2021-06-23 LAB — CBC
HCT: 40.8 % (ref 36.0–46.0)
Hemoglobin: 14.4 g/dL (ref 12.0–15.0)
MCH: 31.9 pg (ref 26.0–34.0)
MCHC: 35.3 g/dL (ref 30.0–36.0)
MCV: 90.3 fL (ref 80.0–100.0)
Platelets: 279 10*3/uL (ref 150–400)
RBC: 4.52 MIL/uL (ref 3.87–5.11)
RDW: 11.9 % (ref 11.5–15.5)
WBC: 7.7 10*3/uL (ref 4.0–10.5)
nRBC: 0 % (ref 0.0–0.2)

## 2021-06-23 LAB — POC URINE PREG, ED: Preg Test, Ur: NEGATIVE

## 2021-06-23 LAB — LIPASE, BLOOD: Lipase: 65 U/L — ABNORMAL HIGH (ref 11–51)

## 2021-06-23 MED ORDER — ONDANSETRON 4 MG PO TBDP
4.0000 mg | ORAL_TABLET | Freq: Once | ORAL | Status: AC | PRN
  Administered 2021-06-23: 4 mg via ORAL

## 2021-06-23 MED ORDER — ONDANSETRON HCL 4 MG/2ML IJ SOLN
4.0000 mg | Freq: Once | INTRAMUSCULAR | Status: AC
  Administered 2021-06-24: 4 mg via INTRAVENOUS
  Filled 2021-06-23: qty 2

## 2021-06-23 MED ORDER — IOHEXOL 300 MG/ML  SOLN
100.0000 mL | Freq: Once | INTRAMUSCULAR | Status: DC | PRN
  Filled 2021-06-23: qty 100

## 2021-06-23 MED ORDER — FENTANYL CITRATE PF 50 MCG/ML IJ SOSY
50.0000 ug | PREFILLED_SYRINGE | Freq: Once | INTRAMUSCULAR | Status: AC
  Administered 2021-06-24: 50 ug via INTRAVENOUS
  Filled 2021-06-23: qty 1

## 2021-06-23 MED ORDER — ONDANSETRON 4 MG PO TBDP
ORAL_TABLET | ORAL | Status: AC
  Filled 2021-06-23: qty 1

## 2021-06-23 NOTE — ED Triage Notes (Addendum)
Pt to ED POV c/o severe abdominal pain and nausea that started approximately 15 minutes prior to arrival.  Pt states this feels similar to pancreatitis she has had in the past.  Pt states she is on day 8 of having covid.

## 2021-06-24 MED ORDER — ACETAMINOPHEN 500 MG PO TABS
1000.0000 mg | ORAL_TABLET | Freq: Once | ORAL | Status: AC
Start: 2021-06-24 — End: 2021-06-24
  Administered 2021-06-24: 1000 mg via ORAL
  Filled 2021-06-24: qty 2

## 2021-06-24 MED ORDER — IOHEXOL 350 MG/ML SOLN
100.0000 mL | Freq: Once | INTRAVENOUS | Status: AC | PRN
  Administered 2021-06-24: 100 mL via INTRAVENOUS
  Filled 2021-06-24: qty 100

## 2021-06-24 MED ORDER — OXYCODONE-ACETAMINOPHEN 5-325 MG PO TABS: 1.0000 | ORAL_TABLET | ORAL | 0 refills | Status: DC | PRN

## 2021-06-24 MED ORDER — OXYCODONE HCL 5 MG PO TABS
5.0000 mg | ORAL_TABLET | Freq: Once | ORAL | Status: AC
  Administered 2021-06-24: 5 mg via ORAL
  Filled 2021-06-24: qty 1

## 2021-06-24 MED ORDER — ONDANSETRON HCL 4 MG PO TABS: 4.0000 mg | ORAL_TABLET | Freq: Every day | ORAL | 1 refills | Status: AC | PRN

## 2021-06-24 NOTE — Discharge Instructions (Signed)
As we discussed, per the radiologist who read your CT, you should be evaluated for sarcoidosis or amyloidosis.  I have submitted a urgent referral to [facility]. Make sure to contact the office in the morning for a follow-up with.  In the meantime please follow the attached guidelines for bland diet for pancreatitis.  Take Percocet as needed for pain and Zofran as needed for nausea.  Return to the emergency room for new or worsening abdominal pain, fever or chills.

## 2021-06-24 NOTE — ED Provider Notes (Signed)
[facility] Provider Note    Event Date/Time   First MD Initiated Contact with Patient 06/23/21 2319     (approximate)   History   Abdominal Pain   HPI  Ellen Blankenship is a 41 y.o. female with a history of ischemic colitis and idiopathic pancreatitis who presents for evaluation of epigastric abdominal pain.  Patient reports that she had a normal day and this evening after eating dinner she developed sudden onset of severe sharp epigastric abdominal pain radiating to her back.  The pain is identical to her prior episodes of a pancreatitis.  She is complaining of severe pain and nausea.  No vomiting, no diarrhea constipation.  No chest pain or shortness of breath.  Patient recently diagnosed with COVID a week ago.     Past Medical History:  Diagnosis Date   Anxiety    History of Papanicolaou smear of cervix 06/16/2011   -/- NEG   Ischemic colitis ([place])    HOSPITALIZED 06/2014   Migraine with aura    Pancreatitis    POSS D/T WATER CONTAMINATION. PT HAS NOT HAD SXS SINCE CHANGING TO FILTERED/BOTTLED WATER   Pars defect of lumbar spine     Past Surgical History:  Procedure Laterality Date   ARTHROSCOPIC REPAIR ACL Left    ELBOW SURGERY     LEFT   WISDOM TOOTH EXTRACTION       Physical Exam   Triage Vital Signs: ED Triage Vitals [06/23/21 2051]  Enc Vitals Group     BP (!) 153/97     Pulse Rate 75     Resp 20     Temp 98.7 F (37.1 C)     Temp Source Oral     SpO2 97 %     Weight 135 lb (61.2 kg)     Height 5' 2" (1.575 m)     Head Circumference      Peak Flow      Pain Score 9     Pain Loc      Pain Edu?      Excl. in [place]?     Most recent vital signs: Vitals:   06/23/21 2051 06/23/21 2339  BP: (!) 153/97 128/73  Pulse: 75 63  Resp: 20 17  Temp: 98.7 F (37.1 C)   SpO2: 97% 99%     Constitutional: Alert and oriented. Well appearing and in no apparent distress. HEENT:      Head: Normocephalic and atraumatic.         Eyes:  Conjunctivae are normal. Sclera is non-icteric.       Mouth/Throat: Mucous membranes are moist.       Neck: Supple with no signs of meningismus. Cardiovascular: Regular rate and rhythm. No murmurs, gallops, or rubs. 2+ symmetrical distal pulses are present in all extremities.  Respiratory: Normal respiratory effort. Lungs are clear to auscultation bilaterally.  Gastrointestinal: Soft, tender to palpation in the epigastric region, and non distended with positive bowel sounds. No rebound or guarding. Genitourinary: No CVA tenderness. Musculoskeletal:  No edema, cyanosis, or erythema of extremities. Neurologic: Normal speech and language. Face is symmetric. Moving all extremities. No gross focal neurologic deficits are appreciated. Skin: Skin is warm, dry and intact. No rash noted. Psychiatric: Mood and affect are normal. Speech and behavior are normal.  ED Results / Procedures / Treatments   Labs (all labs ordered are listed, but only abnormal results are displayed) Labs Reviewed  LIPASE, BLOOD - Abnormal; Notable  for the following components:      Result Value   Lipase 65 (*)    All other components within normal limits  COMPREHENSIVE METABOLIC PANEL - Abnormal; Notable for the following components:   Glucose, Bld 119 (*)    BUN 22 (*)    All other components within normal limits  URINALYSIS, ROUTINE W REFLEX MICROSCOPIC - Abnormal; Notable for the following components:   Color, Urine YELLOW (*)    APPearance CLEAR (*)    Hgb urine dipstick SMALL (*)    Leukocytes,Ua TRACE (*)    Bacteria, UA RARE (*)    All other components within normal limits  CBC  POC URINE PREG, ED     EKG  none   RADIOLOGY I, Rudene Re, attending MD, have personally viewed and interpreted the images obtained during this visit as below:  CT with some inflammation around the pancreas   ___________________________________________________ Interpretation by Radiologist:  CT Angio Abd/Pel w/  and/or w/o  Result Date: 06/24/2021 CLINICAL DATA:  Pancreatitis, acute, severe. Nausea, COVID pneumonia EXAM: CTA ABDOMEN AND PELVIS WITH CONTRAST TECHNIQUE: Multidetector CT imaging of the abdomen and pelvis was performed using the standard protocol during bolus administration of intravenous contrast. Multiplanar reconstructed images and MIPs were obtained and reviewed to evaluate the vascular anatomy. RADIATION DOSE REDUCTION: This exam was performed according to the departmental dose-optimization program which includes automated exposure control, adjustment of the mA and/or kV according to patient size and/or use of iterative reconstruction technique. CONTRAST:  155m OMNIPAQUE IOHEXOL 350 MG/ML SOLN COMPARISON:  04/20/2020 FINDINGS: VASCULAR Aorta: Normal caliber aorta without aneurysm, dissection, vasculitis or significant stenosis. Celiac: Variant anatomy with separate origin of the left gastric artery directly from the aorta. Accessory left hepatic artery. The celiac axis is otherwise unremarkable. SMA: Patent without evidence of aneurysm, dissection, vasculitis or significant stenosis. Renals: Dual right and single left renal arteries are widely patent. Normal vascular morphology. No aneurysm or dissection. IMA: Patent without evidence of aneurysm, dissection, vasculitis or significant stenosis. Inflow: Patent without evidence of aneurysm, dissection, vasculitis or significant stenosis. Proximal Outflow: Bilateral common femoral and visualized portions of the superficial and profunda femoral arteries are patent without evidence of aneurysm, dissection, vasculitis or significant stenosis. Veins: Ileo caval venous system, renal veins, splenic vein, superior mesenteric vein, portal vein, and hepatic veins are all widely patent. Review of the MIP images confirms the above findings. NON-VASCULAR Lower chest: There is irregular interlobular septal thickening noted within the visualized lung bases which appears  stable since prior examination, but progressive since remote prior examination of 11/16/2018. This may reflect an infiltrative process as can be seen with sarcoidosis or less likely, amyloidosis. Malignancies such as lymphoma are considered less likely given the indolent nature of this process. Hepatobiliary: No focal liver abnormality is seen. No gallstones, gallbladder wall thickening, or biliary dilatation. Pancreas: Normal enhancement of the pancreatic parenchyma which appears generous. No intraparenchymal masses or calcifications. The pancreatic duct is not dilated. There is infiltrative soft tissue again identified within the peripancreatic soft tissues extending into the mesenteric root, extending into the retroperitoneum surrounding the left renal vein, and extending superiorly into the gastrohepatic ligament and surrounding the celiac axis. This appears similar to prior examination. This could reflect changes of acute interstitial/edematous pancreatitis. Alternatively, given its similar appearance, these findings may reflect changes of autoimmune pancreatitis or retroperitoneal fibrosis. Spleen: Unremarkable Adrenals/Urinary Tract: Adrenal glands are unremarkable. Kidneys are normal, without renal calculi, focal lesion, or hydronephrosis. Bladder is unremarkable.  Stomach/Bowel: Stomach is within normal limits. Appendix appears normal. No evidence of bowel wall thickening, distention, or inflammatory changes. Lymphatic: No pathologic adenopathy within the abdomen and pelvis. Reproductive: Uterus and bilateral adnexa are unremarkable. Other: Diastasis of the rectus abdominus musculature. Rectum unremarkable. Musculoskeletal: No acute bone abnormality. L5 pars defects present with grade 1-2 anterolisthesis of L5 upon S1, stable since prior examination. IMPRESSION: VASCULAR Normal examination of the abdominal vasculature NON-VASCULAR Persistent soft tissue infiltration within the retroperitoneum within the  peripancreatic soft tissues, similar to prior examination. This may represent recurrent acute/interstitial pancreatitis or, given its similarity to prior examination, changes related to autoimmune pancreatitis or retroperitoneal fibrosis. Progressive irregular inter left knee or septal thickening within the visualized lung bases. This may reflect an underlying infiltrative condition such as sarcoidosis or, less likely, amyloidosis. Electronically Signed   By: Fidela Salisbury M.D.   On: 06/24/2021 00:51      PROCEDURES:  Critical Care performed: No  Procedures    IMPRESSION / MDM / ASSESSMENT AND PLAN / ED COURSE  I reviewed the triage vital signs and the nursing notes.   41 y.o. female with a history of ischemic colitis and idiopathic pancreatitis who presents for evaluation of epigastric abdominal pain.  Patient looks slightly uncomfortable due to pain but in no significant distress.  Vitals are within normal limits.  Abdomen is soft and nondistended with epigastric tenderness, no rebound or guarding.  Ddx: Pancreatitis versus gallbladder pathology versus peptic ulcer disease versus gastritis versus diverticulitis versus ischemic colitis versus GERD/indigestion   Plan: CT scan, CBC, CMP, lipase, urinalysis.  Will give IV fentanyl and Zofran for pain and nausea.  Will place patient on telemetry for close monitoring of hemodynamics   MEDICATIONS GIVEN IN ED: Medications  ondansetron (ZOFRAN-ODT) disintegrating tablet 4 mg (4 mg Oral Given 06/23/21 2103)  fentaNYL (SUBLIMAZE) injection 50 mcg (50 mcg Intravenous Given 06/24/21 0016)  ondansetron (ZOFRAN) injection 4 mg (4 mg Intravenous Given 06/24/21 0017)  iohexol (OMNIPAQUE) 350 MG/ML injection 100 mL (100 mLs Intravenous Contrast Given 06/24/21 0018)  acetaminophen (TYLENOL) tablet 1,000 mg (1,000 mg Oral Given 06/24/21 0112)  oxyCODONE (Oxy IR/ROXICODONE) immediate release tablet 5 mg (5 mg Oral Given 06/24/21 0112)     ED COURSE: CT  consistent with acute pancreatitis with no signs of ischemia or any other complication such as abscess or necrosis.  Radiologist is also concerned the patient may have some changes related to autoimmune pancreatitis, sarcoidosis, or amyloidosis.  I discussed these findings with patient and I recommended close follow-up with primary care doctor for a referral for further evaluation of these concerns since patient does have several episodes of idiopathic pancreatitis in the past.  I have put a urgent referral to [facility] clinic since she currently does not have a PCP.  Her labs did not show any signs of sepsis, normal white count and normal hemoglobin.  Normal kidney function, no significant electrolyte derangements, mildly elevated lipase at 65 with normal LFTs and alk phos and T bili.  UA with no evidence of UTI or blood.  Pregnancy test was negative.  Patient was switched from IV pain medication to p.o. with Tylenol and oxycodone.  Her pain was extremely well controlled and she was able to tolerate p.o. without any recurrence of the pain or nausea.  Admission was considered but since patient is doing well from a pain perspective and tolerating p.o. I feel that is safe for her to be discharged home especially since this is her  wish.  She will follow-up with PCP.  Discussed my standard return precautions and bland diet.   Consults: None   EMR reviewed including last visit with surgery for her ischemic colitis from 2020 and most recent visit with PCP from October 2022 for groin pain       FINAL CLINICAL IMPRESSION(S) / ED DIAGNOSES   Final diagnoses:  Idiopathic acute pancreatitis without infection or necrosis     Rx / DC Orders   ED Discharge Orders          Ordered    Ambulatory referral to Internal Medicine       Comments: Abnormal CT needs further evaluation for possible sarcoidosis   06/24/21 0225    ondansetron (ZOFRAN) 4 MG tablet  Daily PRN        06/24/21 0225     oxyCODONE-acetaminophen (PERCOCET) 5-325 MG tablet  Every 4 hours PRN        06/24/21 0225             Note:  This document was prepared using Dragon voice recognition software and may include unintentional dictation errors.   Alfred Levins, [place], MD 06/24/21 [id number]

## 2021-06-26 ENCOUNTER — Inpatient Hospital Stay
Admission: EM | Admit: 2021-06-26 | Discharge: 2021-06-30 | DRG: 438 | Disposition: A | Discharge status: Home / Self Care | Payer: Managed Care, Other (non HMO) | Attending: Internal Medicine | Admitting: Internal Medicine

## 2021-06-26 ENCOUNTER — Encounter: Payer: Self-pay | Admitting: Emergency Medicine

## 2021-06-26 ENCOUNTER — Other Ambulatory Visit: Payer: Self-pay

## 2021-06-26 DIAGNOSIS — E876 Hypokalemia: Secondary | ICD-10-CM | POA: Diagnosis present

## 2021-06-26 DIAGNOSIS — Z8719 Personal history of other diseases of the digestive system: Secondary | ICD-10-CM | POA: Diagnosis not present

## 2021-06-26 DIAGNOSIS — K859 Acute pancreatitis without necrosis or infection, unspecified: Secondary | ICD-10-CM

## 2021-06-26 DIAGNOSIS — R109 Unspecified abdominal pain: Secondary | ICD-10-CM | POA: Diagnosis not present

## 2021-06-26 DIAGNOSIS — Z800 Family history of malignant neoplasm of digestive organs: Secondary | ICD-10-CM | POA: Diagnosis not present

## 2021-06-26 DIAGNOSIS — U071 COVID-19: Secondary | ICD-10-CM | POA: Diagnosis present

## 2021-06-26 DIAGNOSIS — F419 Anxiety disorder, unspecified: Secondary | ICD-10-CM | POA: Diagnosis present

## 2021-06-26 DIAGNOSIS — G43909 Migraine, unspecified, not intractable, without status migrainosus: Secondary | ICD-10-CM | POA: Diagnosis present

## 2021-06-26 DIAGNOSIS — Z882 Allergy status to sulfonamides status: Secondary | ICD-10-CM | POA: Diagnosis not present

## 2021-06-26 DIAGNOSIS — Z888 Allergy status to other drugs, medicaments and biological substances status: Secondary | ICD-10-CM

## 2021-06-26 DIAGNOSIS — Z8249 Family history of ischemic heart disease and other diseases of the circulatory system: Secondary | ICD-10-CM | POA: Diagnosis not present

## 2021-06-26 DIAGNOSIS — K8500 Idiopathic acute pancreatitis without necrosis or infection: Principal | ICD-10-CM | POA: Diagnosis present

## 2021-06-26 LAB — URINALYSIS, ROUTINE W REFLEX MICROSCOPIC
Bilirubin Urine: NEGATIVE
Glucose, UA: NEGATIVE mg/dL
Hgb urine dipstick: NEGATIVE
Leukocytes,Ua: NEGATIVE
Nitrite: NEGATIVE
Protein, ur: NEGATIVE mg/dL
Specific Gravity, Urine: 1.015 (ref 1.005–1.030)
pH: 7.5 (ref 5.0–8.0)

## 2021-06-26 LAB — LIPID PANEL
Cholesterol: 191 mg/dL (ref 0–200)
HDL: 44 mg/dL (ref >40)
LDL Cholesterol: 140 mg/dL — ABNORMAL HIGH (ref 0–99)
Total CHOL/HDL Ratio: 4.3 RATIO
Triglycerides: 35 mg/dL (ref <150)
VLDL: 7 mg/dL (ref 0–40)

## 2021-06-26 LAB — CBC
HCT: 43.2 % (ref 36.0–46.0)
Hemoglobin: 15.2 g/dL — ABNORMAL HIGH (ref 12.0–15.0)
MCH: 31.5 pg (ref 26.0–34.0)
MCHC: 35.2 g/dL (ref 30.0–36.0)
MCV: 89.6 fL (ref 80.0–100.0)
Platelets: 335 10*3/uL (ref 150–400)
RBC: 4.82 MIL/uL (ref 3.87–5.11)
RDW: 11.7 % (ref 11.5–15.5)
WBC: 8.1 10*3/uL (ref 4.0–10.5)
nRBC: 0 % (ref 0.0–0.2)

## 2021-06-26 LAB — BASIC METABOLIC PANEL
Anion gap: 6 (ref 5–15)
BUN: 20 mg/dL (ref 6–20)
CO2: 24 mmol/L (ref 22–32)
Calcium: 8.3 mg/dL — ABNORMAL LOW (ref 8.9–10.3)
Chloride: 107 mmol/L (ref 98–111)
Creatinine, Ser: 0.82 mg/dL (ref 0.44–1.00)
GFR, Estimated: >60 mL/min (ref >60)
Glucose, Bld: 102 mg/dL — ABNORMAL HIGH (ref 70–99)
Potassium: 3.7 mmol/L (ref 3.5–5.1)
Sodium: 137 mmol/L (ref 135–145)

## 2021-06-26 LAB — COMPREHENSIVE METABOLIC PANEL
ALT: 8 U/L (ref 0–44)
AST: 19 U/L (ref 15–41)
Albumin: 4.9 g/dL (ref 3.5–5.0)
Alkaline Phosphatase: 45 U/L (ref 38–126)
Anion gap: 11 (ref 5–15)
BUN: 24 mg/dL — ABNORMAL HIGH (ref 6–20)
CO2: 24 mmol/L (ref 22–32)
Calcium: 9.3 mg/dL (ref 8.9–10.3)
Chloride: 100 mmol/L (ref 98–111)
Creatinine, Ser: 0.92 mg/dL (ref 0.44–1.00)
GFR, Estimated: >60 mL/min (ref >60)
Glucose, Bld: 109 mg/dL — ABNORMAL HIGH (ref 70–99)
Potassium: 3.3 mmol/L — ABNORMAL LOW (ref 3.5–5.1)
Sodium: 135 mmol/L (ref 135–145)
Total Bilirubin: 0.6 mg/dL (ref 0.3–1.2)
Total Protein: 8.5 g/dL — ABNORMAL HIGH (ref 6.5–8.1)

## 2021-06-26 LAB — LIPASE, BLOOD: Lipase: 47 U/L (ref 11–51)

## 2021-06-26 LAB — MAGNESIUM: Magnesium: 2.1 mg/dL (ref 1.7–2.4)

## 2021-06-26 LAB — HIV ANTIBODY (ROUTINE TESTING W REFLEX): HIV Screen 4th Generation wRfx: NONREACTIVE

## 2021-06-26 LAB — RESP PANEL BY RT-PCR (FLU A&B, COVID) ARPGX2
Influenza A by PCR: NEGATIVE
Influenza B by PCR: NEGATIVE
SARS Coronavirus 2 by RT PCR: POSITIVE — AB

## 2021-06-26 LAB — POC URINE PREG, ED: Preg Test, Ur: NEGATIVE

## 2021-06-26 MED ORDER — ONDANSETRON HCL 4 MG/2ML IJ SOLN
4.0000 mg | Freq: Once | INTRAMUSCULAR | Status: AC
  Administered 2021-06-26: 03:00:00 4 mg via INTRAVENOUS
  Filled 2021-06-26: qty 2

## 2021-06-26 MED ORDER — HYDROMORPHONE HCL 1 MG/ML IJ SOLN
0.5000 mg | INTRAMUSCULAR | Status: DC | PRN
  Administered 2021-06-26 – 2021-06-29 (×20): 0.5 mg via INTRAVENOUS
  Filled 2021-06-26 (×20): qty 1

## 2021-06-26 MED ORDER — ONDANSETRON HCL 4 MG/2ML IJ SOLN
4.0000 mg | Freq: Four times a day (QID) | INTRAMUSCULAR | Status: DC | PRN
  Administered 2021-06-26 – 2021-06-27 (×2): 4 mg via INTRAVENOUS
  Filled 2021-06-26 (×2): qty 2

## 2021-06-26 MED ORDER — ONDANSETRON HCL 4 MG PO TABS: 4.0000 mg | ORAL_TABLET | Freq: Four times a day (QID) | ORAL | Status: DC | PRN

## 2021-06-26 MED ORDER — SODIUM CHLORIDE 0.9 % IV SOLN: INTRAVENOUS | Status: DC

## 2021-06-26 MED ORDER — HYDROMORPHONE HCL 1 MG/ML IJ SOLN
0.5000 mg | Freq: Once | INTRAMUSCULAR | Status: AC
  Administered 2021-06-26: 04:00:00 0.5 mg via INTRAVENOUS
  Filled 2021-06-26: qty 1

## 2021-06-26 MED ORDER — ACETAMINOPHEN 325 MG PO TABS: 650.0000 mg | ORAL_TABLET | Freq: Four times a day (QID) | ORAL | Status: DC | PRN

## 2021-06-26 MED ORDER — ENOXAPARIN SODIUM 40 MG/0.4ML IJ SOSY: 40.0000 mg | PREFILLED_SYRINGE | INTRAMUSCULAR | Status: DC

## 2021-06-26 MED ORDER — POTASSIUM CHLORIDE CRYS ER 20 MEQ PO TBCR
40.0000 meq | EXTENDED_RELEASE_TABLET | Freq: Once | ORAL | Status: AC
  Administered 2021-06-26: 08:00:00 40 meq via ORAL
  Filled 2021-06-26: qty 2

## 2021-06-26 MED ORDER — PANTOPRAZOLE SODIUM 40 MG PO TBEC
40.0000 mg | DELAYED_RELEASE_TABLET | Freq: Two times a day (BID) | ORAL | Status: DC
  Administered 2021-06-26 – 2021-06-30 (×9): 40 mg via ORAL
  Filled 2021-06-26 (×9): qty 1

## 2021-06-26 MED ORDER — ACETAMINOPHEN 650 MG RE SUPP: 650.0000 mg | Freq: Four times a day (QID) | RECTAL | Status: DC | PRN

## 2021-06-26 MED ORDER — SODIUM CHLORIDE 0.9 % IV BOLUS
1000.0000 mL | Freq: Once | INTRAVENOUS | Status: AC
Start: 2021-06-26 — End: 2021-06-26
  Administered 2021-06-26: 03:00:00 1000 mL via INTRAVENOUS

## 2021-06-26 MED ORDER — OXYCODONE-ACETAMINOPHEN 5-325 MG PO TABS
1.0000 | ORAL_TABLET | ORAL | Status: DC | PRN
  Administered 2021-06-26 – 2021-06-27 (×4): 1 via ORAL
  Filled 2021-06-26 (×4): qty 1

## 2021-06-26 MED ORDER — SENNOSIDES-DOCUSATE SODIUM 8.6-50 MG PO TABS
2.0000 | ORAL_TABLET | Freq: Two times a day (BID) | ORAL | Status: DC
  Administered 2021-06-26 – 2021-06-29 (×5): 2 via ORAL
  Filled 2021-06-26 (×6): qty 2

## 2021-06-26 MED ORDER — MORPHINE SULFATE (PF) 4 MG/ML IV SOLN
4.0000 mg | Freq: Once | INTRAVENOUS | Status: AC
  Administered 2021-06-26: 03:00:00 4 mg via INTRAVENOUS
  Filled 2021-06-26: qty 1

## 2021-06-26 NOTE — Consult Note (Signed)
Jonathon Bellows , MD [address] [place], [place], [place], [address] [id number], [place], [place], [place], [address] Phone: [phone]  Fax: [phone]  Consultation  Referring Provider:   Er  Primary Care Physician:  [facility] [place] [facility] Primary Gastroenterologist:  [facility]     Reason for Consultation:     Pancreatitis  Date of Admission:  06/26/2021 Date of Consultation:  06/26/2021         HPI:   Ellen Blankenship is a 41 y.o. female Previously been seen and evaluated at [facility] clinic gastroenterology back in 2020.  Back in 2016 had an episode of possible infectious colitis.  Cholecystectomy in 2016.  Some concerns for ischemic colitis at that point of time.  The point of time showed left upper quadrant pain and she was evaluated with imaging.  Her constipation was referred to [facility] colorectal surgery.  I will suggest to have a repeat colonoscopy at that point of time.  I do not see a repeat colonoscopy at the point of time.  Appears she was subsequently lost to follow-up.  Presented to the emergency room in November 2021 with left upper quadrant pain underwent a CT scan of the abdomen which showed no abdominal pathology and no evidence of mesenteric ischemia.  Hypodense tissue surrounding the celiac axis similar or minimally increased compared to prior CT was noted MRI suggested.  Patient was discharged home.  She subsequently presented to the ER on 06/23/2021 with epigastric pain sudden in onset similar episode to prior history of pancreatitis she had a CT angiogram that showed soft tissue infiltration within the peritoneum within the peripancreatic soft tissues similar to prior examination may represent acute interstitial pancreatitis or autoimmune pancreatitis or retroperitoneal fibrosis.  Plan was to follow-up with her PCP.  She subsequently presented to the emergency room on 06/26/2021.    Her lipase 1 year back presenting with abdominal pain is 55 when she presented 3  days back was 65 and on this admission is 47.  On admission hemoglobin 15.2 g urinalysis was negative.  She is positive for COVID-19.  Calcium is 8.3.  Magnesium 2.1.  She states that her first episode of pancreatitis was about 15 years back.  Since then has had alcohol now.  On this occasion she states that she was doing well and all of a sudden after dinner having some pizza she developed epigastric pain radiating to the left upper quadrant and right upper quadrant.  The hospital after having the hospital the pain is better after taking pain medications.  She states that after every episode she has not had any determination of the cause of her pancreatitis or follow-up for the same.  Unclear so far about the etiology and precipitating factors.  No family history of pancreatitis no family history of pancreatic cancer no melena new medication started except Lexapro in the last few months.  She was referred and protein supplements not clear which ones but I suggested her to bring it to her physician's appointment at her follow-up visit to discuss and review the contents. She states that herself and her husband had COVID-19 about a week back and they are recovering from it. Past Medical History:  Diagnosis Date   Anxiety    History of Papanicolaou smear of cervix 06/16/2011   -/- NEG   Ischemic colitis ([place])    HOSPITALIZED 06/2014   Migraine with aura    Pancreatitis    POSS D/T WATER CONTAMINATION. PT  HAS NOT HAD SXS SINCE CHANGING TO FILTERED/BOTTLED WATER   Pars defect of lumbar spine     Past Surgical History:  Procedure Laterality Date   ARTHROSCOPIC REPAIR ACL Left    ELBOW SURGERY     LEFT   WISDOM TOOTH EXTRACTION      Prior to Admission medications   Medication Sig Start Date End Date Taking? Authorizing Provider  butalbital-acetaminophen-caffeine (FIORICET) 50-325-40 MG tablet Take 1 tablet by mouth 2 (two) times daily as needed. 06/20/21   [provider]   cyclobenzaprine (FLEXERIL) 10 MG tablet Take 1 tablet (10 mg total) by mouth 3 (three) times daily as needed for muscle spasms. Patient not taking: Reported on 07/01/2019 05/09/16   Hagler, Jami L, PA-C  escitalopram (LEXAPRO) 10 MG tablet Take 10 mg by mouth daily. 05/01/21 06/30/21  [provider]  loratadine (CLARITIN) 10 MG tablet Take 10 mg by mouth daily.    [provider]  LORazepam (ATIVAN) 0.5 MG tablet Take 1 tablet (0.5 mg total) by mouth 2 (two) times daily. Prn sx 3/32/95   Copland, Elmo Putt B, PA-C  ondansetron (ZOFRAN) 4 MG tablet Take 1 tablet (4 mg total) by mouth daily as needed for nausea or vomiting. 06/24/21 06/24/22  Rudene Re, MD  oxyCODONE-acetaminophen (PERCOCET) 5-325 MG tablet Take 1 tablet by mouth every 4 (four) hours as needed. 06/24/21   Rudene Re, MD  propranolol (INDERAL) 10 MG tablet Take by mouth. 07/01/19 06/30/20  [provider]  zolpidem (AMBIEN) 5 MG tablet Take 1 tablet (5 mg total) by mouth at bedtime as needed for sleep. Patient not taking: Reported on 07/01/2019 1/88/41   Copland, [name], PA-C    Family History  Problem Relation Age of Onset   Pancreatic cancer Paternal Grandfather    Colon cancer Paternal Grandfather 20   Cancer Paternal Grandfather        KIDNEY - UNKNOWN TYPE   Hypertension Maternal Grandmother    Heart disease Maternal Grandfather    Hypertension Maternal Grandfather    Breast cancer Neg Hx      Social History   Tobacco Use   Smoking status: Never   Smokeless tobacco: Never  Vaping Use   Vaping Use: Never used  Substance Use Topics   Alcohol use: No    Alcohol/week: 0.0 standard drinks   Drug use: No    Allergies as of 06/26/2021 - Review Complete 06/26/2021  Allergen Reaction Noted   Venlafaxine Swelling 04/26/2019   Paroxetine Other (See Comments) 01/28/2018   Sulfa antibiotics  12/25/2014    Review of Systems:    All systems reviewed and negative  except where noted in HPI.   Physical Exam:  Vital signs in last 24 hours: Temp:  [98 F (36.7 C)-98.5 F (36.9 C)] 98 F (36.7 C) (01/25 1011) Pulse Rate:  [48-110] 48 (01/25 1011) Resp:  [16-22] 16 (01/25 1011) BP: (110-121)/(59-108) 112/59 (01/25 1011) SpO2:  [97 %-100 %] 99 % (01/25 1011) Weight:  [61.2 kg] 61.2 kg (01/25 0151)   General:   Pleasant, cooperative in NAD Head:  Normocephalic and atraumatic. Eyes:   No icterus.   Conjunctiva pink. PERRLA. Ears:  Normal auditory acuity. Neck:  Supple; no masses or thyroidomegaly Lungs: Respirations even and unlabored. Lungs clear to auscultation bilaterally.   No wheezes, crackles, or rhonchi.  Heart:  Regular rate and rhythm;  Without murmur, clicks, rubs or gallops Abdomen:  Soft, nondistended, nontender. Normal bowel sounds. No appreciable masses or hepatomegaly.  No rebound or guarding.  Neurologic:  Alert and oriented x3;  grossly normal neurologically. Skin:  Intact without significant lesions or rashes. Cervical Nodes:  No significant cervical adenopathy. Psych:  Alert and cooperative. Normal affect.  LAB RESULTS: Recent Labs    06/23/21 2059 06/26/21 0200  WBC 7.7 8.1  HGB 14.4 15.2*  HCT 40.8 43.2  PLT 279 335   BMET Recent Labs    06/23/21 2059 06/26/21 0200 06/26/21 0845  NA 138 135 137  K 3.5 3.3* 3.7  CL 104 100 107  CO2 24 24 24   GLUCOSE 119* 109* 102*  BUN 22* 24* 20  CREATININE 0.78 0.92 0.82  CALCIUM 9.5 9.3 8.3*   LFT Recent Labs    06/26/21 0200  PROT 8.5*  ALBUMIN 4.9  AST 19  ALT 8  ALKPHOS 45  BILITOT 0.6   PT/INR No results for input(s): LABPROT, INR in the last 72 hours.  STUDIES: No results found.    Impression / Plan:   Ellen Blankenship is a 41 y.o. y/o female presented with abdominal pain similar to what she had in the past.  Normal lipase but had a history of pancreatitis on imaging from 2 days back shows features that could indicate pancreatitis versus retroperitoneal  fibrosis.  At this point of time she is positive for COVID-19.  She does give a history of pain right after eating which could indicate a biliary component.  No recent imaging in terms of ultrasound of the gallbladder to determine if there are gallstones or any sludge present.  No prior evaluation with IgG4 levels or lipids to determine underlying etiology.  Plan 1.  IV PPI and when taking orally can convert to oral PPI 2.  Check triglycerides, IgG4 levels and rehydrate with IV fluids and provide analgesia as needed. 3.  As an outpatient she will need to follow-up with her gastroenterologist and I suggested that she undergo an endoscopic ultrasound to evaluate the gallbladder to rule out sludge in the gallbladder as a precipitating factor for pancreatitis.  In addition it will help to evaluate the pancreatic duct and pancreas.  She also might need to be seen by a rheumatologist to remind any causes with retroperitoneal fibrosis and have appropriate follow-up for the same.  I also suggested her to take her health supplements and show it to her gastroenterologist to review the ingredients to see if any of those have been associated with pancreatitis. 4.  Check H. pylori stool antigen 5.  Once the phase of COVID has resolved in her could consider upper endoscopy as well but will not perform at this point of time.  .  Thank you for involving me in the care of this patient.      LOS: 0 days   Jonathon Bellows, MD  06/26/2021, 10:37 AM

## 2021-06-26 NOTE — ED Notes (Signed)
Jacqui RN aware of assigned bed

## 2021-06-26 NOTE — ED Triage Notes (Signed)
Pt to ED from home c/o epigastric pain radiating throughout abd.  States ate dinner yesterday and pain started but was able to go to sleep and then woke up with pain worse, tried taking pain meds at home but pain getting worse.  Nausea without vomiting.  Chart shows was here a couple days ago for same and sent home.  Pt appears miserable in triage, up and down from wheelchair, moaning, chest rise even and unlabored.

## 2021-06-26 NOTE — ED Notes (Signed)
Pt updated on new liquid diet order and given ginger ale

## 2021-06-26 NOTE — Progress Notes (Signed)
PROGRESS NOTE    Ellen Blankenship  PFX:[id number] DOB: Jul 25, 1980 DOA: 06/26/2021 PCP: [facility]   Chief complaint.  Abdominal pain. Brief Narrative:  Ellen Blankenship is a 41 y.o. female with medical history significant for Migraines, anxiety, acute pancreatitis, prolonged hospitalized in 2016 for acute LUQ pain that started during a run, with biopsy-proven ischemic colitis of the splenic flexure, however with patent aortic/mesenteric vasculature, and with recurrent intermittent LUQ pain since, who presents to the ED for the second time in a week with severe epigastric pain associated with nausea. Lipase was mildly elevated, but a CT angiogram showed changes associated with autoimmune pancreatitis.  Patient was placed on fluids and pain medicine.   Assessment & Plan:   Principal Problem:   Intractable abdominal pain Active Problems:   History of ischemic bowel disease   Anxiety   Acute pancreatitis, idiopathic, recurrent   Hypokalemia   Migraines  Intractable abdominal pain. Acute and recurrent pancreatitis. Patient still requiring significant amount of pain medicine, but her nausea vomiting has resolved.  I will start a clear liquid diet today, advance to soft at the evening time if tolerated.  Lipase has dropped down to normal. Patient has mild upper stomach tenderness, will start Protonix.  Hypokalemia. Recheck BMP and magnesium tomorrow.  History of ischemic colitis. No evidence of recurrence at this time.    DVT prophylaxis: SCDs Code Status: full Family Communication:  Disposition Plan:    Status is: Inpatient  Remains inpatient appropriate because: Severity of disease and IV fluids.        I/O last 3 completed shifts: In: 1000 [IV Piggyback:1000] Out: -  No intake/output data recorded.     Consultants:    Procedures: None  Antimicrobials: None  Subjective: Patient still has significant abdominal pain, requiring as needed pain medicine.   But nausea vomiting has resolved.  Patient did not have any diarrhea or constipation. Denies any short of breath or cough.   Objective: Vitals:   06/26/21 0159 06/26/21 0700 06/26/21 1011 06/26/21 1126  BP: (!) 121/108 110/71 (!) 112/59 113/67  Pulse: (!) 110 (!) 59 (!) 48 (!) 51  Resp: (!) 22 20 16 16   Temp: 98.5 F (36.9 C)  98 F (36.7 C) 98 F (36.7 C)  TempSrc: Oral  Oral Oral  SpO2: 100% 97% 99% 93%  Weight:      Height:        Intake/Output Summary (Last 24 hours) at 06/26/2021 1331 Last data filed at 06/26/2021 0400 Gross per 24 hour  Intake 1000 ml  Output --  Net 1000 ml   Filed Weights   06/26/21 0151  Weight: 61.2 kg    Examination:  General exam: Appears calm and comfortable  Respiratory system: Clear to auscultation. Respiratory effort normal. Cardiovascular system: S1 & S2 heard, RRR. No JVD, murmurs, rubs, gallops or clicks. No pedal edema. Gastrointestinal system: Abdomen is nondistended, soft and up gastric tender. No organomegaly or masses felt. Normal bowel sounds heard. Central nervous system: Alert and oriented. No focal neurological deficits. Extremities: Symmetric 5 x 5 power. Skin: No rashes, lesions or ulcers Psychiatry: Judgement and insight appear normal. Mood & affect appropriate.     Data Reviewed: I have personally reviewed following labs and imaging studies  CBC: Recent Labs  Lab 06/23/21 2059 06/26/21 0200  WBC 7.7 8.1  HGB 14.4 15.2*  HCT 40.8 43.2  MCV 90.3 89.6  PLT 279 329   Basic Metabolic Panel: Recent Labs  Lab  06/23/21 2059 06/26/21 0200 06/26/21 0845  NA 138 135 137  K 3.5 3.3* 3.7  CL 104 100 107  CO2 24 24 24   GLUCOSE 119* 109* 102*  BUN 22* 24* 20  CREATININE 0.78 0.92 0.82  CALCIUM 9.5 9.3 8.3*  MG  --   --  2.1   GFR: Estimated Creatinine Clearance: 80 mL/min (by C-G formula based on SCr of 0.82 mg/dL). Liver Function Tests: Recent Labs  Lab 06/23/21 2059 06/26/21 0200  AST 19 19  ALT 9 8   ALKPHOS 47 45  BILITOT 0.5 0.6  PROT 7.7 8.5*  ALBUMIN 4.4 4.9   Recent Labs  Lab 06/23/21 2059 06/26/21 0200  LIPASE 65* 47   No results for input(s): AMMONIA in the last 168 hours. Coagulation Profile: No results for input(s): INR, PROTIME in the last 168 hours. Cardiac Enzymes: No results for input(s): CKTOTAL, CKMB, CKMBINDEX, TROPONINI in the last 168 hours. BNP (last 3 results) No results for input(s): PROBNP in the last 8760 hours. HbA1C: No results for input(s): HGBA1C in the last 72 hours. CBG: No results for input(s): GLUCAP in the last 168 hours. Lipid Profile: No results for input(s): CHOL, HDL, LDLCALC, TRIG, CHOLHDL, LDLDIRECT in the last 72 hours. Thyroid Function Tests: No results for input(s): TSH, T4TOTAL, FREET4, T3FREE, THYROIDAB in the last 72 hours. Anemia Panel: No results for input(s): VITAMINB12, FOLATE, FERRITIN, TIBC, IRON, RETICCTPCT in the last 72 hours. Sepsis Labs: No results for input(s): PROCALCITON, LATICACIDVEN in the last 168 hours.  Recent Results (from the past 240 hour(s))  Resp Panel by RT-PCR (Flu A&B, Covid) Nasopharyngeal Swab     Status: Abnormal   Collection Time: 06/26/21  4:44 AM   Specimen: Nasopharyngeal Swab; Nasopharyngeal(NP) swabs in vial transport medium  Result Value Ref Range Status   SARS Coronavirus 2 by RT PCR POSITIVE (A) NEGATIVE Final    Comment: (NOTE) SARS-CoV-2 target nucleic acids are DETECTED.  The SARS-CoV-2 RNA is generally detectable in upper respiratory specimens during the acute phase of infection. Positive results are indicative of the presence of the identified virus, but do not rule out bacterial infection or co-infection with other pathogens not detected by the test. Clinical correlation with patient history and other diagnostic information is necessary to determine patient infection status. The expected result is Negative.  Fact Sheet for  Patients: EntrepreneurPulse.com.au  Fact Sheet for Healthcare Providers: IncredibleEmployment.be  This test is not yet approved or cleared by the [place] FDA and  has been authorized for detection and/or diagnosis of SARS-CoV-2 by FDA under an Emergency Use Authorization (EUA).  This EUA will remain in effect (meaning this test can be used) for the duration of  the COVID-19 declaration under Section 564(b)(1) of the A ct, 21 U.S.C. section 360bbb-3(b)(1), unless the authorization is terminated or revoked sooner.     Influenza A by PCR NEGATIVE NEGATIVE Final   Influenza B by PCR NEGATIVE NEGATIVE Final    Comment: (NOTE) The Xpert Xpress SARS-CoV-2/FLU/RSV plus assay is intended as an aid in the diagnosis of influenza from Nasopharyngeal swab specimens and should not be used as a sole basis for treatment. Nasal washings and aspirates are unacceptable for Xpert Xpress SARS-CoV-2/FLU/RSV testing.  Fact Sheet for Patients: EntrepreneurPulse.com.au  Fact Sheet for Healthcare Providers: IncredibleEmployment.be  This test is not yet approved or cleared by the [place] FDA and has been authorized for detection and/or diagnosis of SARS-CoV-2 by FDA under an Emergency Use Authorization (EUA). This  EUA will remain in effect (meaning this test can be used) for the duration of the COVID-19 declaration under Section 564(b)(1) of the Act, 21 U.S.C. section 360bbb-3(b)(1), unless the authorization is terminated or revoked.  Performed at [facility], [address], [place], [place] [address]          Radiology Studies: No results found.      Scheduled Meds:  pantoprazole  40 mg Oral BID   Continuous Infusions:  sodium chloride 100 mL/hr at 06/26/21 0508     LOS: 0 days    Time spent: no charge    Sharen Hones, MD [facility]   To contact the attending provider  between 7A-7P or the covering provider during after hours 7P-7A, please log into the web site [email or web address] and access using universal [place] password for that web site. If you do not have the password, please call the hospital operator.  06/26/2021, 1:31 PM

## 2021-06-26 NOTE — ED Provider Notes (Signed)
[facility] Provider Note    Event Date/Time   First MD Initiated Contact with Patient 06/26/21 0159     (approximate)   History   Pancreatitis   HPI  Ellen Blankenship is a 41 y.o. 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  Patient was seen on 1/22 for similar when her pain for started.  Pain is located in the left upper quadrant and feels like a stabbing sensation.  She had a CT angio done which showed sequela of either chronic retroperitoneal fibrosis versus acute interstitial pancreatitis.  She was discharged with oral opioids.  The day following patient did well at home but did not eat anything.  Tonight she had a taco and had acute onset of pain afterward.  Pain is very severe 10 out of 10 in severity.  No associated nausea vomiting diarrhea or blood in her stool.  No fevers.    Past Medical History:  Diagnosis Date   Anxiety    History of Papanicolaou smear of cervix 06/16/2011   -/- NEG   Ischemic colitis ([place])    HOSPITALIZED 06/2014   Migraine with aura    Pancreatitis    POSS D/T WATER CONTAMINATION. PT HAS NOT HAD SXS SINCE CHANGING TO FILTERED/BOTTLED WATER   Pars defect of lumbar spine     Patient Active Problem List   Diagnosis Date Noted   Acute pancreatitis, idiopathic, recurrent 06/26/2021   Intractable abdominal pain 06/26/2021   Abdominal pain, epigastric 11/21/2018   Ischemic colitis ([place]) 11/21/2018   Right ovarian cyst 11/10/2018   Anxiety and depression 12/16/2016   Anxiety 11/14/2016   Visual field defect 08/03/2015   History of ischemic bowel disease 08/03/2015   Pars defect of lumbar spine 08/03/2015     Physical Exam  Triage Vital Signs: ED Triage Vitals  Enc Vitals Group     BP 06/26/21 0159 (!) 121/108     Pulse Rate 06/26/21 0159 (!) 110     Resp 06/26/21  0159 (!) 22     Temp 06/26/21 0159 98.5 F (36.9 C)     Temp Source 06/26/21 0159 Oral     SpO2 06/26/21 0159 100 %     Weight 06/26/21 0151 135 lb (61.2 kg)     Height 06/26/21 0151 5\' 2"  (1.575 m)     Head Circumference --      Peak Flow --      Pain Score 06/26/21 0151 10     Pain Loc --      Pain Edu? --      Excl. in [place]? --     Most recent vital signs: Vitals:   06/26/21 0159  BP: (!) 121/108  Pulse: (!) 110  Resp: (!) 22  Temp: 98.5 F (36.9 C)  SpO2: 100%     General: Awake, patient appears uncomfortable, writhing around in the stretcher CV:  Good peripheral perfusion.  Resp:  Normal effort.  Abd:  No distention.  Tender to palpation primarily in the epigastric region and left upper quadrant, voluntary guarding Neuro:             Awake, Alert, Oriented x 3     ED Results / Procedures / Treatments  Labs (all labs ordered are listed, but only abnormal results are displayed) Labs Reviewed  COMPREHENSIVE METABOLIC PANEL - Abnormal; Notable for the  following components:      Result Value   Potassium 3.3 (*)    Glucose, Bld 109 (*)    BUN 24 (*)    Total Protein 8.5 (*)    All other components within normal limits  CBC - Abnormal; Notable for the following components:   Hemoglobin 15.2 (*)    All other components within normal limits  URINALYSIS, ROUTINE W REFLEX MICROSCOPIC - Abnormal; Notable for the following components:   Ketones, ur TRACE (*)    All other components within normal limits  RESP PANEL BY RT-PCR (FLU A&B, COVID) ARPGX2  LIPASE, BLOOD  POC URINE PREG, ED     EKG     RADIOLOGY    PROCEDURES:  Critical Care performed: No  Procedures   MEDICATIONS ORDERED IN ED: Medications  morphine 4 MG/ML injection 4 mg (4 mg Intravenous Given 06/26/21 0234)  ondansetron (ZOFRAN) injection 4 mg (4 mg Intravenous Given 06/26/21 0234)  sodium chloride 0.9 % bolus 1,000 mL (1,000 mLs Intravenous New Bag/Given 06/26/21 0236)  HYDROmorphone  (DILAUDID) injection 0.5 mg (0.5 mg Intravenous Given 06/26/21 0350)     IMPRESSION / MDM / ASSESSMENT AND PLAN / ED COURSE  I reviewed the triage vital signs and the nursing notes.                              Differential diagnosis includes, but is not limited to, acute pancreatitis, chronic pancreatitis, gastritis  This patient is a 41 year old female with a history of both ischemic colitis and idiopathic pancreatitis who presents with left upper quadrant abdominal pain.  Symptoms started on 1/22 and she was evaluated the emergency department at that time had a CT angio given history of ischemic colitis which did show some peripancreatic infiltration which is read by radiology as either consistent with acute pancreatitis versus retroperitoneal fibrosis and appeared similar to her prior imaging.  Her lipase was negative.  Patient initially felt okay at home and was taking oral opioids but did not eat anything on the first day after discharge from the ED.  Tonight had a taco and had acute onset of worsening pain.  Eitel signs notable for hypertension tachycardia.  On initial evaluation patient appears very uncomfortable is writhing around in the bed holding her abdomen.  Abdomen is tender primarily in epigastric and left upper quadrants.  Reviewed her labs which are overall reassuring, hemoglobin is 15.2 indicating likely hemoconcentration she has ketones in her urine.  Mildly hypokalemic to 3.3 but lipase is normal.  LFTs also normal.  Somewhat unclear what is the underlying cause of her symptoms but with the CT finding of potential pancreatitis and this typical pain she does meet criteria for the diagnosis.  Patient was given morphine Zofran and fluids.  She required a second dose of IV Dilaudid due to ongoing pain.  On reassessment feels much improved but is concerned about going home and having recurrent pain and needed to come back.  Given this is her second visit I do feel it is reasonable to admit  her to the hospital for observation and bowel rest.  Also the hospitalist for admission. Clinical Course as of 06/26/21 0655  Wed Jun 26, 2021  0655 SARS Coronavirus 2 by RT PCR(!): POSITIVE [KM]    Clinical Course User Index [KM] Rada Hay, MD     FINAL CLINICAL IMPRESSION(S) / ED DIAGNOSES   Final diagnoses:  Acute pancreatitis, unspecified  complication status, unspecified pancreatitis type     Rx / DC Orders   ED Discharge Orders     None        Note:  This document was prepared using Dragon voice recognition software and may include unintentional dictation errors.   Rada Hay, MD 06/26/21 Zettie Cooley

## 2021-06-26 NOTE — ED Notes (Signed)
Pt resting in bed state pain has be gradually coming back for about 6min. Pt given Prn dose of pain medications.  Pt updated on plan of care   Call light is within reach and pt denies further needs at this time

## 2021-06-26 NOTE — H&P (Signed)
History and Physical    Ellen Blankenship DOB: [birth year] DOA: 06/26/2021  PCP: [facility]   Patient coming from: home  I have personally briefly reviewed patient's relevant medical records in [place]  Chief Complaint: abdominal pain  HPI: Ellen Blankenship is a 41 y.o. female with medical history significant for Migraines, anxiety, acute pancreatitis, prolonged hospitalized in 2016 for acute LUQ pain that started during a run, with biopsy-proven ischemic colitis of the splenic flexure, however with patent aortic/mesenteric vasculature, and with recurrent intermittent LUQ pain since, who presents to the ED for the second time in a week with severe epigastric pain associated with nausea.  On her first visit on 1/22 CTA abdomen and pelvis showed normal vasculature but did show peripancreatic stranding.  Lipase was elevated at 65.  Her pain improved with pain medication and she was discharged.  She returns tonight with recurrent symptoms that started after eating a taco.  The pain is stabbing and feels like someone ' carving out a piece of my stomach'.  She has nausea without vomiting.  She denies diarrhea or blood in her stool.  She denies chest pain or shortness of breath.  ED course: On arrival, tachycardic at 110, tachypneic at 22 with BP 121/108 and afebrile Blood work: Lipase and LFTs WNL Potassium 3.3 CBC unremarkable Urinalysis unremarkable  CTA 1/23: Changes related to autoimmune pancreatitis or retroperitoneal fibrosis Please see complete report  Patient treated with morphine, Dilaudid with some improvement but without complete resolution of pain and due to second visit to the ED in 2 days, decision made to admit.   Review of Systems: As per HPI otherwise all other systems on review of systems negative.   Assessment/Plan    Intractable abdominal pain, recurrent -Working diagnosis of acute pancreatitis in spite of normalization of lipase from 1/23 -  CTA abdomen and pelvis from 1/23 mentions " autoimmune pancreatitis or retroperitoneal fibrosis" - Patient has had extensive GI work-up in the past - GI consult requested    Acute pancreatitis, idiopathic, recurrent - N.p.o. in case of endoscopic procedure - IV pain meds, antiemetics - IV fluids    Hypokalemia - Oral potassium repletion    History of ischemic colitis 2016 - CTA on 123 with normal vasculature    Anxiety - Continue home meds pending med verification    Migraines - Followed regularly by neurology.  On Botox shots   DVT prophylaxis: SCDs in case of procedure Code Status: full code  Family Communication:  none  Disposition Plan: Back to previous home environment Consults called: GI Status: Observation    Physical Exam: Vitals:   06/26/21 0151 06/26/21 0159  BP:  (!) 121/108  Pulse:  (!) 110  Resp:  (!) 22  Temp:  98.5 F (36.9 C)  TempSrc:  Oral  SpO2:  100%  Weight: 61.2 kg   Height: 5\' 2"  (1.575 m)    Constitutional: Alert, oriented x 3 .  In pain discomfort HEENT:      Head: Normocephalic and atraumatic.         Eyes: PERLA, EOMI, Conjunctivae are normal. Sclera is non-icteric.       Mouth/Throat: Mucous membranes are moist.       Neck: Supple with no signs of meningismus. Cardiovascular: Regular rate and rhythm. No murmurs, gallops, or rubs. 2+ symmetrical distal pulses are present . No JVD. No  LE edema Respiratory: Respiratory effort normal .Lungs sounds clear bilaterally. No wheezes, crackles, or rhonchi.  Gastrointestinal: Soft, tender in epigastrium, non distended. Positive bowel sounds.  Genitourinary: No CVA tenderness. Musculoskeletal: Nontender with normal range of motion in all extremities. No cyanosis, or erythema of extremities. Neurologic:  Face is symmetric. Moving all extremities. No gross focal neurologic deficits . Skin: Skin is warm, dry.  No rash or ulcers Psychiatric: Mood and affect are appropriate     Past Medical  History:  Diagnosis Date   Anxiety    History of Papanicolaou smear of cervix 06/16/2011   -/- NEG   Ischemic colitis ([place])    HOSPITALIZED 06/2014   Migraine with aura    Pancreatitis    POSS D/T WATER CONTAMINATION. PT HAS NOT HAD SXS SINCE CHANGING TO FILTERED/BOTTLED WATER   Pars defect of lumbar spine     Past Surgical History:  Procedure Laterality Date   ARTHROSCOPIC REPAIR ACL Left    ELBOW SURGERY     LEFT   WISDOM TOOTH EXTRACTION       reports that she has never smoked. She has never used smokeless tobacco. She reports that she does not drink alcohol and does not use drugs.  Allergies  Allergen Reactions   Venlafaxine Swelling    She reports throat and tongue swelling   Paroxetine Other (See Comments)    Non-allergic side effects Non-allergic side effects    Sulfa Antibiotics     Other reaction(s): Other (See Comments) Blisters in eye with sulfa eye drops    Family History  Problem Relation Age of Onset   Pancreatic cancer Paternal Grandfather    Colon cancer Paternal Grandfather 51   Cancer Paternal Grandfather        KIDNEY - UNKNOWN TYPE   Hypertension Maternal Grandmother    Heart disease Maternal Grandfather    Hypertension Maternal Grandfather    Breast cancer Neg Hx       Prior to Admission medications   Medication Sig Start Date End Date Taking? Authorizing Provider  butalbital-acetaminophen-caffeine (FIORICET) 50-325-40 MG tablet Take 1 tablet by mouth 2 (two) times daily as needed. 06/20/21   [provider]  cyclobenzaprine (FLEXERIL) 10 MG tablet Take 1 tablet (10 mg total) by mouth 3 (three) times daily as needed for muscle spasms. Patient not taking: Reported on 07/01/2019 05/09/16   Hagler, Jami L, PA-C  escitalopram (LEXAPRO) 10 MG tablet Take 10 mg by mouth daily. 05/01/21 06/30/21  [provider]  loratadine (CLARITIN) 10 MG tablet Take 10 mg by mouth daily.    [provider]  LORazepam (ATIVAN) 0.5 MG tablet  Take 1 tablet (0.5 mg total) by mouth 2 (two) times daily. Prn sx 3/78/58   Copland, Elmo Putt B, PA-C  ondansetron (ZOFRAN) 4 MG tablet Take 1 tablet (4 mg total) by mouth daily as needed for nausea or vomiting. 06/24/21 06/24/22  Rudene Re, MD  oxyCODONE-acetaminophen (PERCOCET) 5-325 MG tablet Take 1 tablet by mouth every 4 (four) hours as needed. 06/24/21   Rudene Re, MD  propranolol (INDERAL) 10 MG tablet Take by mouth. 07/01/19 06/30/20  [provider]  zolpidem (AMBIEN) 5 MG tablet Take 1 tablet (5 mg total) by mouth at bedtime as needed for sleep. Patient not taking: Reported on 07/01/2019 8/50/27   Copland, [name], PA-C      Labs on Admission: I have personally reviewed following labs and imaging studies  CBC: Recent Labs  Lab 06/23/21 2059 06/26/21 0200  WBC 7.7 8.1  HGB 14.4 15.2*  HCT 40.8 43.2  MCV 90.3 89.6  PLT 279 151   Basic Metabolic Panel: Recent Labs  Lab 06/23/21 2059 06/26/21 0200  NA 138 135  K 3.5 3.3*  CL 104 100  CO2 24 24  GLUCOSE 119* 109*  BUN 22* 24*  CREATININE 0.78 0.92  CALCIUM 9.5 9.3   GFR: Estimated Creatinine Clearance: 71.3 mL/min (by C-G formula based on SCr of 0.92 mg/dL). Liver Function Tests: Recent Labs  Lab 06/23/21 2059 06/26/21 0200  AST 19 19  ALT 9 8  ALKPHOS 47 45  BILITOT 0.5 0.6  PROT 7.7 8.5*  ALBUMIN 4.4 4.9   Recent Labs  Lab 06/23/21 2059 06/26/21 0200  LIPASE 65* 47   No results for input(s): AMMONIA in the last 168 hours. Coagulation Profile: No results for input(s): INR, PROTIME in the last 168 hours. Cardiac Enzymes: No results for input(s): CKTOTAL, CKMB, CKMBINDEX, TROPONINI in the last 168 hours. BNP (last 3 results) No results for input(s): PROBNP in the last 8760 hours. HbA1C: No results for input(s): HGBA1C in the last 72 hours. CBG: No results for input(s): GLUCAP in the last 168 hours. Lipid Profile: No results for input(s): CHOL, HDL, LDLCALC, TRIG, CHOLHDL,  LDLDIRECT in the last 72 hours. Thyroid Function Tests: No results for input(s): TSH, T4TOTAL, FREET4, T3FREE, THYROIDAB in the last 72 hours. Anemia Panel: No results for input(s): VITAMINB12, FOLATE, FERRITIN, TIBC, IRON, RETICCTPCT in the last 72 hours. Urine analysis:    Component Value Date/Time   COLORURINE YELLOW 06/26/2021 0353   APPEARANCEUR CLEAR 06/26/2021 0353   APPEARANCEUR Clear 06/18/2014 1210   LABSPEC 1.015 06/26/2021 0353   LABSPEC 1.005 06/18/2014 1210   PHURINE 7.5 06/26/2021 0353   GLUCOSEU NEGATIVE 06/26/2021 0353   GLUCOSEU Negative 06/18/2014 1210   HGBUR NEGATIVE 06/26/2021 0353   BILIRUBINUR NEGATIVE 06/26/2021 0353   BILIRUBINUR Negative 06/18/2014 1210   KETONESUR TRACE (A) 06/26/2021 0353   PROTEINUR NEGATIVE 06/26/2021 0353   NITRITE NEGATIVE 06/26/2021 0353   LEUKOCYTESUR NEGATIVE 06/26/2021 0353   LEUKOCYTESUR Negative 06/18/2014 1210    Radiological Exams on Admission: No results found.     Athena Masse MD [facility]   06/26/2021, 4:47 AM

## 2021-06-27 ENCOUNTER — Inpatient Hospital Stay: Payer: Managed Care, Other (non HMO)

## 2021-06-27 DIAGNOSIS — K859 Acute pancreatitis without necrosis or infection, unspecified: Secondary | ICD-10-CM | POA: Diagnosis not present

## 2021-06-27 DIAGNOSIS — E876 Hypokalemia: Secondary | ICD-10-CM

## 2021-06-27 LAB — BASIC METABOLIC PANEL
Anion gap: 4 — ABNORMAL LOW (ref 5–15)
BUN: 13 mg/dL (ref 6–20)
CO2: 25 mmol/L (ref 22–32)
Calcium: 8.3 mg/dL — ABNORMAL LOW (ref 8.9–10.3)
Chloride: 108 mmol/L (ref 98–111)
Creatinine, Ser: 0.75 mg/dL (ref 0.44–1.00)
GFR, Estimated: >60 mL/min (ref >60)
Glucose, Bld: 86 mg/dL (ref 70–99)
Potassium: 4.1 mmol/L (ref 3.5–5.1)
Sodium: 137 mmol/L (ref 135–145)

## 2021-06-27 LAB — MAGNESIUM: Magnesium: 2.2 mg/dL (ref 1.7–2.4)

## 2021-06-27 LAB — PHOSPHORUS: Phosphorus: 3.8 mg/dL (ref 2.5–4.6)

## 2021-06-27 LAB — IGG 4: IgG, Subclass 4: 36 mg/dL (ref 2–96)

## 2021-06-27 MED ORDER — ONDANSETRON HCL 4 MG/2ML IJ SOLN: 4.0000 mg | INTRAMUSCULAR | Status: DC | PRN

## 2021-06-27 MED ORDER — ONDANSETRON HCL 4 MG PO TABS: 4.0000 mg | ORAL_TABLET | Freq: Four times a day (QID) | ORAL | Status: DC | PRN

## 2021-06-27 MED ORDER — ONDANSETRON HCL 4 MG PO TABS: 4.0000 mg | ORAL_TABLET | ORAL | Status: DC | PRN

## 2021-06-27 MED ORDER — SODIUM CHLORIDE 0.9 % IV SOLN
12.5000 mg | Freq: Four times a day (QID) | INTRAVENOUS | Status: DC | PRN
  Administered 2021-06-27: 12.5 mg via INTRAVENOUS
  Filled 2021-06-27 (×3): qty 0.5

## 2021-06-27 MED ORDER — OXYCODONE-ACETAMINOPHEN 5-325 MG PO TABS
1.0000 | ORAL_TABLET | ORAL | Status: DC | PRN
  Administered 2021-06-27 – 2021-06-30 (×8): 2 via ORAL
  Filled 2021-06-27: qty 2
  Filled 2021-06-27: qty 1
  Filled 2021-06-27 (×8): qty 2

## 2021-06-27 NOTE — Progress Notes (Signed)
Jonathon Bellows , MD [address] [place], [place], [place], [address] [address], [place], [place], [place], [address] Phone: [phone]  Fax: [place] is being followed for pancreatitis  Day 1 of follow up   Subjective: Still has epigastric pain when she eats - a bit better she thinks she feels    Objective: Vital signs in last 24 hours: Vitals:   06/26/21 1743 06/26/21 2040 06/27/21 0518 06/27/21 0805  BP: 106/65 106/64 (!) 94/58 (!) 101/57  Pulse: (!) 52 (!) 54 (!) 56 (!) 53  Resp: 16 18 18 16   Temp: 97.9 F (36.6 C) (!) 97.5 F (36.4 C) 97.7 F (36.5 C) 97.8 F (36.6 C)  TempSrc: Oral Oral Oral Oral  SpO2: 95% 99% 99% 98%  Weight:      Height:       Weight change:   Intake/Output Summary (Last 24 hours) at 06/27/2021 1016 Last data filed at 06/26/2021 1500 Gross per 24 hour  Intake 940.1 ml  Output --  Net 940.1 ml     Exam:  Abdomen: soft, mild epigastric tenderness, normal bowel sounds   Lab Results: @LABTEST2 @ Micro Results: Recent Results (from the past 240 hour(s))  Resp Panel by RT-PCR (Flu A&B, Covid) Nasopharyngeal Swab     Status: Abnormal   Collection Time: 06/26/21  4:44 AM   Specimen: Nasopharyngeal Swab; Nasopharyngeal(NP) swabs in vial transport medium  Result Value Ref Range Status   SARS Coronavirus 2 by RT PCR POSITIVE (A) NEGATIVE Final    Comment: (NOTE) SARS-CoV-2 target nucleic acids are DETECTED.  The SARS-CoV-2 RNA is generally detectable in upper respiratory specimens during the acute phase of infection. Positive results are indicative of the presence of the identified virus, but do not rule out bacterial infection or co-infection with other pathogens not detected by the test. Clinical correlation with patient history and other diagnostic information is necessary to determine patient infection status. The expected result is Negative.  Fact Sheet for  Patients: EntrepreneurPulse.com.au  Fact Sheet for Healthcare Providers: IncredibleEmployment.be  This test is not yet approved or cleared by the [place] FDA and  has been authorized for detection and/or diagnosis of SARS-CoV-2 by FDA under an Emergency Use Authorization (EUA).  This EUA will remain in effect (meaning this test can be used) for the duration of  the COVID-19 declaration under Section 564(b)(1) of the A ct, 21 U.S.C. section 360bbb-3(b)(1), unless the authorization is terminated or revoked sooner.     Influenza A by PCR NEGATIVE NEGATIVE Final   Influenza B by PCR NEGATIVE NEGATIVE Final    Comment: (NOTE) The Xpert Xpress SARS-CoV-2/FLU/RSV plus assay is intended as an aid in the diagnosis of influenza from Nasopharyngeal swab specimens and should not be used as a sole basis for treatment. Nasal washings and aspirates are unacceptable for Xpert Xpress SARS-CoV-2/FLU/RSV testing.  Fact Sheet for Patients: EntrepreneurPulse.com.au  Fact Sheet for Healthcare Providers: IncredibleEmployment.be  This test is not yet approved or cleared by the [place] FDA and has been authorized for detection and/or diagnosis of SARS-CoV-2 by FDA under an Emergency Use Authorization (EUA). This EUA will remain in effect (meaning this test can be used) for the duration of the COVID-19 declaration under Section 564(b)(1) of the Act, 21 U.S.C. section 360bbb-3(b)(1), unless the authorization is terminated or revoked.  Performed at [facility], [address], [place],  [address]    Studies/Results: No results found. Medications: I have  reviewed the patient's current medications. Scheduled Meds:  pantoprazole  40 mg Oral BID   senna-docusate  2 tablet Oral BID   Continuous Infusions:  sodium chloride 100 mL/hr at 06/27/21 0033   PRN Meds:.acetaminophen **OR** acetaminophen,  HYDROmorphone (DILAUDID) injection, ondansetron **OR** ondansetron (ZOFRAN) IV, oxyCODONE-acetaminophen   Assessment: Principal Problem:   Intractable abdominal pain Active Problems:   History of ischemic bowel disease   Anxiety   Acute pancreatitis, idiopathic, recurrent   Hypokalemia   Migraines   Ellen Blankenship is a 41 y.o. y/o female presented with abdominal pain similar to what she had in the past.  Normal lipase but had a history of pancreatitis on imaging from 2 days back shows features that could indicate pancreatitis versus retroperitoneal fibrosis.  At this point of time she is positive for COVID-19 ? Precipitating factor for pancreatitis on this episode .  She does give a history of pain right after eating which could indicate a biliary component but RUQ USG was normal   , IgG4 levels. Triglycerides not elevated    Plan 1.  IV PPI and when taking orally can convert to oral PPI 2.  F/u eliac serology, H pylori stool test. Rehydrate with IV fluids and provide analgesia as needed. 3.  As an outpatient she will need to follow-up with her gastroenterologist and I suggested that she undergo an endoscopic ultrasound to evaluate the gallbladder to rule out sludge in the gallbladder as a precipitating factor for pancreatitis.  In addition it will help to evaluate the pancreatic duct and pancreas.  She also might need to be seen by a rheumatologist to remind any causes with retroperitoneal fibrosis and have appropriate follow-up for the same.  I also suggested her to take her health supplements and show it to her gastroenterologist to review the ingredients to see if any of those have been associated with pancreatitis. 4.   Once the phase of COVID has resolved in her could consider upper endoscopy as well but will not perform at this point of time.   I will sign off.  Please call me if any further GI concerns or questions.  We would like to thank you for the opportunity to participate in the  care of Ellen Blankenship.     LOS: 1 day   Jonathon Bellows, MD 06/27/2021, 10:16 AM

## 2021-06-27 NOTE — Progress Notes (Signed)
PROGRESS NOTE    Ellen Blankenship  YQI:[id number] DOB: [birth year] DOA: 06/26/2021 PCP: [facility]   Chief complaint.  Abdominal pain. Brief Narrative:   Ellen Blankenship is a 41 y.o. female with medical history significant for Migraines, anxiety, acute pancreatitis, prolonged hospitalized in 2016 for acute LUQ pain that started during a run, with biopsy-proven ischemic colitis of the splenic flexure, however with patent aortic/mesenteric vasculature, and with recurrent intermittent LUQ pain since, who presents to the ED for the second time in a week with severe epigastric pain associated with nausea. Lipase was mildly elevated, but a CT angiogram showed changes associated with autoimmune pancreatitis.  Patient was placed on fluids and pain medicine.  Assessment & Plan:   Principal Problem:   Intractable abdominal pain Active Problems:   History of ischemic bowel disease   Anxiety   Acute pancreatitis, idiopathic, recurrent   Hypokalemia   Migraines  Intractable abdominal pain. Acute and recurrent pancreatitis. Patient still has significant abdominal pain, requiring IVF and oral pain medicine.  Nausea vomiting subsides, patient was able to tolerate the liquid diet.  Continue oral Protonix and pain medicine. Appreciated GI consult.  Triglycerides not elevated at 35. Patient is not ready for discharge due to significant pain.  Will plan follow-up with GI after discharge, also arrange follow-up with rheumatology as outpatient.   Hypokalemia. Potassium normalized, magnesium level normal.  History of ischemic colitis. No evidence of recurrence at this time.   COVID infection. Patient has no symptoms.  She was COVID-positive 11 days ago, no additional treatment is needed.   DVT prophylaxis: SCDs Code Status: full Family Communication:  Disposition Plan:      Status is: Inpatient   Remains inpatient appropriate because: Severity of disease and IV fluids.      I/O  last 3 completed shifts: In: 1940.1 [I.V.:940.1; IV Piggyback:1000] Out: -  No intake/output data recorded.     Consultants:  GI  Procedures: None  Antimicrobials: None  Subjective: Patient still complaining significant upper abdominal pain, intermittent.  No nausea vomiting, tolerating liquid diet Denies any short of breath cough No fever chills No dysuria hematuria.  Objective: Vitals:   06/26/21 1743 06/26/21 2040 06/27/21 0518 06/27/21 0805  BP: 106/65 106/64 (!) 94/58 (!) 101/57  Pulse: (!) 52 (!) 54 (!) 56 (!) 53  Resp: 16 18 18 16   Temp: 97.9 F (36.6 C) (!) 97.5 F (36.4 C) 97.7 F (36.5 C) 97.8 F (36.6 C)  TempSrc: Oral Oral Oral Oral  SpO2: 95% 99% 99% 98%  Weight:      Height:        Intake/Output Summary (Last 24 hours) at 06/27/2021 0953 Last data filed at 06/26/2021 1500 Gross per 24 hour  Intake 940.1 ml  Output --  Net 940.1 ml   Filed Weights   06/26/21 0151  Weight: 61.2 kg    Examination:  General exam: Appears calm and comfortable  Respiratory system: Clear to auscultation. Respiratory effort normal. Cardiovascular system: S1 & S2 heard, RRR. No JVD, murmurs, rubs, gallops or clicks. No pedal edema. Gastrointestinal system: Abdomen is nondistended, soft and upper abdomen tender. No organomegaly or masses felt. Normal bowel sounds heard. Central nervous system: Alert and oriented. No focal neurological deficits. Extremities: Symmetric 5 x 5 power. Skin: No rashes, lesions or ulcers Psychiatry: Judgement and insight appear normal. Mood & affect appropriate.     Data Reviewed: I have personally reviewed following labs and imaging studies  CBC: Recent Labs  Lab 06/23/21 2059 06/26/21 0200  WBC 7.7 8.1  HGB 14.4 15.2*  HCT 40.8 43.2  MCV 90.3 89.6  PLT 279 419   Basic Metabolic Panel: Recent Labs  Lab 06/23/21 2059 06/26/21 0200 06/26/21 0845 06/27/21 0443  NA 138 135 137 137  K 3.5 3.3* 3.7 4.1  CL 104 100 107 108   CO2 24 24 24 25   GLUCOSE 119* 109* 102* 86  BUN 22* 24* 20 13  CREATININE 0.78 0.92 0.82 0.75  CALCIUM 9.5 9.3 8.3* 8.3*  MG  --   --  2.1 2.2  PHOS  --   --   --  3.8   GFR: Estimated Creatinine Clearance: 82 mL/min (by C-G formula based on SCr of 0.75 mg/dL). Liver Function Tests: Recent Labs  Lab 06/23/21 2059 06/26/21 0200  AST 19 19  ALT 9 8  ALKPHOS 47 45  BILITOT 0.5 0.6  PROT 7.7 8.5*  ALBUMIN 4.4 4.9   Recent Labs  Lab 06/23/21 2059 06/26/21 0200  LIPASE 65* 47   No results for input(s): AMMONIA in the last 168 hours. Coagulation Profile: No results for input(s): INR, PROTIME in the last 168 hours. Cardiac Enzymes: No results for input(s): CKTOTAL, CKMB, CKMBINDEX, TROPONINI in the last 168 hours. BNP (last 3 results) No results for input(s): PROBNP in the last 8760 hours. HbA1C: No results for input(s): HGBA1C in the last 72 hours. CBG: No results for input(s): GLUCAP in the last 168 hours. Lipid Profile: Recent Labs    06/26/21 1522  CHOL 191  HDL 44  LDLCALC 140*  TRIG 35  CHOLHDL 4.3   Thyroid Function Tests: No results for input(s): TSH, T4TOTAL, FREET4, T3FREE, THYROIDAB in the last 72 hours. Anemia Panel: No results for input(s): VITAMINB12, FOLATE, FERRITIN, TIBC, IRON, RETICCTPCT in the last 72 hours. Sepsis Labs: No results for input(s): PROCALCITON, LATICACIDVEN in the last 168 hours.  Recent Results (from the past 240 hour(s))  Resp Panel by RT-PCR (Flu A&B, Covid) Nasopharyngeal Swab     Status: Abnormal   Collection Time: 06/26/21  4:44 AM   Specimen: Nasopharyngeal Swab; Nasopharyngeal(NP) swabs in vial transport medium  Result Value Ref Range Status   SARS Coronavirus 2 by RT PCR POSITIVE (A) NEGATIVE Final    Comment: (NOTE) SARS-CoV-2 target nucleic acids are DETECTED.  The SARS-CoV-2 RNA is generally detectable in upper respiratory specimens during the acute phase of infection. Positive results are indicative of the  presence of the identified virus, but do not rule out bacterial infection or co-infection with other pathogens not detected by the test. Clinical correlation with patient history and other diagnostic information is necessary to determine patient infection status. The expected result is Negative.  Fact Sheet for Patients: EntrepreneurPulse.com.au  Fact Sheet for Healthcare Providers: IncredibleEmployment.be  This test is not yet approved or cleared by the [place] FDA and  has been authorized for detection and/or diagnosis of SARS-CoV-2 by FDA under an Emergency Use Authorization (EUA).  This EUA will remain in effect (meaning this test can be used) for the duration of  the COVID-19 declaration under Section 564(b)(1) of the A ct, 21 U.S.C. section 360bbb-3(b)(1), unless the authorization is terminated or revoked sooner.     Influenza A by PCR NEGATIVE NEGATIVE Final   Influenza B by PCR NEGATIVE NEGATIVE Final    Comment: (NOTE) The Xpert Xpress SARS-CoV-2/FLU/RSV plus assay is intended as an aid in the diagnosis of influenza from Nasopharyngeal swab specimens and should  not be used as a sole basis for treatment. Nasal washings and aspirates are unacceptable for Xpert Xpress SARS-CoV-2/FLU/RSV testing.  Fact Sheet for Patients: EntrepreneurPulse.com.au  Fact Sheet for Healthcare Providers: IncredibleEmployment.be  This test is not yet approved or cleared by the [place] FDA and has been authorized for detection and/or diagnosis of SARS-CoV-2 by FDA under an Emergency Use Authorization (EUA). This EUA will remain in effect (meaning this test can be used) for the duration of the COVID-19 declaration under Section 564(b)(1) of the Act, 21 U.S.C. section 360bbb-3(b)(1), unless the authorization is terminated or revoked.  Performed at [facility], [address], [place], [place]  [address]          Radiology Studies: No results found.      Scheduled Meds:  pantoprazole  40 mg Oral BID   senna-docusate  2 tablet Oral BID   Continuous Infusions:  sodium chloride 100 mL/hr at 06/27/21 0033     LOS: 1 day    Time spent: 27 minutes    Sharen Hones, MD [facility]   To contact the attending provider between 7A-7P or the covering provider during after hours 7P-7A, please log into the web site [email or web address] and access using universal [place] password for that web site. If you do not have the password, please call the hospital operator.  06/27/2021, 9:53 AM

## 2021-06-27 NOTE — Care Management (Signed)
°  Transition of Care [facility]) Screening Note   Patient Details  Name: Ellen Blankenship Date of Birth: Jun 18, 1980   Transition of Care [facility]) CM/SW Contact:    Pete Pelt, RN Phone Number: 06/27/2021, 3:28 PM    Transition of Care Department [facility]) has reviewed patient and no TOC needs have been identified at this time. We will continue to monitor patient advancement through interdisciplinary progression rounds. If new patient transition needs arise, please place a TOC consult.

## 2021-06-28 LAB — BASIC METABOLIC PANEL
Anion gap: 7 (ref 5–15)
BUN: 11 mg/dL (ref 6–20)
CO2: 26 mmol/L (ref 22–32)
Calcium: 8.3 mg/dL — ABNORMAL LOW (ref 8.9–10.3)
Chloride: 105 mmol/L (ref 98–111)
Creatinine, Ser: 0.86 mg/dL (ref 0.44–1.00)
GFR, Estimated: >60 mL/min (ref >60)
Glucose, Bld: 72 mg/dL (ref 70–99)
Potassium: 3.7 mmol/L (ref 3.5–5.1)
Sodium: 138 mmol/L (ref 135–145)

## 2021-06-28 LAB — CELIAC DISEASE PANEL
Endomysial Ab, IgA: NEGATIVE
IgA: 208 mg/dL (ref 87–352)
Tissue Transglutaminase Ab, IgA: <2 U/mL (ref 0–3)

## 2021-06-28 LAB — CBC WITH DIFFERENTIAL/PLATELET
Abs Immature Granulocytes: 0.01 10*3/uL (ref 0.00–0.07)
Basophils Absolute: 0.1 10*3/uL (ref 0.0–0.1)
Basophils Relative: 1 %
Eosinophils Absolute: 0.4 10*3/uL (ref 0.0–0.5)
Eosinophils Relative: 7 %
HCT: 35.7 % — ABNORMAL LOW (ref 36.0–46.0)
Hemoglobin: 12.2 g/dL (ref 12.0–15.0)
Immature Granulocytes: 0 %
Lymphocytes Relative: 44 %
Lymphs Abs: 2.3 10*3/uL (ref 0.7–4.0)
MCH: 31 pg (ref 26.0–34.0)
MCHC: 34.2 g/dL (ref 30.0–36.0)
MCV: 90.6 fL (ref 80.0–100.0)
Monocytes Absolute: 0.4 10*3/uL (ref 0.1–1.0)
Monocytes Relative: 8 %
Neutro Abs: 2.1 10*3/uL (ref 1.7–7.7)
Neutrophils Relative %: 40 %
Platelets: 233 10*3/uL (ref 150–400)
RBC: 3.94 MIL/uL (ref 3.87–5.11)
RDW: 11.7 % (ref 11.5–15.5)
WBC: 5.2 10*3/uL (ref 4.0–10.5)
nRBC: 0 % (ref 0.0–0.2)

## 2021-06-28 LAB — MAGNESIUM: Magnesium: 2.1 mg/dL (ref 1.7–2.4)

## 2021-06-28 LAB — PHOSPHORUS: Phosphorus: 4 mg/dL (ref 2.5–4.6)

## 2021-06-28 NOTE — Progress Notes (Signed)
PROGRESS NOTE    Ellen Blankenship  GYF:[id number] DOB: Mar 05, 1981 DOA: 06/26/2021 PCP: [facility]   Chief complaint.  Abdominal pain. Brief Narrative:   Ellen Blankenship is a 41 y.o. female with medical history significant for Migraines, anxiety, acute pancreatitis, prolonged hospitalized in 2016 for acute LUQ pain that started during a run, with biopsy-proven ischemic colitis of the splenic flexure, however with patent aortic/mesenteric vasculature, and with recurrent intermittent LUQ pain since, who presents to the ED for the second time in a week with severe epigastric pain associated with nausea. Lipase was mildly elevated, but a CT angiogram showed changes associated with autoimmune pancreatitis.  Patient was placed on fluids and pain medicine.    Assessment & Plan:   Principal Problem:   Intractable abdominal pain Active Problems:   History of ischemic bowel disease   Anxiety   Acute pancreatitis, idiopathic, recurrent   Hypokalemia   Migraines  Intractable abdominal pain. Acute and recurrent pancreatitis. Patient still complaining significant abdominal pain, she also had nausea but no vomiting.  I had increased her pain medicine yesterday, also added Phenergan.  She feels better this morning, she was able to tolerating liquid diet since last night.  I will advance to soft low-fat diet.  Continue PPI. Patient may be able to discharge tomorrow.  Will plan follow-up with GI after discharge, also arrange follow-up with rheumatology as outpatient.   Hypokalemia. Normalized.  Recheck level tomorrow.  History of ischemic colitis. No evidence of recurrence at this time.   COVID infection. Still has no respiratory symptoms, no hypoxia.   DVT prophylaxis: SCDs Code Status: full Family Communication: Husband at bedside. Disposition Plan:      Status is: Inpatient   Remains inpatient appropriate because: Severity of disease and IV fluids.        I/O last 3  completed shifts: In: 68 [IV Piggyback:50] Out: -  No intake/output data recorded.     Consultants:  GI  Procedures: None  Antimicrobials: none  Subjective: Patient had increased pain and nausea yesterday.  Pain medicine was increased in dose, also added Phenergan for nausea. Her symptom was better since last night, she is tolerating liquid diet. Denies any short of breath or cough. No fever or chills.  Objective: Vitals:   06/27/21 1611 06/27/21 1959 06/28/21 0402 06/28/21 0802  BP: 107/65 (!) 103/59 100/68 105/66  Pulse: (!) 51 (!) 56 (!) 54 (!) 51  Resp: 16 18 18 17   Temp: 97.9 F (36.6 C) 98 F (36.7 C) (!) 97.4 F (36.3 C) 98 F (36.7 C)  TempSrc: Oral Oral Oral   SpO2: 97% 100% 98% 97%  Weight:      Height:        Intake/Output Summary (Last 24 hours) at 06/28/2021 1053 Last data filed at 06/27/2021 2152 Gross per 24 hour  Intake 50 ml  Output --  Net 50 ml   Filed Weights   06/26/21 0151  Weight: 61.2 kg    Examination:  General exam: Appears calm and comfortable  Respiratory system: Clear to auscultation. Respiratory effort normal. Cardiovascular system: S1 & S2 heard, RRR. No JVD, murmurs, rubs, gallops or clicks. No pedal edema. Gastrointestinal system: Abdomen is nondistended, soft and upper abdomen tender. No organomegaly or masses felt. Normal bowel sounds heard. Central nervous system: Alert and oriented. No focal neurological deficits. Extremities: Symmetric 5 x 5 power. Skin: No rashes, lesions or ulcers Psychiatry: Judgement and insight appear normal. Mood & affect appropriate.  Data Reviewed: I have personally reviewed following labs and imaging studies  CBC: Recent Labs  Lab 06/23/21 2059 06/26/21 0200 06/28/21 0714  WBC 7.7 8.1 5.2  NEUTROABS  --   --  2.1  HGB 14.4 15.2* 12.2  HCT 40.8 43.2 35.7*  MCV 90.3 89.6 90.6  PLT 279 335 308   Basic Metabolic Panel: Recent Labs  Lab 06/23/21 2059 06/26/21 0200  06/26/21 0845 06/27/21 0443 06/28/21 0714  NA 138 135 137 137 138  K 3.5 3.3* 3.7 4.1 3.7  CL 104 100 107 108 105  CO2 24 24 24 25 26   GLUCOSE 119* 109* 102* 86 72  BUN 22* 24* 20 13 11   CREATININE 0.78 0.92 0.82 0.75 0.86  CALCIUM 9.5 9.3 8.3* 8.3* 8.3*  MG  --   --  2.1 2.2 2.1  PHOS  --   --   --  3.8 4.0   GFR: Estimated Creatinine Clearance: 76.3 mL/min (by C-G formula based on SCr of 0.86 mg/dL). Liver Function Tests: Recent Labs  Lab 06/23/21 2059 06/26/21 0200  AST 19 19  ALT 9 8  ALKPHOS 47 45  BILITOT 0.5 0.6  PROT 7.7 8.5*  ALBUMIN 4.4 4.9   Recent Labs  Lab 06/23/21 2059 06/26/21 0200  LIPASE 65* 47   No results for input(s): AMMONIA in the last 168 hours. Coagulation Profile: No results for input(s): INR, PROTIME in the last 168 hours. Cardiac Enzymes: No results for input(s): CKTOTAL, CKMB, CKMBINDEX, TROPONINI in the last 168 hours. BNP (last 3 results) No results for input(s): PROBNP in the last 8760 hours. HbA1C: No results for input(s): HGBA1C in the last 72 hours. CBG: No results for input(s): GLUCAP in the last 168 hours. Lipid Profile: Recent Labs    06/26/21 1522  CHOL 191  HDL 44  LDLCALC 140*  TRIG 35  CHOLHDL 4.3   Thyroid Function Tests: No results for input(s): TSH, T4TOTAL, FREET4, T3FREE, THYROIDAB in the last 72 hours. Anemia Panel: No results for input(s): VITAMINB12, FOLATE, FERRITIN, TIBC, IRON, RETICCTPCT in the last 72 hours. Sepsis Labs: No results for input(s): PROCALCITON, LATICACIDVEN in the last 168 hours.  Recent Results (from the past 240 hour(s))  Resp Panel by RT-PCR (Flu A&B, Covid) Nasopharyngeal Swab     Status: Abnormal   Collection Time: 06/26/21  4:44 AM   Specimen: Nasopharyngeal Swab; Nasopharyngeal(NP) swabs in vial transport medium  Result Value Ref Range Status   SARS Coronavirus 2 by RT PCR POSITIVE (A) NEGATIVE Final    Comment: (NOTE) SARS-CoV-2 target nucleic acids are DETECTED.  The  SARS-CoV-2 RNA is generally detectable in upper respiratory specimens during the acute phase of infection. Positive results are indicative of the presence of the identified virus, but do not rule out bacterial infection or co-infection with other pathogens not detected by the test. Clinical correlation with patient history and other diagnostic information is necessary to determine patient infection status. The expected result is Negative.  Fact Sheet for Patients: EntrepreneurPulse.com.au  Fact Sheet for Healthcare Providers: IncredibleEmployment.be  This test is not yet approved or cleared by the [place] FDA and  has been authorized for detection and/or diagnosis of SARS-CoV-2 by FDA under an Emergency Use Authorization (EUA).  This EUA will remain in effect (meaning this test can be used) for the duration of  the COVID-19 declaration under Section 564(b)(1) of the A ct, 21 U.S.C. section 360bbb-3(b)(1), unless the authorization is terminated or revoked sooner.  Influenza A by PCR NEGATIVE NEGATIVE Final   Influenza B by PCR NEGATIVE NEGATIVE Final    Comment: (NOTE) The Xpert Xpress SARS-CoV-2/FLU/RSV plus assay is intended as an aid in the diagnosis of influenza from Nasopharyngeal swab specimens and should not be used as a sole basis for treatment. Nasal washings and aspirates are unacceptable for Xpert Xpress SARS-CoV-2/FLU/RSV testing.  Fact Sheet for Patients: EntrepreneurPulse.com.au  Fact Sheet for Healthcare Providers: IncredibleEmployment.be  This test is not yet approved or cleared by the [place] FDA and has been authorized for detection and/or diagnosis of SARS-CoV-2 by FDA under an Emergency Use Authorization (EUA). This EUA will remain in effect (meaning this test can be used) for the duration of the COVID-19 declaration under Section 564(b)(1) of the Act, 21 U.S.C. section  360bbb-3(b)(1), unless the authorization is terminated or revoked.  Performed at [facility] [address], [place], [place] [address]          Radiology Studies: US Abdomen Limited RUQ (LIVER/GB)  Result Date: 06/27/2021 CLINICAL DATA:  Pancreatitis EXAM: ULTRASOUND ABDOMEN LIMITED RIGHT UPPER QUADRANT COMPARISON:  CT done on 06/24/2021 FINDINGS: Gallbladder: No gallstones or wall thickening visualized. No sonographic Murphy sign noted by sonographer. Common bile duct: Diameter: 3.1 Liver: No focal lesion identified. Within normal limits in parenchymal echogenicity. Portal vein is patent on color Doppler imaging with normal direction of blood flow towards the liver. Other: None. IMPRESSION: No sonographic abnormality is seen in right upper quadrant of abdomen. Electronically Signed   By: Elmer Picker M.D.   On: 06/27/2021 13:17        Scheduled Meds:  pantoprazole  40 mg Oral BID   senna-docusate  2 tablet Oral BID   Continuous Infusions:  sodium chloride 100 mL/hr at 06/28/21 0342   promethazine (PHENERGAN) injection (IM or IVPB) 12.5 mg (06/27/21 2152)     LOS: 2 days    Time spent: 27 minutes    Sharen Hones, MD [facility]   To contact the attending provider between 7A-7P or the covering provider during after hours 7P-7A, please log into the web site [email or web address] and access using universal [place] password for that web site. If you do not have the password, please call the hospital operator.  06/28/2021, 10:53 AM

## 2021-06-29 LAB — MAGNESIUM: Magnesium: 1.9 mg/dL (ref 1.7–2.4)

## 2021-06-29 LAB — BASIC METABOLIC PANEL
Anion gap: 6 (ref 5–15)
BUN: 10 mg/dL (ref 6–20)
CO2: 24 mmol/L (ref 22–32)
Calcium: 8.2 mg/dL — ABNORMAL LOW (ref 8.9–10.3)
Chloride: 110 mmol/L (ref 98–111)
Creatinine, Ser: 0.74 mg/dL (ref 0.44–1.00)
GFR, Estimated: >60 mL/min (ref >60)
Glucose, Bld: 85 mg/dL (ref 70–99)
Potassium: 3.7 mmol/L (ref 3.5–5.1)
Sodium: 140 mmol/L (ref 135–145)

## 2021-06-29 MED ORDER — HYDROMORPHONE HCL 1 MG/ML IJ SOLN
1.0000 mg | Freq: Once | INTRAMUSCULAR | Status: AC
  Administered 2021-06-29: 1 mg via INTRAVENOUS
  Filled 2021-06-29: qty 1

## 2021-06-29 MED ORDER — SUCRALFATE 1 G PO TABS: 1.0000 g | ORAL_TABLET | Freq: Three times a day (TID) | ORAL | Status: DC

## 2021-06-29 MED ORDER — SUCRALFATE 1 G PO TABS
1.0000 g | ORAL_TABLET | Freq: Three times a day (TID) | ORAL | Status: DC
  Administered 2021-06-30 (×2): 1 g via ORAL
  Filled 2021-06-29 (×3): qty 1

## 2021-06-29 MED ORDER — SODIUM CHLORIDE 0.9 % IV SOLN: INTRAVENOUS | Status: DC

## 2021-06-29 MED ORDER — PANCRELIPASE (LIP-PROT-AMYL) 12000-38000 UNITS PO CPEP
24000.0000 [IU] | ORAL_CAPSULE | Freq: Three times a day (TID) | ORAL | Status: DC
  Administered 2021-06-29 – 2021-06-30 (×4): 24000 [IU] via ORAL
  Filled 2021-06-29 (×4): qty 2

## 2021-06-29 MED ORDER — HYDROMORPHONE HCL 1 MG/ML IJ SOLN: 0.5000 mg | Freq: Three times a day (TID) | INTRAMUSCULAR | Status: DC | PRN

## 2021-06-29 NOTE — Progress Notes (Signed)
PROGRESS NOTE    Ellen Blankenship  SAY:[id number] DOB: August 25, 1980 DOA: 06/26/2021 PCP: [facility]   Chief complaint abdominal pain. Brief Narrative:   Ellen Blankenship is a 41 y.o. female with medical history significant for Migraines, anxiety, acute pancreatitis, prolonged hospitalized in 2016 for acute LUQ pain that started during a run, with biopsy-proven ischemic colitis of the splenic flexure, however with patent aortic/mesenteric vasculature, and with recurrent intermittent LUQ pain since, who presents to the ED for the second time in a week with severe epigastric pain associated with nausea. Lipase was mildly elevated, but a CT angiogram showed changes associated with autoimmune pancreatitis.  Patient was placed on fluids and pain medicine.  Assessment & Plan:   Principal Problem:   Intractable abdominal pain Active Problems:   History of ischemic bowel disease   Anxiety   Acute pancreatitis, idiopathic, recurrent   Hypokalemia   Migraines  Intractable abdominal pain. Acute and recurrent pancreatitis. Patient diet was advanced to soft, however, she still have significant pain requiring pain medicine.  She could not eat today, feel abdomen distended.  She had 3 loose stools yesterday. Not able to discharge patient today, will add Creon to the regimen.  Continue IV fluids.  I will continue oral pain medicine, decrease the frequency of IV pain medicine to every 8 hours as needed. Most likely discharge home tomorrow.  Will plan follow-up with GI after discharge, also arrange follow-up with rheumatology as outpatient.   Hypokalemia. Continue to monitor.  History of ischemic colitis. No evidence of recurrence at this time.   COVID infection. Still has no symptoms.  No hypoxemia.   DVT prophylaxis: SCDs Code Status: full Family Communication:  Disposition Plan:      Status is: Inpatient   Remains inpatient appropriate because: Severity of disease and IV  fluids.         I/O last 3 completed shifts: In: 61 [IV Piggyback:50] Out: -  No intake/output data recorded.     Consultants:  GI  Procedures: None  Antimicrobials: None  Subjective: Patient continued has intermittent abdominal pain, occasional nausea without vomiting. She required 1 more dose IV pain medicine last night.  She developed abdominal distention after eating a soft diet this morning. She had some loose stools yesterday. Denies any short of breath or cough.  Objective: Vitals:   06/28/21 1553 06/28/21 2122 06/29/21 0630 06/29/21 0846  BP: (!) 100/52 (!) 98/58 125/71 96/60  Pulse: (!) 51 (!) 55 (!) 58 (!) 54  Resp: 17 16 18 16   Temp: 98.1 F (36.7 C) 98.6 F (37 C) 97.6 F (36.4 C) 97.9 F (36.6 C)  TempSrc: Oral  Oral Oral  SpO2: 98% 99%  98%  Weight:      Height:       No intake or output data in the 24 hours ending 06/29/21 1049 Filed Weights   06/26/21 0151  Weight: 61.2 kg    Examination:  General exam: Appears calm and comfortable  Respiratory system: Clear to auscultation. Respiratory effort normal. Cardiovascular system: S1 & S2 heard, RRR. No JVD, murmurs, rubs, gallops or clicks. No pedal edema. Gastrointestinal system: Abdomen is nondistended, soft and gastric tender. No organomegaly or masses felt. Normal bowel sounds heard. Central nervous system: Alert and oriented. No focal neurological deficits. Extremities: Symmetric 5 x 5 power. Skin: No rashes, lesions or ulcers Psychiatry: Judgement and insight appear normal. Mood & affect appropriate.     Data Reviewed: I have personally reviewed following  labs and imaging studies  CBC: Recent Labs  Lab 06/23/21 2059 06/26/21 0200 06/28/21 0714  WBC 7.7 8.1 5.2  NEUTROABS  --   --  2.1  HGB 14.4 15.2* 12.2  HCT 40.8 43.2 35.7*  MCV 90.3 89.6 90.6  PLT 279 335 096   Basic Metabolic Panel: Recent Labs  Lab 06/26/21 0200 06/26/21 0845 06/27/21 0443 06/28/21 0714  06/29/21 0655  NA 135 137 137 138 140  K 3.3* 3.7 4.1 3.7 3.7  CL 100 107 108 105 110  CO2 24 24 25 26 24   GLUCOSE 109* 102* 86 72 85  BUN 24* 20 13 11 10   CREATININE 0.92 0.82 0.75 0.86 0.74  CALCIUM 9.3 8.3* 8.3* 8.3* 8.2*  MG  --  2.1 2.2 2.1 1.9  PHOS  --   --  3.8 4.0  --    GFR: Estimated Creatinine Clearance: 82 mL/min (by C-G formula based on SCr of 0.74 mg/dL). Liver Function Tests: Recent Labs  Lab 06/23/21 2059 06/26/21 0200  AST 19 19  ALT 9 8  ALKPHOS 47 45  BILITOT 0.5 0.6  PROT 7.7 8.5*  ALBUMIN 4.4 4.9   Recent Labs  Lab 06/23/21 2059 06/26/21 0200  LIPASE 65* 47   No results for input(s): AMMONIA in the last 168 hours. Coagulation Profile: No results for input(s): INR, PROTIME in the last 168 hours. Cardiac Enzymes: No results for input(s): CKTOTAL, CKMB, CKMBINDEX, TROPONINI in the last 168 hours. BNP (last 3 results) No results for input(s): PROBNP in the last 8760 hours. HbA1C: No results for input(s): HGBA1C in the last 72 hours. CBG: No results for input(s): GLUCAP in the last 168 hours. Lipid Profile: Recent Labs    06/26/21 1522  CHOL 191  HDL 44  LDLCALC 140*  TRIG 35  CHOLHDL 4.3   Thyroid Function Tests: No results for input(s): TSH, T4TOTAL, FREET4, T3FREE, THYROIDAB in the last 72 hours. Anemia Panel: No results for input(s): VITAMINB12, FOLATE, FERRITIN, TIBC, IRON, RETICCTPCT in the last 72 hours. Sepsis Labs: No results for input(s): PROCALCITON, LATICACIDVEN in the last 168 hours.  Recent Results (from the past 240 hour(s))  Resp Panel by RT-PCR (Flu A&B, Covid) Nasopharyngeal Swab     Status: Abnormal   Collection Time: 06/26/21  4:44 AM   Specimen: Nasopharyngeal Swab; Nasopharyngeal(NP) swabs in vial transport medium  Result Value Ref Range Status   SARS Coronavirus 2 by RT PCR POSITIVE (A) NEGATIVE Final    Comment: (NOTE) SARS-CoV-2 target nucleic acids are DETECTED.  The SARS-CoV-2 RNA is generally detectable  in upper respiratory specimens during the acute phase of infection. Positive results are indicative of the presence of the identified virus, but do not rule out bacterial infection or co-infection with other pathogens not detected by the test. Clinical correlation with patient history and other diagnostic information is necessary to determine patient infection status. The expected result is Negative.  Fact Sheet for Patients: EntrepreneurPulse.com.au  Fact Sheet for Healthcare Providers: IncredibleEmployment.be  This test is not yet approved or cleared by the [place] FDA and  has been authorized for detection and/or diagnosis of SARS-CoV-2 by FDA under an Emergency Use Authorization (EUA).  This EUA will remain in effect (meaning this test can be used) for the duration of  the COVID-19 declaration under Section 564(b)(1) of the A ct, 21 U.S.C. section 360bbb-3(b)(1), unless the authorization is terminated or revoked sooner.     Influenza A by PCR NEGATIVE NEGATIVE Final  Influenza B by PCR NEGATIVE NEGATIVE Final    Comment: (NOTE) The Xpert Xpress SARS-CoV-2/FLU/RSV plus assay is intended as an aid in the diagnosis of influenza from Nasopharyngeal swab specimens and should not be used as a sole basis for treatment. Nasal washings and aspirates are unacceptable for Xpert Xpress SARS-CoV-2/FLU/RSV testing.  Fact Sheet for Patients: EntrepreneurPulse.com.au  Fact Sheet for Healthcare Providers: IncredibleEmployment.be  This test is not yet approved or cleared by the [place] FDA and has been authorized for detection and/or diagnosis of SARS-CoV-2 by FDA under an Emergency Use Authorization (EUA). This EUA will remain in effect (meaning this test can be used) for the duration of the COVID-19 declaration under Section 564(b)(1) of the Act, 21 U.S.C. section 360bbb-3(b)(1), unless the authorization  is terminated or revoked.  Performed at [facility], [address], [place], [place] [address]          Radiology Studies: No results found.      Scheduled Meds:  lipase/protease/amylase  24,000 Units Oral TID AC   pantoprazole  40 mg Oral BID   Continuous Infusions:  sodium chloride 100 mL/hr at 06/29/21 1032   promethazine (PHENERGAN) injection (IM or IVPB) 12.5 mg (06/27/21 2152)     LOS: 3 days    Time spent: 27 minutes    Sharen Hones, MD [facility]   To contact the attending provider between 7A-7P or the covering provider during after hours 7P-7A, please log into the web site [email or web address] and access using universal Luke password for that web site. If you do not have the password, please call the hospital operator.  06/29/2021, 10:49 AM

## 2021-06-29 NOTE — Progress Notes (Signed)
Pt complaining of bloating and feels it has increased since this morning. She wants med ordered. MD made aware, PRN order placed.

## 2021-06-30 LAB — BASIC METABOLIC PANEL
Anion gap: 6 (ref 5–15)
BUN: 10 mg/dL (ref 6–20)
CO2: 26 mmol/L (ref 22–32)
Calcium: 8.7 mg/dL — ABNORMAL LOW (ref 8.9–10.3)
Chloride: 107 mmol/L (ref 98–111)
Creatinine, Ser: 0.81 mg/dL (ref 0.44–1.00)
GFR, Estimated: >60 mL/min (ref >60)
Glucose, Bld: 85 mg/dL (ref 70–99)
Potassium: 4.1 mmol/L (ref 3.5–5.1)
Sodium: 139 mmol/L (ref 135–145)

## 2021-06-30 LAB — H. PYLORI ANTIGEN, STOOL: H. Pylori Stool Ag, Eia: NEGATIVE

## 2021-06-30 LAB — MAGNESIUM: Magnesium: 2.1 mg/dL (ref 1.7–2.4)

## 2021-06-30 MED ORDER — SUCRALFATE 1 G PO TABS: 1.0000 g | ORAL_TABLET | Freq: Three times a day (TID) | ORAL | 0 refills | Status: AC

## 2021-06-30 MED ORDER — OXYCODONE-ACETAMINOPHEN 10-325 MG PO TABS: 1.0000 | ORAL_TABLET | Freq: Four times a day (QID) | ORAL | 0 refills | Status: AC | PRN

## 2021-06-30 MED ORDER — OXYCODONE-ACETAMINOPHEN 10-325 MG PO TABS
1.0000 | ORAL_TABLET | Freq: Four times a day (QID) | ORAL | 0 refills | Status: AC | PRN
Start: 2021-06-30 — End: 2022-06-30

## 2021-06-30 MED ORDER — PANCREAZE 10500-35500 UNITS PO CPEP: 1.0000 | ORAL_CAPSULE | Freq: Three times a day (TID) | ORAL | 0 refills | Status: AC

## 2021-06-30 MED ORDER — PANTOPRAZOLE SODIUM 40 MG PO TBEC: 40.0000 mg | DELAYED_RELEASE_TABLET | Freq: Two times a day (BID) | ORAL | 0 refills | Status: AC

## 2021-06-30 NOTE — Progress Notes (Signed)
Ellen Blankenship to be D/C'd Home per MD order.  Discussed prescriptions and follow up appointments with the patient. Prescriptions given to patient, medication list explained in detail. Pt verbalized understanding.  Allergies as of 06/30/2021       Reactions   Sulfa Antibiotics Rash   Other reaction(s): Other (See Comments) Blisters in eye with sulfa eye drops   Venlafaxine Swelling   She reports throat and tongue swelling   Paroxetine Other (See Comments)   Non-allergic side effects Non-allergic side effects        Medication List     STOP taking these medications    oxyCODONE-acetaminophen 5-325 MG tablet Commonly known as: Percocet Replaced by: oxyCODONE-acetaminophen 10-325 MG tablet       TAKE these medications    butalbital-acetaminophen-caffeine 50-325-40 MG tablet Commonly known as: FIORICET Take 1 tablet by mouth 2 (two) times daily as needed.   escitalopram 10 MG tablet Commonly known as: LEXAPRO Take 10 mg by mouth daily.   loratadine 10 MG tablet Commonly known as: CLARITIN Take 10 mg by mouth daily.   LORazepam 0.5 MG tablet Commonly known as: Ativan Take 1 tablet (0.5 mg total) by mouth 2 (two) times daily. Prn sx   ondansetron 4 MG tablet Commonly known as: Zofran Take 1 tablet (4 mg total) by mouth daily as needed for nausea or vomiting.   oxyCODONE-acetaminophen 10-325 MG tablet Commonly known as: Percocet Take 1 tablet by mouth every 6 (six) hours as needed for pain. Replaces: oxyCODONE-acetaminophen 5-325 MG tablet   oxyCODONE-acetaminophen 10-325 MG tablet Commonly known as: Percocet Take 1 tablet by mouth every 6 (six) hours as needed for pain.   Pancreaze 10500-35500 units Cpep Generic drug: Pancrelipase (Lip-Prot-Amyl) Take 1 capsule by mouth 3 (three) times daily before meals for 7 days.   pantoprazole 40 MG tablet Commonly known as: PROTONIX Take 1 tablet (40 mg total) by mouth 2 (two) times daily.   propranolol 10 MG  tablet Commonly known as: INDERAL Take 10 mg by mouth daily as needed.   sucralfate 1 g tablet Commonly known as: CARAFATE Take 1 tablet (1 g total) by mouth 4 (four) times daily -  with meals and at bedtime for 20 days.        Vitals:   06/30/21 0635 06/30/21 0826  BP: 112/73 111/67  Pulse: (!) 51 (!) 53  Resp: 16 18  Temp: 98 F (36.7 C) 98.1 F (36.7 C)  SpO2: 98% 98%    Skin clean, dry and intact without evidence of skin break down, no evidence of skin tears noted. IV catheter discontinued intact. Site without signs and symptoms of complications. Dressing and pressure applied. Pt denies pain at this time. No complaints noted.  An After Visit Summary was printed and given to the patient. Patient escorted via [place], and D/C home via private auto.  Rolley Sims

## 2021-06-30 NOTE — Discharge Summary (Signed)
Physician Discharge Summary  Patient ID: Ellen Blankenship MRN: [id number] DOB/AGE: 39/21/1982 39 y.o.  Admit date: 06/26/2021 Discharge date: 06/30/2021  Admission Diagnoses:  Discharge Diagnoses:  Principal Problem:   Intractable abdominal pain Active Problems:   History of ischemic bowel disease   Anxiety   Acute pancreatitis, idiopathic, recurrent   Hypokalemia   Migraines   Discharged Condition: good  Hospital Course:  Ellen Blankenship is a 41 y.o. female with medical history significant for Migraines, anxiety, acute pancreatitis, prolonged hospitalized in 2016 for acute LUQ pain that started during a run, with biopsy-proven ischemic colitis of the splenic flexure, however with patent aortic/mesenteric vasculature, and with recurrent intermittent LUQ pain since, who presents to the ED for the second time in a week with severe epigastric pain associated with nausea. Lipase was mildly elevated, but a CT angiogram showed changes associated with autoimmune pancreatitis.  Patient was placed on fluids and pain medicine.  Intractable abdominal pain. Acute and recurrent pancreatitis. Patient has been tolerating soft diet, she still have intermittent abdominal pain.  But nausea is better.  At this point, she is medically stable to be discharged.  She will be placed on as needed pain medicine for a few days, I will also give her a few days of Protonix, sucralfate.   Will plan follow-up with GI after discharge, also arrange follow-up with rheumatology as outpatient.   Hypokalemia. Resolved  History of ischemic colitis. No evidence of recurrence at this time.   COVID infection. Still has no symptoms.  No hypoxemia.  Consults: GI  Significant Diagnostic Studies:   Treatments: IVF, supportive treatment.  Discharge Exam: Blood pressure 111/67, pulse (!) 53, temperature 98.1 F (36.7 C), temperature source Oral, resp. rate 18, height 5\' 2"  (1.575 m), weight 61.2 kg, last menstrual  period 06/21/2021, SpO2 98 %. General appearance: alert and cooperative Resp: clear to auscultation bilaterally Cardio: regular rate and rhythm, S1, S2 normal, no murmur, click, rub or gallop GI: soft, mild tenderness at gastric area Extremities: extremities normal, atraumatic, no cyanosis or edema  Disposition: Discharge disposition: 01-Home or Self Care       Discharge Instructions     Diet general   Complete by: As directed    Soft, low fat diet x3 days then regular heart healthy   Increase activity slowly   Complete by: As directed       Allergies as of 06/30/2021       Reactions   Sulfa Antibiotics Rash   Other reaction(s): Other (See Comments) Blisters in eye with sulfa eye drops   Venlafaxine Swelling   She reports throat and tongue swelling   Paroxetine Other (See Comments)   Non-allergic side effects Non-allergic side effects        Medication List     STOP taking these medications    oxyCODONE-acetaminophen 5-325 MG tablet Commonly known as: Percocet Replaced by: oxyCODONE-acetaminophen 10-325 MG tablet       TAKE these medications    butalbital-acetaminophen-caffeine 50-325-40 MG tablet Commonly known as: FIORICET Take 1 tablet by mouth 2 (two) times daily as needed.   escitalopram 10 MG tablet Commonly known as: LEXAPRO Take 10 mg by mouth daily.   loratadine 10 MG tablet Commonly known as: CLARITIN Take 10 mg by mouth daily.   LORazepam 0.5 MG tablet Commonly known as: Ativan Take 1 tablet (0.5 mg total) by mouth 2 (two) times daily. Prn sx   ondansetron 4 MG tablet Commonly known as: Zofran Take 1  tablet (4 mg total) by mouth daily as needed for nausea or vomiting.   oxyCODONE-acetaminophen 10-325 MG tablet Commonly known as: Percocet Take 1 tablet by mouth every 6 (six) hours as needed for pain. Replaces: oxyCODONE-acetaminophen 5-325 MG tablet   Pancreaze 10500-35500 units Cpep Generic drug: Pancrelipase  (Lip-Prot-Amyl) Take 1 capsule by mouth 3 (three) times daily before meals for 7 days.   pantoprazole 40 MG tablet Commonly known as: PROTONIX Take 1 tablet (40 mg total) by mouth 2 (two) times daily.   propranolol 10 MG tablet Commonly known as: INDERAL Take 10 mg by mouth daily as needed.   sucralfate 1 g tablet Commonly known as: CARAFATE Take 1 tablet (1 g total) by mouth 4 (four) times daily -  with meals and at bedtime for 20 days.        Follow-up Information     [facility] Follow up in 1 week(s).   Specialty: Family Medicine Contact information: [address] [place] [address] [phone]         Jonathon Bellows, MD Follow up in 1 week(s).   Specialty: Gastroenterology Contact information: [place] [address] [phone]         Quintin Alto, MD Follow up in 4 week(s).   Specialty: Rheumatology Contact information: [place] [address] [phone]                32 minutes Signed: Sharen Hones 06/30/2021, 10:44 AM

## 2023-01-23 ENCOUNTER — Telehealth: Payer: Self-pay | Admitting: Obstetrics and Gynecology

## 2023-01-23 NOTE — Telephone Encounter (Signed)
Hello Ellen Blankenship, Patient listed above last saw you in 2020, and would like to see you again.  Would you consider reestablishing her with you?  Please Advise.  Sincerely, Charlene

## 2023-01-25 NOTE — Telephone Encounter (Signed)
Yes, pls schedule. Thx.

## 2023-04-09 ENCOUNTER — Ambulatory Visit: Payer: Managed Care, Other (non HMO) | Admitting: Obstetrics

## 2023-04-09 ENCOUNTER — Other Ambulatory Visit (HOSPITAL_COMMUNITY)
Admission: RE | Admit: 2023-04-09 | Discharge: 2023-04-09 | Disposition: A | Discharge status: Home / Self Care | Payer: Managed Care, Other (non HMO) | Source: Ambulatory Visit | Attending: Obstetrics | Admitting: Obstetrics

## 2023-04-09 ENCOUNTER — Encounter: Payer: Self-pay | Admitting: Obstetrics

## 2023-04-09 VITALS — BP 110/82 | HR 70 | Ht 62.0 in | Wt 141.0 lb

## 2023-04-09 DIAGNOSIS — Z113 Encounter for screening for infections with a predominantly sexual mode of transmission: Secondary | ICD-10-CM | POA: Insufficient documentation

## 2023-04-09 DIAGNOSIS — Z124 Encounter for screening for malignant neoplasm of cervix: Secondary | ICD-10-CM | POA: Insufficient documentation

## 2023-04-09 DIAGNOSIS — Z01419 Encounter for gynecological examination (general) (routine) without abnormal findings: Secondary | ICD-10-CM

## 2023-04-09 DIAGNOSIS — Z1231 Encounter for screening mammogram for malignant neoplasm of breast: Secondary | ICD-10-CM

## 2023-04-09 DIAGNOSIS — N939 Abnormal uterine and vaginal bleeding, unspecified: Secondary | ICD-10-CM

## 2023-04-09 NOTE — Progress Notes (Signed)
ANNUAL GYNECOLOGICAL EXAM  SUBJECTIVE  HPI  Ellen Blankenship is a 42 y.o.-year-old [id number] who presents for an annual gynecological exam today.  She denies pelvic pain, dyspareunia, abnormal vaginal discharge, and UTI symptoms. She has concerns about breakthrough bleeding. She had midcycle spotting during her previous cycle. She has also had postcoital bleeding on a few occasions.  She has regular monthly cycles with a moderate bleeding. She is not on any hormonal contraceptives.She has a history of pancreatitis and ischemic bowel.  Medical/Surgical History Past Medical History:  Diagnosis Date   Anxiety    History of Papanicolaou smear of cervix 06/16/2011   -/- NEG   Ischemic colitis (HCC)    HOSPITALIZED 06/2014   Migraine with aura    Pancreatitis    POSS D/T WATER CONTAMINATION. PT HAS NOT HAD SXS SINCE CHANGING TO FILTERED/BOTTLED WATER   Pars defect of lumbar spine    Past Surgical History:  Procedure Laterality Date   ARTHROSCOPIC REPAIR ACL Left    ELBOW SURGERY     LEFT   WISDOM TOOTH EXTRACTION      Social History Lives with husband and 3 children. Feels safe there Work: Chief Financial Officer for [employer] Exercise: walking, weight lifting, spin class Substances: Denies EtOH, tobacco, vape, and recreational drugs  Obstetric History OB History     Gravida  3   Para  3   Term  3   Preterm      AB      Living  3      SAB      IAB      Ectopic      Multiple  0   Live Births  3            GYN/Menstrual History Patient's last menstrual period was 03/23/2023 (approximate). Regular monthly periods Last Pap: 2017? Contraception: Vasectomy  Prevention Dentist: regular visits Eye exam: plans to schdule Mammogram: had one a diagnostic mammo/US a few years ago Colonoscopy: has had several  Current Medications Outpatient Medications Prior to Visit  Medication Sig   butalbital-acetaminophen-caffeine (FIORICET) 50-325-40 MG tablet Take 1  tablet by mouth 2 (two) times daily as needed.   loratadine (CLARITIN) 10 MG tablet Take 10 mg by mouth daily.   LORazepam (ATIVAN) 0.5 MG tablet Take 1 tablet (0.5 mg total) by mouth 2 (two) times daily. Prn sx   oxyCODONE-acetaminophen (PERCOCET) 10-325 MG tablet Take 1 tablet by mouth every 6 (six) hours as needed for pain.   pantoprazole (PROTONIX) 40 MG tablet Take 1 tablet (40 mg total) by mouth 2 (two) times daily.   escitalopram (LEXAPRO) 10 MG tablet Take 10 mg by mouth daily.   propranolol (INDERAL) 10 MG tablet Take 10 mg by mouth daily as needed.   sucralfate (CARAFATE) 1 g tablet Take 1 tablet (1 g total) by mouth 4 (four) times daily -  with meals and at bedtime for 20 days.   No facility-administered medications prior to visit.      The pregnancy intention screening data noted above was reviewed. Potential methods of contraception were discussed. The patient elected to proceed with No data recorded.   ROS Constitutional: Denied constitutional symptoms, night sweats, recent illness, fatigue, fever, insomnia and weight loss.  Eyes: Denied eye symptoms, eye pain, photophobia, vision change and visual disturbance.  Ears/Nose/Throat/Neck: Denied ear, nose, throat or neck symptoms, hearing loss, nasal discharge, sinus congestion and sore throat.  Cardiovascular: Denied cardiovascular symptoms, arrhythmia, chest pain/pressure, edema, exercise intolerance,  orthopnea and palpitations.  Respiratory: Denied pulmonary symptoms, asthma, pleuritic pain, productive sputum, cough, dyspnea and wheezing.  Gastrointestinal: Denied, gastro-esophageal reflux, melena, nausea and vomiting.  Genitourinary: See HPI  Musculoskeletal: Denied musculoskeletal symptoms, stiffness, swelling, muscle weakness and myalgia.  Dermatologic: Denied dermatology symptoms, rash and scar.  Neurologic: Denied neurology symptoms, dizziness, headache, neck pain and syncope.  Psychiatric: Anxiety managed with medications    Endocrine: Denied endocrine symptoms including hot flashes and night sweats.    OBJECTIVE  BP 110/82   Pulse 70   Ht 5\' 2"  (1.575 m)   Wt 141 lb (64 kg)   LMP 03/23/2023 (Approximate)   BMI 25.79 kg/m    Physical examination General NAD, Conversant  HEENT Atraumatic.  Normo-cephalic. Pupils reactive. Anicteric sclerae  Thyroid/Neck Smooth without nodularity or enlargement. Normal ROM.  Neck Supple.  Skin No rashes, lesions or ulceration. Normal palpated skin turgor. No nodularity.  Breasts: No masses or discharge.  Symmetric.  No axillary adenopathy.  Lungs: Clear to auscultation.No rales or wheezes. Normal Respiratory effort, no retractions.  Heart: NSR.  No murmurs or rubs appreciated. No peripheral edema  Abdomen: Soft.  Non-tender.  No masses.  No HSM. No hernia  Extremities: Moves all appropriately.  Normal ROM for age. No lymphadenopathy.  Neuro: Oriented to PPT.  Normal mood. Normal affect.     Pelvic:   Vulva: Normal appearance.  No lesions.  Vagina: No lesions or abnormalities noted.  Support: Normal pelvic support.  Urethra No masses tenderness or scarring.  Meatus Normal size without lesions or prolapse.  Cervix: Normal appearance.  No lesions.Friable. Pap collected.  Anus: Normal exam.  No lesions.  Perineum: Normal exam.  No lesions.        Bimanual   Uterus: 12-13 week size.  Non-tender.  Mobile.  AV.  Adnexae: No masses.  Non-tender to palpation.  Cul-de-sac: Negative for abnormality.    ASSESSMENT  1) Annual exam 2) Abnormal bleeding, enlarged uterus  PLAN 1) Physical exam as noted. Discussed healthy lifestyle choices and preventive care. STI swabs and blood work collected. Labs: TSH, A1C, lipid profile, CBC, CMP. Pap collected. Mammogram ordered. 2) Discussed possible causes of bleeding, including but not limited to infection, friable cervix, polyp, stress, ovarian cyst. Pelvic US ordered  Return in one year for annual exam or as needed for  concerns.   Guadlupe Spanish, CNM

## 2023-04-10 LAB — COMPREHENSIVE METABOLIC PANEL
ALT: 9 [IU]/L (ref 0–32)
AST: 16 [IU]/L (ref 0–40)
Albumin: 4.7 g/dL (ref 3.9–4.9)
Alkaline Phosphatase: 51 [IU]/L (ref 44–121)
BUN/Creatinine Ratio: 25 — ABNORMAL HIGH (ref 9–23)
BUN: 25 mg/dL — ABNORMAL HIGH (ref 6–24)
Bilirubin Total: 0.3 mg/dL (ref 0.0–1.2)
CO2: 20 mmol/L (ref 20–29)
Calcium: 9.5 mg/dL (ref 8.7–10.2)
Chloride: 103 mmol/L (ref 96–106)
Creatinine, Ser: 0.99 mg/dL (ref 0.57–1.00)
Globulin, Total: 2.7 g/dL (ref 1.5–4.5)
Glucose: 85 mg/dL (ref 70–99)
Potassium: 4.4 mmol/L (ref 3.5–5.2)
Sodium: 138 mmol/L (ref 134–144)
Total Protein: 7.4 g/dL (ref 6.0–8.5)
eGFR: 74 mL/min/{1.73_m2} (ref >59)

## 2023-04-10 LAB — CBC WITH DIFF/PLATELET
Basophils Absolute: 0.1 10*3/uL (ref 0.0–0.2)
Basos: 1 %
EOS (ABSOLUTE): 0.2 10*3/uL (ref 0.0–0.4)
Eos: 4 %
Hematocrit: 44.5 % (ref 34.0–46.6)
Hemoglobin: 14.9 g/dL (ref 11.1–15.9)
Immature Grans (Abs): 0 10*3/uL (ref 0.0–0.1)
Immature Granulocytes: 0 %
Lymphocytes Absolute: 2 10*3/uL (ref 0.7–3.1)
Lymphs: 38 %
MCH: 32.1 pg (ref 26.6–33.0)
MCHC: 33.5 g/dL (ref 31.5–35.7)
MCV: 96 fL (ref 79–97)
Monocytes Absolute: 0.4 10*3/uL (ref 0.1–0.9)
Monocytes: 8 %
Neutrophils Absolute: 2.6 10*3/uL (ref 1.4–7.0)
Neutrophils: 49 %
Platelets: 268 10*3/uL (ref 150–450)
RBC: 4.64 x10E6/uL (ref 3.77–5.28)
RDW: 12.3 % (ref 11.7–15.4)
WBC: 5.3 10*3/uL (ref 3.4–10.8)

## 2023-04-10 LAB — CERVICOVAGINAL ANCILLARY ONLY
Bacterial Vaginitis (gardnerella): NEGATIVE
Candida Glabrata: NEGATIVE
Candida Vaginitis: NEGATIVE
Chlamydia: NEGATIVE
Comment: NEGATIVE
Comment: NEGATIVE
Comment: NEGATIVE
Comment: NEGATIVE
Comment: NEGATIVE
Comment: NORMAL
Neisseria Gonorrhea: NEGATIVE
Trichomonas: NEGATIVE

## 2023-04-10 LAB — HEMOGLOBIN A1C
Est. average glucose Bld gHb Est-mCnc: 103 mg/dL
Hgb A1c MFr Bld: 5.2 % (ref 4.8–5.6)

## 2023-04-10 LAB — TSH: TSH: 1.52 u[IU]/mL (ref 0.450–4.500)

## 2023-04-10 LAB — LIPID PANEL
Chol/HDL Ratio: 3.7 ratio (ref 0.0–4.4)
Cholesterol, Total: 249 mg/dL — ABNORMAL HIGH (ref 100–199)
HDL: 68 mg/dL (ref >39)
LDL Chol Calc (NIH): 168 mg/dL — ABNORMAL HIGH (ref 0–99)
Triglycerides: 76 mg/dL (ref 0–149)
VLDL Cholesterol Cal: 13 mg/dL (ref 5–40)

## 2023-04-10 LAB — HIV ANTIBODY (ROUTINE TESTING W REFLEX): HIV Screen 4th Generation wRfx: NONREACTIVE

## 2023-04-10 LAB — HEPATITIS C ANTIBODY: Hep C Virus Ab: NONREACTIVE

## 2023-04-10 LAB — HEPATITIS B SURFACE ANTIBODY,QUALITATIVE: Hep B Surface Ab, Qual: REACTIVE

## 2023-04-10 LAB — RPR: RPR Ser Ql: NONREACTIVE

## 2023-04-10 LAB — HEPATITIS B SURFACE ANTIGEN: Hepatitis B Surface Ag: NEGATIVE

## 2023-04-13 LAB — CYTOLOGY - PAP
Comment: NEGATIVE
Diagnosis: NEGATIVE
High risk HPV: NEGATIVE

## 2023-04-14 ENCOUNTER — Ambulatory Visit: Payer: Managed Care, Other (non HMO)

## 2023-04-14 DIAGNOSIS — N939 Abnormal uterine and vaginal bleeding, unspecified: Secondary | ICD-10-CM | POA: Diagnosis not present

## 2023-04-16 ENCOUNTER — Encounter: Payer: Self-pay | Admitting: Obstetrics

## 2023-04-27 ENCOUNTER — Encounter: Payer: Self-pay | Admitting: Obstetrics

## 2023-11-06 NOTE — Telephone Encounter (Signed)
 Error
